# Patient Record
Sex: Male | Born: 1945 | Race: White | Hispanic: No | Smoking: Former smoker
Health system: Southern US, Community
[De-identification: ages and names within clinical notes are randomized; demographics above are authoritative.]

## PROBLEM LIST (undated history)

## (undated) DIAGNOSIS — I6521 Occlusion and stenosis of right carotid artery: Secondary | ICD-10-CM

## (undated) DIAGNOSIS — E785 Hyperlipidemia, unspecified: Secondary | ICD-10-CM

## (undated) DIAGNOSIS — E079 Disorder of thyroid, unspecified: Secondary | ICD-10-CM

## (undated) DIAGNOSIS — E039 Hypothyroidism, unspecified: Secondary | ICD-10-CM

## (undated) DIAGNOSIS — C801 Malignant (primary) neoplasm, unspecified: Secondary | ICD-10-CM

## (undated) DIAGNOSIS — K219 Gastro-esophageal reflux disease without esophagitis: Secondary | ICD-10-CM

## (undated) DIAGNOSIS — K2210 Ulcer of esophagus without bleeding: Secondary | ICD-10-CM

## (undated) DIAGNOSIS — I639 Cerebral infarction, unspecified: Secondary | ICD-10-CM

## (undated) DIAGNOSIS — J45909 Unspecified asthma, uncomplicated: Secondary | ICD-10-CM

## (undated) DIAGNOSIS — D649 Anemia, unspecified: Secondary | ICD-10-CM

## (undated) HISTORY — DX: Unspecified asthma, uncomplicated: J45.909

## (undated) HISTORY — DX: Hyperlipidemia, unspecified: E78.5

## (undated) HISTORY — DX: Hypothyroidism, unspecified: E03.9

## (undated) HISTORY — DX: Ulcer of esophagus without bleeding: K22.10

## (undated) HISTORY — PX: FRACTURE SURGERY: SHX138

## (undated) HISTORY — DX: Gastro-esophageal reflux disease without esophagitis: K21.9

## (undated) HISTORY — DX: Occlusion and stenosis of right carotid artery: I65.21

## (undated) HISTORY — DX: Disorder of thyroid, unspecified: E07.9

---

## 2001-04-19 ENCOUNTER — Emergency Department (HOSPITAL_COMMUNITY): Admission: EM | Admit: 2001-04-19 | Discharge: 2001-04-19 | Payer: Self-pay | Admitting: Emergency Medicine

## 2001-04-28 ENCOUNTER — Emergency Department (HOSPITAL_COMMUNITY): Admission: EM | Admit: 2001-04-28 | Discharge: 2001-04-28 | Payer: Self-pay | Admitting: Emergency Medicine

## 2007-10-19 ENCOUNTER — Emergency Department (HOSPITAL_COMMUNITY): Admission: EM | Admit: 2007-10-19 | Discharge: 2007-10-19 | Payer: Self-pay | Admitting: Emergency Medicine

## 2009-04-01 ENCOUNTER — Emergency Department (HOSPITAL_COMMUNITY): Admission: EM | Admit: 2009-04-01 | Discharge: 2009-04-01 | Payer: Self-pay | Admitting: Emergency Medicine

## 2011-01-22 LAB — POCT I-STAT, CHEM 8
BUN: 8 mg/dL (ref 6–23)
Calcium, Ion: 1.09 mmol/L — ABNORMAL LOW (ref 1.12–1.32)
Chloride: 103 mEq/L (ref 96–112)
Creatinine, Ser: 1 mg/dL (ref 0.4–1.5)
Glucose, Bld: 85 mg/dL (ref 70–99)
TCO2: 22 mmol/L (ref 0–100)

## 2011-01-22 LAB — D-DIMER, QUANTITATIVE: D-Dimer, Quant: <0.22 ug/mL-FEU (ref 0.00–0.48)

## 2011-07-05 LAB — CBC
MCHC: 35.9
MCV: 96.4
RDW: 12.4

## 2011-07-05 LAB — BASIC METABOLIC PANEL
BUN: 21
CO2: 25
Calcium: 8.8
Chloride: 92 — ABNORMAL LOW
Creatinine, Ser: 1.33
GFR calc Af Amer: >60
Glucose, Bld: 98

## 2011-07-05 LAB — DIFFERENTIAL
Basophils Absolute: 0
Basophils Relative: 0
Blasts: 0
Lymphocytes Relative: 36
Lymphs Abs: 1.4
Myelocytes: 0
Neutro Abs: 1.9
Neutrophils Relative %: 51
Promyelocytes Absolute: 0

## 2012-09-03 ENCOUNTER — Ambulatory Visit
Admission: RE | Admit: 2012-09-03 | Discharge: 2012-09-03 | Disposition: A | Discharge status: Home / Self Care | Payer: BC Managed Care – PPO | Source: Ambulatory Visit | Attending: Physician Assistant | Admitting: Physician Assistant

## 2012-09-03 ENCOUNTER — Other Ambulatory Visit: Payer: Self-pay | Admitting: Physician Assistant

## 2012-09-03 DIAGNOSIS — R52 Pain, unspecified: Secondary | ICD-10-CM

## 2013-09-22 ENCOUNTER — Ambulatory Visit (INDEPENDENT_AMBULATORY_CARE_PROVIDER_SITE_OTHER): Payer: BC Managed Care – PPO | Admitting: Family Medicine

## 2013-09-22 ENCOUNTER — Encounter: Payer: Self-pay | Admitting: Family Medicine

## 2013-09-22 VITALS — BP 110/82 | HR 68 | Temp 97.7°F | Resp 18 | Ht 63.0 in | Wt 124.0 lb

## 2013-09-22 DIAGNOSIS — K219 Gastro-esophageal reflux disease without esophagitis: Secondary | ICD-10-CM

## 2013-09-22 DIAGNOSIS — J42 Unspecified chronic bronchitis: Secondary | ICD-10-CM | POA: Insufficient documentation

## 2013-09-22 DIAGNOSIS — J45901 Unspecified asthma with (acute) exacerbation: Secondary | ICD-10-CM

## 2013-09-22 MED ORDER — METHYLPREDNISOLONE ACETATE 40 MG/ML IJ SUSP
40.0000 mg | Freq: Once | INTRAMUSCULAR | Status: AC
  Administered 2013-09-22: 40 mg via INTRAMUSCULAR

## 2013-09-22 MED ORDER — PREDNISONE 20 MG PO TABS: ORAL_TABLET | ORAL | Status: DC

## 2013-09-22 MED ORDER — ESOMEPRAZOLE MAGNESIUM 40 MG PO CPDR: 40.0000 mg | DELAYED_RELEASE_CAPSULE | Freq: Every day | ORAL | Status: DC

## 2013-09-22 MED ORDER — AZITHROMYCIN 250 MG PO TABS: ORAL_TABLET | ORAL | Status: DC

## 2013-09-22 MED ORDER — ALBUTEROL SULFATE HFA 108 (90 BASE) MCG/ACT IN AERS: 2.0000 | INHALATION_SPRAY | RESPIRATORY_TRACT | Status: DC | PRN

## 2013-09-22 NOTE — Progress Notes (Signed)
   Subjective:    Patient ID: Lee Wilson, male    DOB: 03-31-1946, 67 y.o.   MRN: [id number]  HPI  Patient here with cough and congestion for the past 3 weeks. He has a history of asthma. He quit smoking greater than 40 years ago. In the past he had an albuterol inhaler but has not had in the past couple of years. He denies any fever or shortness of breath.  He also has a history of acid reflux and asked that the purple pill be refilled for him. He is history of a hiatal hernia.   Review of Systems  GEN- denies fatigue, fever, weight loss,weakness, recent illness HEENT- denies eye drainage, change in vision, nasal discharge, CVS- denies chest pain, palpitations RESP- denies SOB, +cough,+ wheeze ABD- denies N/V, change in stools, abd pain GU- denies dysuria, hematuria, dribbling, incontinence      Objective:   Physical Exam  GEN- NAD, alert and oriented x3 HEENT- PERRL, EOMI, non injected sclera, pink conjunctiva, MMM, oropharynx clear Neck- Supple, no LAD CVS- RRR, no murmur RESP-+ bronchospasm,+wheeze, +ronchi bilat, normal WOB, harsh cough, oxygen sat 98%, no retractions ABD-NABS,soft,NT,ND EXT- No edema Pulses- Radial 2+  S/P neb- improved bronchospasm, good air movement       Assessment & Plan:

## 2013-09-22 NOTE — Addendum Note (Signed)
Addended by: Elvina Mattes T on: 09/22/2013 02:11 PM   Modules accepted: Orders

## 2013-09-22 NOTE — Assessment & Plan Note (Signed)
Nexium prescription given

## 2013-09-22 NOTE — Patient Instructions (Signed)
Start antibiotics today Start prednisone tomorrow Use inhaler every 4 hours  Note for work Nexium for the acid reflux Return if you do not improve I recommend you schedule a physical for 2015

## 2013-09-22 NOTE — Assessment & Plan Note (Signed)
Given Depo-Medrol IM injection. Status post neb improved in the clinic. He has normal oxygen sats. I will send him home with prednisone, azithromycin and albuterol inhaler.

## 2013-10-28 ENCOUNTER — Encounter: Payer: BC Managed Care – PPO | Admitting: Family Medicine

## 2013-11-24 ENCOUNTER — Encounter: Payer: Self-pay | Admitting: Family Medicine

## 2013-11-24 ENCOUNTER — Ambulatory Visit (INDEPENDENT_AMBULATORY_CARE_PROVIDER_SITE_OTHER): Payer: BC Managed Care – PPO | Admitting: Family Medicine

## 2013-11-24 VITALS — BP 100/82 | HR 64 | Temp 97.4°F | Resp 18 | Ht 63.0 in | Wt 128.0 lb

## 2013-11-24 DIAGNOSIS — Z0000 Encounter for general adult medical examination without abnormal findings: Secondary | ICD-10-CM

## 2013-11-24 DIAGNOSIS — Z125 Encounter for screening for malignant neoplasm of prostate: Secondary | ICD-10-CM

## 2013-11-24 LAB — CBC WITH DIFFERENTIAL/PLATELET
BASOS ABS: 0 10*3/uL (ref 0.0–0.1)
BASOS PCT: 1 % (ref 0–1)
EOS ABS: 0.2 10*3/uL (ref 0.0–0.7)
EOS PCT: 3 % (ref 0–5)
HEMATOCRIT: 41.3 % (ref 39.0–52.0)
Hemoglobin: 14.3 g/dL (ref 13.0–17.0)
Lymphocytes Relative: 44 % (ref 12–46)
Lymphs Abs: 2.2 10*3/uL (ref 0.7–4.0)
MCH: 35 pg — ABNORMAL HIGH (ref 26.0–34.0)
MCHC: 34.6 g/dL (ref 30.0–36.0)
MCV: 101 fL — ABNORMAL HIGH (ref 78.0–100.0)
MONO ABS: 0.6 10*3/uL (ref 0.1–1.0)
Monocytes Relative: 12 % (ref 3–12)
NEUTROS ABS: 2.1 10*3/uL (ref 1.7–7.7)
Neutrophils Relative %: 40 % — ABNORMAL LOW (ref 43–77)
Platelets: 220 10*3/uL (ref 150–400)
RBC: 4.09 MIL/uL — ABNORMAL LOW (ref 4.22–5.81)
RDW: 14.1 % (ref 11.5–15.5)
WBC: 5.1 10*3/uL (ref 4.0–10.5)

## 2013-11-24 LAB — LIPID PANEL
CHOL/HDL RATIO: 2.9 ratio
Cholesterol: 216 mg/dL — ABNORMAL HIGH (ref 0–200)
HDL: 74 mg/dL (ref >39)
LDL CALC: 119 mg/dL — AB (ref 0–99)
Triglycerides: 114 mg/dL (ref <150)
VLDL: 23 mg/dL (ref 0–40)

## 2013-11-24 LAB — COMPREHENSIVE METABOLIC PANEL
ALK PHOS: 49 U/L (ref 39–117)
ALT: 13 U/L (ref 0–53)
AST: 22 U/L (ref 0–37)
Albumin: 4 g/dL (ref 3.5–5.2)
BILIRUBIN TOTAL: 0.4 mg/dL (ref 0.2–1.2)
BUN: 9 mg/dL (ref 6–23)
CO2: 25 mEq/L (ref 19–32)
CREATININE: 0.82 mg/dL (ref 0.50–1.35)
Calcium: 9 mg/dL (ref 8.4–10.5)
Chloride: 100 mEq/L (ref 96–112)
Glucose, Bld: 80 mg/dL (ref 70–99)
POTASSIUM: 4.6 meq/L (ref 3.5–5.3)
Sodium: 137 mEq/L (ref 135–145)
Total Protein: 7.1 g/dL (ref 6.0–8.3)

## 2013-11-24 LAB — PSA: PSA: 0.86 ng/mL (ref <=4.00)

## 2013-11-24 MED ORDER — ALBUTEROL SULFATE HFA 108 (90 BASE) MCG/ACT IN AERS: 2.0000 | INHALATION_SPRAY | RESPIRATORY_TRACT | Status: DC | PRN

## 2013-11-24 NOTE — Progress Notes (Signed)
Patient ID: Lee Wilson, male   DOB: 02-20-1946, 68 y.o.   MRN: [id number]   Subjective:    Patient ID: Lee Wilson, male    DOB: 1946/04/17, 68 y.o.   MRN: [id number]  Patient presents for Annual Exam  he has no specific concerns. He was recently seen for asthmatic bronchitis exacerbation which is now improved. He rarely uses the albuterol. He declines colonoscopy as well as immunizations  He agrees to fasting blood work today. His history was reviewed and there've been no changes.    Review Of Systems:  GEN- denies fatigue, fever, weight loss,weakness, recent illness HEENT- denies eye drainage, change in vision, nasal discharge, CVS- denies chest pain, palpitations RESP- denies SOB, cough, wheeze ABD- denies N/V, change in stools, abd pain GU- denies dysuria, hematuria, dribbling, incontinence MSK- denies joint pain, muscle aches, injury Neuro- denies headache, dizziness, syncope, seizure activity       Objective:    BP 100/82  Pulse 64  Temp(Src) 97.4 F (36.3 C) (Oral)  Resp 18  Ht 5\' 3"  (1.6 m)  Wt 128 lb (58.06 kg)  BMI 22.68 kg/m2  SpO2 97% GEN- NAD, alert and oriented x3 HEENT- PERRL, EOMI, non injected sclera, pink conjunctiva, MMM, oropharynx clear, TM clear bilat no effusion Neck- Supple, CVS- RRR, no murmur RESP-CTAB ABD-NABS,soft,NT,ND GU- rectum normal tone, no external lesions, FOBT NEG, prostate smooth mild enlargement no nodules EXT- No edema Pulses- Radial, DP- 2+        Assessment & Plan:      Problem List Items Addressed This Visit   None    Visit Diagnoses   Routine general medical examination at a health care facility    -  Primary . Patient declines colonoscopy but agrees to do the stool cards. His fasting labs were done today. We did discuss PSA testing and he agreed to proceed. He declined any immunizations.he states that he prefers to keep things simple and if he gets sick he will come in     Relevant Orders       CBC with  Differential       Comprehensive metabolic panel       Lipid panel    Prostate cancer screening        Relevant Orders       PSA       Note: This dictation was prepared with Dragon dictation along with smaller phrase technology. Any transcriptional errors that result from this process are unintentional.

## 2013-11-24 NOTE — Patient Instructions (Signed)
F/u AS NEEDED or in 1 year We will send a letter with lab results Bring the stools cards back

## 2013-11-26 ENCOUNTER — Encounter: Payer: Self-pay | Admitting: *Deleted

## 2015-01-13 ENCOUNTER — Telehealth: Payer: Self-pay | Admitting: *Deleted

## 2015-01-13 MED ORDER — ESOMEPRAZOLE MAGNESIUM 40 MG PO CPDR
40.0000 mg | DELAYED_RELEASE_CAPSULE | Freq: Every day | ORAL | Status: DC
Start: 2015-01-13 — End: 2015-01-14

## 2015-01-13 MED ORDER — ALBUTEROL SULFATE HFA 108 (90 BASE) MCG/ACT IN AERS: 2.0000 | INHALATION_SPRAY | RESPIRATORY_TRACT | Status: DC | PRN

## 2015-01-13 NOTE — Telephone Encounter (Signed)
Received call from patient sister in law.   Reports that patient has a lot of nasal congestion and cough. States that he is having some wheezing. Reports that he does not have inhaler.   Prescription sent to pharmacy.   Appointment scheduled.

## 2015-01-14 ENCOUNTER — Ambulatory Visit (INDEPENDENT_AMBULATORY_CARE_PROVIDER_SITE_OTHER): Payer: BLUE CROSS/BLUE SHIELD | Admitting: Family Medicine

## 2015-01-14 ENCOUNTER — Encounter: Payer: Self-pay | Admitting: Family Medicine

## 2015-01-14 VITALS — BP 110/68 | HR 80 | Temp 97.7°F | Resp 18 | Wt 124.0 lb

## 2015-01-14 DIAGNOSIS — Z23 Encounter for immunization: Secondary | ICD-10-CM

## 2015-01-14 DIAGNOSIS — Z0000 Encounter for general adult medical examination without abnormal findings: Secondary | ICD-10-CM

## 2015-01-14 DIAGNOSIS — J449 Chronic obstructive pulmonary disease, unspecified: Secondary | ICD-10-CM

## 2015-01-14 LAB — CBC WITH DIFFERENTIAL/PLATELET
BASOS ABS: 0.1 10*3/uL (ref 0.0–0.1)
BASOS PCT: 1 % (ref 0–1)
EOS ABS: 0.2 10*3/uL (ref 0.0–0.7)
Eosinophils Relative: 3 % (ref 0–5)
HCT: 39.2 % (ref 39.0–52.0)
Hemoglobin: 13.7 g/dL (ref 13.0–17.0)
LYMPHS PCT: 46 % (ref 12–46)
Lymphs Abs: 2.3 10*3/uL (ref 0.7–4.0)
MCH: 35 pg — AB (ref 26.0–34.0)
MCHC: 34.9 g/dL (ref 30.0–36.0)
MCV: 100.3 fL — ABNORMAL HIGH (ref 78.0–100.0)
MPV: 9 fL (ref 8.6–12.4)
Monocytes Absolute: 0.5 10*3/uL (ref 0.1–1.0)
Monocytes Relative: 10 % (ref 3–12)
NEUTROS ABS: 2 10*3/uL (ref 1.7–7.7)
Neutrophils Relative %: 40 % — ABNORMAL LOW (ref 43–77)
PLATELETS: 207 10*3/uL (ref 150–400)
RBC: 3.91 MIL/uL — AB (ref 4.22–5.81)
RDW: 13.4 % (ref 11.5–15.5)
WBC: 5 10*3/uL (ref 4.0–10.5)

## 2015-01-14 MED ORDER — ESOMEPRAZOLE MAGNESIUM 40 MG PO CPDR: 40.0000 mg | DELAYED_RELEASE_CAPSULE | Freq: Every day | ORAL | Status: DC

## 2015-01-14 MED ORDER — ALBUTEROL SULFATE HFA 108 (90 BASE) MCG/ACT IN AERS: 2.0000 | INHALATION_SPRAY | RESPIRATORY_TRACT | Status: DC | PRN

## 2015-01-14 NOTE — Progress Notes (Signed)
   Subjective:    Patient ID: Lee Wilson, male    DOB: 10-11-1946, 69 y.o.   MRN: [id number]  HPI Here today only because he wants a refill on his albuterol. We would not refill the patient's medication without him being seen. He denies any complaints. He uses the albuterol to 3 times per month. He responds rapidly to the albuterol. He does have a history of asthma. He also has a history of tobacco abuse. He has quit smoking. Patient also takes Nexium as needed for acid reflux. His symptoms are well controlled. The remainder of his review of systems is negative. He denies any chest pain or shortness of breath or dyspnea on exertion. He denies any nausea vomiting or diarrhea. He denies any melena. He is overdue for a prostate exam. He is overdue for a colonoscopy. He refuses a colonoscopy. He refuses a digital rectal exam. He will consent to fasting lab work. He is willing to allow me to check his PSA. He is also due for vaccinations. He is very hesitant but eventually agrees to allow me to give him Pneumovax 23   Past Medical History  Diagnosis Date  . Asthma   . GERD (gastroesophageal reflux disease)    Past Surgical History  Procedure Laterality Date  . Fracture surgery     No current outpatient prescriptions on file prior to visit.   No current facility-administered medications on file prior to visit.   No Known Allergies History   Social History  . Marital Status: Married    Spouse Name: N/A  . Number of Children: N/A  . Years of Education: N/A   Occupational History  . Not on file.   Social History Main Topics  . Smoking status: Former Research scientist (life sciences)  . Smokeless tobacco: Not on file  . Alcohol Use: Not on file  . Drug Use: Not on file  . Sexual Activity: Not on file   Other Topics Concern  . Not on file   Social History Narrative    Review of Systems  All other systems reviewed and are negative.      Objective:   Physical Exam  Constitutional: He appears  well-developed and well-nourished.  Eyes: Conjunctivae are normal. No scleral icterus.  Neck: Neck supple. No JVD present. No thyromegaly present.  Cardiovascular: Normal rate, regular rhythm and normal heart sounds.   Pulmonary/Chest: Effort normal. He has wheezes.  Abdominal: Soft. Bowel sounds are normal. He exhibits no distension. There is no tenderness. There is no rebound.  Musculoskeletal: He exhibits no edema.  Lymphadenopathy:    He has no cervical adenopathy.  Vitals reviewed.         Assessment & Plan:  Routine general medical examination at a health care facility - Plan: CBC with Differential/Platelet, COMPLETE METABOLIC PANEL WITH GFR, PSA, Medicare, Lipid panel, Pneumococcal polysaccharide vaccine 23-valent greater than or equal to 2yo subcutaneous/IM  Need for prophylactic vaccination against Streptococcus pneumoniae (pneumococcus) - Plan: Pneumococcal polysaccharide vaccine 23-valent greater than or equal to 2yo subcutaneous/IM  Chronic obstructive airway disease with asthma  Patient refuses a digital rectal exam. He will allow me to check a PSA. I will also check a CBC, CMP, fasting lipid panel. Also gave the patient Pneumovax 23. Patient refuses a colonoscopy. I did refill his albuterol as requested. Currently his asthma is adequately controlled and he uses the albuterol relatively infrequently.

## 2015-01-15 LAB — COMPLETE METABOLIC PANEL WITH GFR
ALT: 14 U/L (ref 0–53)
AST: 29 U/L (ref 0–37)
Albumin: 4.2 g/dL (ref 3.5–5.2)
Alkaline Phosphatase: 59 U/L (ref 39–117)
BILIRUBIN TOTAL: 0.5 mg/dL (ref 0.2–1.2)
BUN: 5 mg/dL — ABNORMAL LOW (ref 6–23)
CO2: 23 meq/L (ref 19–32)
CREATININE: 0.77 mg/dL (ref 0.50–1.35)
Calcium: 9.1 mg/dL (ref 8.4–10.5)
Chloride: 93 mEq/L — ABNORMAL LOW (ref 96–112)
GLUCOSE: 85 mg/dL (ref 70–99)
Potassium: 4 mEq/L (ref 3.5–5.3)
SODIUM: 129 meq/L — AB (ref 135–145)
Total Protein: 7.5 g/dL (ref 6.0–8.3)

## 2015-01-15 LAB — PSA, MEDICARE: PSA: 1.13 ng/mL (ref <=4.00)

## 2015-01-15 LAB — LIPID PANEL
CHOL/HDL RATIO: 1.9 ratio
Cholesterol: 180 mg/dL (ref 0–200)
HDL: 94 mg/dL (ref >=40)
LDL Cholesterol: 72 mg/dL (ref 0–99)
Triglycerides: 71 mg/dL (ref <150)
VLDL: 14 mg/dL (ref 0–40)

## 2015-02-23 ENCOUNTER — Encounter: Payer: Self-pay | Admitting: Family Medicine

## 2015-07-12 ENCOUNTER — Ambulatory Visit (INDEPENDENT_AMBULATORY_CARE_PROVIDER_SITE_OTHER): Payer: BLUE CROSS/BLUE SHIELD | Admitting: Physician Assistant

## 2015-07-12 VITALS — BP 108/70 | HR 74 | Temp 98.5°F | Resp 16 | Ht 63.0 in | Wt 125.2 lb

## 2015-07-12 DIAGNOSIS — L03119 Cellulitis of unspecified part of limb: Secondary | ICD-10-CM

## 2015-07-12 DIAGNOSIS — M25522 Pain in left elbow: Secondary | ICD-10-CM | POA: Diagnosis not present

## 2015-07-12 MED ORDER — DOXYCYCLINE HYCLATE 100 MG PO TABS: 100.0000 mg | ORAL_TABLET | Freq: Two times a day (BID) | ORAL | Status: DC

## 2015-07-12 NOTE — Progress Notes (Signed)
   Lee Wilson  MRN: [id number] DOB: 29-Jun-1946  Subjective:  Pt presents to clinic with elbow injury that happened 2 weeks ago but now the elbow is red and swollen and has pus coming from it.  He fell and scraped his elbow on a concrete surface about 2 weeks ago and thought it was going to get better but it started to swell more and become more painful.  Then a few days ago it got a "goose egg" on it and then it popped and yellow pus came out of it.  He has done nothing to the area but keep it covered.  It has not seemed to get better since it popped a few days ago.  Patient Active Problem List   Diagnosis Date Noted  . Asthmatic bronchitis with exacerbation 09/22/2013  . GERD (gastroesophageal reflux disease) 09/22/2013    Current Outpatient Prescriptions on File Prior to Visit  Medication Sig Dispense Refill  . esomeprazole (NEXIUM) 40 MG capsule Take 1 capsule (40 mg total) by mouth daily. 30 capsule 3  . albuterol (PROVENTIL HFA;VENTOLIN HFA) 108 (90 BASE) MCG/ACT inhaler Inhale 2 puffs into the lungs every 4 (four) hours as needed for wheezing or shortness of breath. (Patient not taking: Reported on 07/12/2015) 1 Inhaler 3   No current facility-administered medications on file prior to visit.    No Known Allergies  Review of Systems  Constitutional: Negative for fever and chills.  Skin: Positive for wound.   Objective:  BP 108/70 mmHg  Pulse 74  Temp(Src) 98.5 F (36.9 C) (Oral)  Resp 16  Ht 5\' 3"  (1.6 m)  Wt 125 lb 3.2 oz (56.79 kg)  BMI 22.18 kg/m2  SpO2 96%  Physical Exam  Constitutional: He is oriented to person, place, and time and well-developed, well-nourished, and in no distress.  HENT:  Head: Normocephalic and atraumatic.  Right Ear: External ear normal.  Left Ear: External ear normal.  Eyes: Conjunctivae are normal.  Neck: Normal range of motion.  Pulmonary/Chest: Effort normal.  Neurological: He is alert and oriented to person, place, and time. Gait  normal.  Skin: Skin is warm and dry.     Psychiatric: Mood, memory, affect and judgment normal.    Assessment and Plan :  Cellulitis of elbow - Plan: Wound culture, doxycycline (VIBRA-TABS) 100 MG tablet  Elbow pain, left   warm compresses, tylenol motrin prn - recheck in 72h if not better - keep covered - finish all abx  Windell Hummingbird PA-C  [facility] 07/12/2015 8:41 AM

## 2015-07-12 NOTE — Patient Instructions (Signed)
Warm compresses to help pull out the pus.  Take your antibiotic 2 times a day until they are all gone.

## 2015-07-14 LAB — WOUND CULTURE: Gram Stain: NONE SEEN

## 2016-01-12 ENCOUNTER — Ambulatory Visit (INDEPENDENT_AMBULATORY_CARE_PROVIDER_SITE_OTHER): Payer: Medicare Other | Admitting: Physician Assistant

## 2016-01-12 ENCOUNTER — Encounter: Payer: Self-pay | Admitting: Physician Assistant

## 2016-01-12 VITALS — BP 126/76 | HR 60 | Temp 97.9°F | Resp 20 | Wt 123.0 lb

## 2016-01-12 DIAGNOSIS — J309 Allergic rhinitis, unspecified: Secondary | ICD-10-CM

## 2016-01-12 DIAGNOSIS — J45901 Unspecified asthma with (acute) exacerbation: Secondary | ICD-10-CM | POA: Diagnosis not present

## 2016-01-12 MED ORDER — ESOMEPRAZOLE MAGNESIUM 40 MG PO CPDR: 40.0000 mg | DELAYED_RELEASE_CAPSULE | Freq: Every day | ORAL | Status: DC

## 2016-01-12 MED ORDER — ALBUTEROL SULFATE HFA 108 (90 BASE) MCG/ACT IN AERS: 2.0000 | INHALATION_SPRAY | RESPIRATORY_TRACT | Status: DC | PRN

## 2016-01-12 MED ORDER — CETIRIZINE HCL 10 MG PO TABS: 10.0000 mg | ORAL_TABLET | Freq: Every day | ORAL | Status: DC

## 2016-01-12 MED ORDER — PREDNISONE 20 MG PO TABS: 20.0000 mg | ORAL_TABLET | Freq: Every day | ORAL | Status: DC

## 2016-01-12 NOTE — Progress Notes (Signed)
Patient ID: Lee Wilson MRN: [id number], DOB: February 09, 1946, 70 y.o. Date of Encounter: 01/12/2016, 10:37 AM    Chief Complaint:  Chief Complaint  Patient presents with  . prob with asthma x 3 weeks    out of MDI     HPI: 70 y.o. year old male presents with above.   States that he has coughed up no phlegm at all. States that he does not feel congestion in his head or nose. Says that just water runs out of his nose. Says that if he is in certain places he has sneezing. Says that he has been having cough. Says that he has been out of his inhaler for a year and has had no inhaler for at least a year. Feels like he is wheezing at times. As had no fevers or chills. No sore throat. No other complaints or concerns.     Home Meds:   Outpatient Prescriptions Prior to Visit  Medication Sig Dispense Refill  . albuterol (PROVENTIL HFA;VENTOLIN HFA) 108 (90 BASE) MCG/ACT inhaler Inhale 2 puffs into the lungs every 4 (four) hours as needed for wheezing or shortness of breath. (Patient not taking: Reported on 01/12/2016) 1 Inhaler 3  . doxycycline (VIBRA-TABS) 100 MG tablet Take 1 tablet (100 mg total) by mouth 2 (two) times daily. 20 tablet 0  . esomeprazole (NEXIUM) 40 MG capsule Take 1 capsule (40 mg total) by mouth daily. (Patient not taking: Reported on 01/12/2016) 30 capsule 3   No facility-administered medications prior to visit.    Allergies: No Known Allergies    Review of Systems: See HPI for pertinent ROS. All other ROS negative.    Physical Exam: Blood pressure 126/76, pulse 60, temperature 97.9 F (36.6 C), temperature source Oral, resp. rate 20, weight 123 lb (55.792 kg), SpO2 98 %., Body mass index is 21.79 kg/(m^2). General:  Thin WM. Appears in no acute distress. HEENT: Normocephalic, atraumatic, eyes without discharge, sclera non-icteric, nares are without discharge. Bilateral auditory canals clear, TM's are without perforation, pearly grey and translucent with  reflective cone of light bilaterally. Oral cavity moist, posterior pharynx without exudate, erythema, peritonsillar abscess.  Neck: Supple. No thyromegaly. No lymphadenopathy. Lungs: Very faint/very slight wheeze at left upper lung. Otherwise lungs are clear with good breath sounds and good air movement throughout. Oxygen saturation 98% on room air. Heart: Regular rhythm. No murmurs, rubs, or gallops. Msk:  Strength and tone normal for age. Extremities/Skin: Warm and dry.  Neuro: Alert and oriented X 3. Moves all extremities spontaneously. Gait is normal. CNII-XII grossly in tact. Psych:  Responds to questions appropriately with a normal affect.     ASSESSMENT AND PLAN:  70 y.o. year old male with  1. Asthma with exacerbation, unspecified asthma severity - albuterol (PROVENTIL HFA;VENTOLIN HFA) 108 (90 Base) MCG/ACT inhaler; Inhale 2 puffs into the lungs every 4 (four) hours as needed for wheezing or shortness of breath.  Dispense: 1 Inhaler; Refill: 3 - predniSONE (DELTASONE) 20 MG tablet; Take 1 tablet (20 mg total) by mouth daily with breakfast.  Dispense: 5 tablet; Refill: 0  2. Allergic rhinitis, unspecified allergic rhinitis type - cetirizine (ZYRTEC) 10 MG tablet; Take 1 tablet (10 mg total) by mouth daily.  Dispense: 30 tablet; Refill: 11  He is to take Zyrtec daily right now and use throughout the year when having an allergic rhinitis symptoms. He is to take the prednisone 20 mg daily for 5 days. He is to use the albuterol as  directed above. He is to follow-up with [place] if symptoms worsen or if symptoms do not return to normal baseline in one week.   Signed, [address] [place], [place], [facility] 01/12/2016 10:37 AM

## 2016-07-15 DIAGNOSIS — S36029A Unspecified contusion of spleen, initial encounter: Secondary | ICD-10-CM | POA: Diagnosis not present

## 2016-07-15 DIAGNOSIS — M79622 Pain in left upper arm: Secondary | ICD-10-CM | POA: Diagnosis not present

## 2016-07-15 DIAGNOSIS — I6501 Occlusion and stenosis of right vertebral artery: Secondary | ICD-10-CM | POA: Diagnosis not present

## 2016-07-15 DIAGNOSIS — T148XXA Other injury of unspecified body region, initial encounter: Secondary | ICD-10-CM | POA: Diagnosis not present

## 2016-07-15 DIAGNOSIS — M546 Pain in thoracic spine: Secondary | ICD-10-CM | POA: Diagnosis not present

## 2016-07-15 DIAGNOSIS — S2232XA Fracture of one rib, left side, initial encounter for closed fracture: Secondary | ICD-10-CM | POA: Diagnosis not present

## 2016-07-15 DIAGNOSIS — R55 Syncope and collapse: Secondary | ICD-10-CM | POA: Diagnosis not present

## 2016-07-15 DIAGNOSIS — M545 Low back pain: Secondary | ICD-10-CM | POA: Diagnosis not present

## 2016-07-15 DIAGNOSIS — S12400A Unspecified displaced fracture of fifth cervical vertebra, initial encounter for closed fracture: Secondary | ICD-10-CM | POA: Diagnosis not present

## 2016-07-16 DIAGNOSIS — S12500A Unspecified displaced fracture of sixth cervical vertebra, initial encounter for closed fracture: Secondary | ICD-10-CM | POA: Insufficient documentation

## 2016-07-16 DIAGNOSIS — W1789XA Other fall from one level to another, initial encounter: Secondary | ICD-10-CM | POA: Insufficient documentation

## 2016-07-16 DIAGNOSIS — S12690A Other displaced fracture of seventh cervical vertebra, initial encounter for closed fracture: Secondary | ICD-10-CM | POA: Diagnosis not present

## 2016-07-16 DIAGNOSIS — S36029A Unspecified contusion of spleen, initial encounter: Secondary | ICD-10-CM | POA: Insufficient documentation

## 2016-07-16 DIAGNOSIS — S22019A Unspecified fracture of first thoracic vertebra, initial encounter for closed fracture: Secondary | ICD-10-CM | POA: Insufficient documentation

## 2016-07-16 DIAGNOSIS — S36039A Unspecified laceration of spleen, initial encounter: Secondary | ICD-10-CM | POA: Diagnosis not present

## 2016-07-16 DIAGNOSIS — S12600A Unspecified displaced fracture of seventh cervical vertebra, initial encounter for closed fracture: Secondary | ICD-10-CM | POA: Insufficient documentation

## 2016-07-16 DIAGNOSIS — S22018A Other fracture of first thoracic vertebra, initial encounter for closed fracture: Secondary | ICD-10-CM | POA: Diagnosis not present

## 2016-07-16 DIAGNOSIS — S12590A Other displaced fracture of sixth cervical vertebra, initial encounter for closed fracture: Secondary | ICD-10-CM | POA: Diagnosis not present

## 2016-07-16 DIAGNOSIS — S12490A Other displaced fracture of fifth cervical vertebra, initial encounter for closed fracture: Secondary | ICD-10-CM | POA: Diagnosis not present

## 2016-07-16 DIAGNOSIS — F10929 Alcohol use, unspecified with intoxication, unspecified: Secondary | ICD-10-CM | POA: Insufficient documentation

## 2016-07-16 HISTORY — DX: Other fall from one level to another, initial encounter: W17.89XA

## 2016-07-25 ENCOUNTER — Ambulatory Visit (INDEPENDENT_AMBULATORY_CARE_PROVIDER_SITE_OTHER): Payer: Medicare Other | Admitting: Family Medicine

## 2016-07-25 ENCOUNTER — Encounter: Payer: Self-pay | Admitting: Family Medicine

## 2016-07-25 VITALS — BP 128/78 | HR 86 | Temp 98.5°F | Resp 14 | Ht 63.0 in | Wt 116.0 lb

## 2016-07-25 DIAGNOSIS — I679 Cerebrovascular disease, unspecified: Secondary | ICD-10-CM | POA: Diagnosis not present

## 2016-07-25 DIAGNOSIS — M899 Disorder of bone, unspecified: Secondary | ICD-10-CM | POA: Diagnosis not present

## 2016-07-25 DIAGNOSIS — S22018A Other fracture of first thoracic vertebra, initial encounter for closed fracture: Secondary | ICD-10-CM | POA: Diagnosis not present

## 2016-07-25 DIAGNOSIS — I779 Disorder of arteries and arterioles, unspecified: Secondary | ICD-10-CM

## 2016-07-25 DIAGNOSIS — K2100 Gastro-esophageal reflux disease with esophagitis, without bleeding: Secondary | ICD-10-CM

## 2016-07-25 DIAGNOSIS — S129XXD Fracture of neck, unspecified, subsequent encounter: Secondary | ICD-10-CM

## 2016-07-25 DIAGNOSIS — S36029D Unspecified contusion of spleen, subsequent encounter: Secondary | ICD-10-CM

## 2016-07-25 DIAGNOSIS — I739 Peripheral vascular disease, unspecified: Secondary | ICD-10-CM

## 2016-07-25 DIAGNOSIS — Z23 Encounter for immunization: Secondary | ICD-10-CM | POA: Diagnosis not present

## 2016-07-25 MED ORDER — DOCUSATE SODIUM 100 MG PO CAPS: 100.0000 mg | ORAL_CAPSULE | Freq: Two times a day (BID) | ORAL | 1 refills | Status: DC

## 2016-07-25 MED ORDER — ALBUTEROL SULFATE HFA 108 (90 BASE) MCG/ACT IN AERS: 2.0000 | INHALATION_SPRAY | RESPIRATORY_TRACT | 3 refills | Status: DC | PRN

## 2016-07-25 NOTE — Assessment & Plan Note (Addendum)
Multiple fractures including spleen hematoma. He is in need repeat CT scan of his abdomen to reevaluate his spleen I would say in about 4-6 weeks. He is following up with neurosurgery. Since his pain is not controlled I think this because he has not been taking as prescribed. I will change him to 7.5 mg 3 times a day he's been used the medication that he currently has said he can take 1-1/2 tablets as needed. His son is staying with him. Stool softener added

## 2016-07-25 NOTE — Patient Instructions (Addendum)
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

## 2016-07-25 NOTE — Progress Notes (Signed)
Subjective:    Patient ID: Lee Wilson, male    DOB: 05/26/1946, 70 y.o.   MRN: [id number]  Patient presents for Hospital F/U (S/P fall frm roof- spinal fx)  Patient here for hospital follow-up. He was seen at the emergency room at [facility] after he fell about 10 feet from his roof he was intoxicated ETOH 134 have mild lactic acidemia (2.2) UDS Negative. He sustained cervical fractures at C5 and C6 and endplate fracture at C7 and lamina fracture at the right of C6. He had a CT angiogram which also revealed high-grade stenosis as well as 70-80% blockage in the right carotid. ET tube head showed high left parietal scalp hematoma no evidence of any fracture no acute hemorrhage noted, it was also noted that he had an infarct in his right posterior frontal lobe indeterminate age  Other injuries include first rib fracture nondisplaced, age indeterminate compression fracture at T1 splenic subcapsular hematoma grade 2, left humeral diaphysis was incompletely evaluated but appeared to have numerous lytic lesions within it. He also had some mural thickening in his esophagus with stranding into the mediastinum concerning for possible esophagitis or other pathology.  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 He did not require any surgical intervention. He is prescribed Percocet 1 tablet every 6 hours as needed for pain he was continued on his albuterol for his asthma and omeprazole. He was given a weaning for his pain his Percocet one tablet every 6 hours for the first week then he is to spread to every 8 hours for the second week and every 12 hours for the third week he was given a prescription for 63 pills of Percocet on 07/16/2016 He is to follow-up with neurosurgery in 4 weeks he is supposed to be wearing a hard cervical collar until then. Neurosurgeon is Dr. Harl Bowie  He states that he is  in pain all day. He's only been taking the Percocet twice a day since he received it. He is unable to sleep with the neck brace. He is here today with his daughter Review Of Systems:  GEN- denies fatigue, fever, weight loss,weakness, recent illness HEENT- denies eye drainage, change in vision, nasal discharge, CVS- denies chest pain, palpitations RESP- denies SOB, cough, wheeze ABD- denies N/V, change in stools, abd pain GU- denies dysuria, hematuria, dribbling, incontinence MSK- +joint pain, muscle aches, injury Neuro- denies headache, dizziness, syncope, seizure activity       Objective:    BP 128/78 (BP Location: Left Arm, Patient Position: Sitting, Cuff Size: Normal)   Pulse 86   Temp 98.5 F (36.9 C) (Oral)   Resp 14   Ht 5\' 3"  (1.6 m)   Wt 116 lb (52.6 kg)   BMI 20.55 kg/m  GEN- NAD, alert and oriented x3 HEENT- PERRL, EOMI, non injected sclera, pink conjunctiva, MMM, oropharynx clear Neck- Supple, neck in collar  CVS- RRR, no murmur RESP-bilateral wheeze, normal WOB, no rales  ABD-NABS,soft,NT,ND EXT- No edema Pulses- Radial  2+        Assessment & Plan:      Problem List Items Addressed This Visit    T1 vertebral fracture (HCC)    Multiple fractures including spleen hematoma. He is in need repeat CT scan of his abdomen to reevaluate his spleen I would say in about 4-6 weeks. He is following up with neurosurgery. Since his pain is not controlled  I think this because he has not been taking as prescribed. I will change him to 7.5 mg 3 times a day he's been used the medication that he currently has said he can take 1-1/2 tablets as needed. His son is staying with him.      Spleen hematoma   Lytic bone lesions on xray    Needs xray of humerous in near future, evaluation for the lytic lesions seen Discussed the importance of following up with our office to ensure that he gets the proper care that he needs. Once he is cleared by surgery for his traumatic spinal  fractures we can proceed with the workup for the above.      GERD (gastroesophageal reflux disease)    Continue with his omeprazole he is going to need EGD performed with the esophageal thickening possibility of Barrett's or other malignant pathology      Relevant Medications   docusate sodium (COLACE) 100 MG capsule   Cerebrovascular disease or lesion    Unknown duration      Carotid artery disease ([place])    He will eventually need vascular consult once he is out of his neck brace. I will also have him come back in for cholesterol check and will plan to start statin drug once he's been clear from these traumatic injuries. Discussed the importance of not drinking       Other Visit Diagnoses    Closed fracture of multiple cervical vertebrae, subsequent encounter    -  Primary   Need for prophylactic vaccination against Streptococcus pneumoniae (pneumococcus)          Note: This dictation was prepared with Dragon dictation along with smaller phrase technology. Any transcriptional errors that result from this process are unintentional.

## 2016-07-25 NOTE — Assessment & Plan Note (Signed)
Needs xray of humerous in near future, evaluation for the lytic lesions seen Discussed the importance of following up with our office to ensure that he gets the proper care that he needs. Once he is cleared by surgery for his traumatic spinal fractures we can proceed with the workup for the above.

## 2016-07-25 NOTE — Assessment & Plan Note (Signed)
Unknown duration 

## 2016-07-25 NOTE — Assessment & Plan Note (Signed)
Continue with his omeprazole he is going to need EGD performed with the esophageal thickening possibility of Barrett's or other malignant pathology

## 2016-07-25 NOTE — Assessment & Plan Note (Signed)
He will eventually need vascular consult once he is out of his neck brace. I will also have him come back in for cholesterol check and will plan to start statin drug once he's been clear from these traumatic injuries. Discussed the importance of not drinking

## 2016-08-16 DIAGNOSIS — S12591D Other nondisplaced fracture of sixth cervical vertebra, subsequent encounter for fracture with routine healing: Secondary | ICD-10-CM | POA: Diagnosis not present

## 2016-08-16 DIAGNOSIS — R29898 Other symptoms and signs involving the musculoskeletal system: Secondary | ICD-10-CM | POA: Diagnosis not present

## 2016-08-16 DIAGNOSIS — S12400G Unspecified displaced fracture of fifth cervical vertebra, subsequent encounter for fracture with delayed healing: Secondary | ICD-10-CM | POA: Diagnosis not present

## 2016-08-16 DIAGNOSIS — R531 Weakness: Secondary | ICD-10-CM | POA: Diagnosis not present

## 2016-08-16 DIAGNOSIS — Z87891 Personal history of nicotine dependence: Secondary | ICD-10-CM | POA: Diagnosis not present

## 2016-08-16 DIAGNOSIS — S12490G Other displaced fracture of fifth cervical vertebra, subsequent encounter for fracture with delayed healing: Secondary | ICD-10-CM | POA: Diagnosis not present

## 2016-08-16 DIAGNOSIS — S12601G Unspecified nondisplaced fracture of seventh cervical vertebra, subsequent encounter for fracture with delayed healing: Secondary | ICD-10-CM | POA: Diagnosis not present

## 2016-08-16 DIAGNOSIS — S12500G Unspecified displaced fracture of sixth cervical vertebra, subsequent encounter for fracture with delayed healing: Secondary | ICD-10-CM | POA: Diagnosis not present

## 2016-08-22 ENCOUNTER — Ambulatory Visit: Payer: Medicare Other | Admitting: Family Medicine

## 2016-09-04 DIAGNOSIS — Z87891 Personal history of nicotine dependence: Secondary | ICD-10-CM | POA: Diagnosis not present

## 2016-09-04 DIAGNOSIS — S12490G Other displaced fracture of fifth cervical vertebra, subsequent encounter for fracture with delayed healing: Secondary | ICD-10-CM | POA: Diagnosis not present

## 2016-09-04 DIAGNOSIS — M4802 Spinal stenosis, cervical region: Secondary | ICD-10-CM | POA: Diagnosis not present

## 2016-09-04 DIAGNOSIS — R29898 Other symptoms and signs involving the musculoskeletal system: Secondary | ICD-10-CM | POA: Diagnosis not present

## 2016-09-04 DIAGNOSIS — S12500D Unspecified displaced fracture of sixth cervical vertebra, subsequent encounter for fracture with routine healing: Secondary | ICD-10-CM | POA: Diagnosis not present

## 2016-09-04 DIAGNOSIS — S129XXD Fracture of neck, unspecified, subsequent encounter: Secondary | ICD-10-CM | POA: Diagnosis not present

## 2016-09-04 DIAGNOSIS — S12601G Unspecified nondisplaced fracture of seventh cervical vertebra, subsequent encounter for fracture with delayed healing: Secondary | ICD-10-CM | POA: Diagnosis not present

## 2016-09-04 DIAGNOSIS — S12500G Unspecified displaced fracture of sixth cervical vertebra, subsequent encounter for fracture with delayed healing: Secondary | ICD-10-CM | POA: Diagnosis not present

## 2016-09-10 ENCOUNTER — Encounter: Payer: Self-pay | Admitting: Family Medicine

## 2016-09-10 ENCOUNTER — Ambulatory Visit (INDEPENDENT_AMBULATORY_CARE_PROVIDER_SITE_OTHER): Payer: Medicare Other | Admitting: Family Medicine

## 2016-09-10 VITALS — BP 122/68 | HR 94 | Temp 98.5°F | Resp 16 | Ht 63.0 in | Wt 114.0 lb

## 2016-09-10 DIAGNOSIS — Z13220 Encounter for screening for lipoid disorders: Secondary | ICD-10-CM

## 2016-09-10 DIAGNOSIS — I779 Disorder of arteries and arterioles, unspecified: Secondary | ICD-10-CM | POA: Diagnosis not present

## 2016-09-10 DIAGNOSIS — R634 Abnormal weight loss: Secondary | ICD-10-CM | POA: Diagnosis not present

## 2016-09-10 DIAGNOSIS — Z125 Encounter for screening for malignant neoplasm of prostate: Secondary | ICD-10-CM | POA: Diagnosis not present

## 2016-09-10 DIAGNOSIS — J410 Simple chronic bronchitis: Secondary | ICD-10-CM

## 2016-09-10 DIAGNOSIS — S36029D Unspecified contusion of spleen, subsequent encounter: Secondary | ICD-10-CM

## 2016-09-10 DIAGNOSIS — M899 Disorder of bone, unspecified: Secondary | ICD-10-CM

## 2016-09-10 DIAGNOSIS — I739 Peripheral vascular disease, unspecified: Secondary | ICD-10-CM

## 2016-09-10 MED ORDER — UMECLIDINIUM BROMIDE 62.5 MCG/INH IN AEPB: 1.0000 | INHALATION_SPRAY | Freq: Every day | RESPIRATORY_TRACT | 3 refills | Status: DC

## 2016-09-10 NOTE — Patient Instructions (Addendum)
Lab work to be done  CT scan to be done Xray of arm to be done  Carotid ultrasound to be done  F/U 3 months

## 2016-09-10 NOTE — Assessment & Plan Note (Signed)
Carotid ultrasound to be done bilat, check lipids Incidental finding on CT of neck

## 2016-09-10 NOTE — Progress Notes (Signed)
   Subjective:    Patient ID: Lee Wilson, male    DOB: 31-Jan-1946, 70 y.o.   MRN: [id number]  Patient presents for F/U (is fasting) Patient here for follow-up he was seen back in October after he sustained a fall from the roof. This resulted in spinal fractures. He was intoxicated. He had cervical fractures of C5-C6 as well as endplate fracture at C7 and lamina fracture of C6. He also had parietal hematoma but no fracture of the skull. He had nondisplaced rib fractures as well as compression fracture of T1 and a subcapsular hematoma of the spleen which needs repeat scanning.  It was also noted that he had left humeral lesions that need to be reevaluated and he also had high-grade stenosis of his right carotid artery  He is being followed by the trauma team at [facility]- states released last week  Review Of Systems:  GEN- denies fatigue, fever, weight loss,weakness, recent illness HEENT- denies eye drainage, change in vision, nasal discharge, CVS- denies chest pain, palpitations RESP- denies SOB, cough, wheeze ABD- denies N/V, change in stools, abd pain GU- denies dysuria, hematuria, dribbling, incontinence MSK- denies joint pain, muscle aches, injury Neuro- denies headache, dizziness, syncope, seizure activity       Objective:    BP 122/68 (BP Location: Left Arm, Patient Position: Sitting, Cuff Size: Normal)   Pulse 94   Temp 98.5 F (36.9 C) (Oral)   Resp 16   Ht 5\' 3"  (1.6 m)   Wt 114 lb (51.7 kg)   SpO2 96%   BMI 20.19 kg/m  GEN- NAD, alert and oriented x3 HEENT- PERRL, EOMI, non injected sclera, pink conjunctiva, MMM, oropharynx clear Neck- Supple,fair ROM neck, +carotid bruit right CVS- RRR, no murmur RESP-scattered wheeze ABD-NABS,soft,NT,ND, no HSM EXT- No edema Pulses- Radial  2+        Assessment & Plan:      Problem List Items Addressed This Visit    Spleen hematoma - Primary    Repeat CT abdomen, asymptomatic      Relevant Orders   CT Abdomen Pelvis Wo Contrast   Lytic bone lesions on xray    Xray of humerous to be done Check CBC, fasting labs, PSA Some weight loss over past year also noted       Relevant Orders   CBC with Differential/Platelet   DG Humerus Right   DG Humerus Left   Chronic bronchitis (HCC)    Trial of Incruse       Carotid artery disease (HCC)    Carotid ultrasound to be done bilat, check lipids Incidental finding on CT of neck      Relevant Orders   CBC with Differential/Platelet   Comprehensive metabolic panel   Lipid panel   US Carotid Duplex Bilateral    Other Visit Diagnoses    Screening PSA (prostate specific antigen)       Relevant Orders   PSA   Loss of weight       Relevant Orders   PSA   Screening cholesterol level       Relevant Orders   Lipid panel      Note: This dictation was prepared with Dragon dictation along with smaller phrase technology. Any transcriptional errors that result from this process are unintentional.

## 2016-09-10 NOTE — Assessment & Plan Note (Signed)
Repeat CT abdomen, asymptomatic

## 2016-09-10 NOTE — Assessment & Plan Note (Signed)
Xray of humerous to be done Check CBC, fasting labs, PSA Some weight loss over past year also noted

## 2016-09-10 NOTE — Assessment & Plan Note (Signed)
Trial of Incruse

## 2016-09-11 LAB — CBC WITH DIFFERENTIAL/PLATELET
Basophils Absolute: 0 cells/uL (ref 0–200)
Basophils Relative: 0 %
EOS PCT: 2 %
Eosinophils Absolute: 160 cells/uL (ref 15–500)
HEMATOCRIT: 41.7 % (ref 38.5–50.0)
HEMOGLOBIN: 14.3 g/dL (ref 13.0–17.0)
LYMPHS ABS: 2080 {cells}/uL (ref 850–3900)
Lymphocytes Relative: 26 %
MCH: 34.7 pg — ABNORMAL HIGH (ref 27.0–33.0)
MCHC: 34.3 g/dL (ref 32.0–36.0)
MCV: 101.2 fL — ABNORMAL HIGH (ref 80.0–100.0)
MONO ABS: 880 {cells}/uL (ref 200–950)
MPV: 9.2 fL (ref 7.5–12.5)
Monocytes Relative: 11 %
NEUTROS ABS: 4880 {cells}/uL (ref 1500–7800)
Neutrophils Relative %: 61 %
Platelets: 213 10*3/uL (ref 140–400)
RBC: 4.12 MIL/uL — AB (ref 4.20–5.80)
RDW: 13.8 % (ref 11.0–15.0)
WBC: 8 10*3/uL (ref 3.8–10.8)

## 2016-09-11 LAB — LIPID PANEL
CHOLESTEROL: 161 mg/dL (ref <200)
HDL: 62 mg/dL (ref >40)
LDL CALC: 86 mg/dL (ref <100)
TRIGLYCERIDES: 64 mg/dL (ref <150)
Total CHOL/HDL Ratio: 2.6 Ratio (ref <5.0)
VLDL: 13 mg/dL (ref <30)

## 2016-09-11 LAB — COMPREHENSIVE METABOLIC PANEL
ALBUMIN: 3.5 g/dL — AB (ref 3.6–5.1)
ALK PHOS: 56 U/L (ref 40–115)
ALT: 10 U/L (ref 9–46)
AST: 20 U/L (ref 10–35)
BILIRUBIN TOTAL: 0.5 mg/dL (ref 0.2–1.2)
BUN: 7 mg/dL (ref 7–25)
CO2: 26 mmol/L (ref 20–31)
CREATININE: 0.97 mg/dL (ref 0.70–1.25)
Calcium: 9.6 mg/dL (ref 8.6–10.3)
Chloride: 98 mmol/L (ref 98–110)
Glucose, Bld: 83 mg/dL (ref 70–99)
Potassium: 4.1 mmol/L (ref 3.5–5.3)
SODIUM: 133 mmol/L — AB (ref 135–146)
TOTAL PROTEIN: 7 g/dL (ref 6.1–8.1)

## 2016-09-11 LAB — PSA: PSA: 0.4 ng/mL (ref <=4.0)

## 2016-09-12 DIAGNOSIS — M4802 Spinal stenosis, cervical region: Secondary | ICD-10-CM | POA: Insufficient documentation

## 2016-09-14 ENCOUNTER — Other Ambulatory Visit: Payer: Self-pay

## 2016-09-19 ENCOUNTER — Ambulatory Visit
Admission: RE | Admit: 2016-09-19 | Discharge: 2016-09-19 | Disposition: A | Discharge status: Home / Self Care | Payer: Medicare Other | Source: Ambulatory Visit | Attending: Family Medicine | Admitting: Family Medicine

## 2016-09-19 DIAGNOSIS — I779 Disorder of arteries and arterioles, unspecified: Secondary | ICD-10-CM

## 2016-09-19 DIAGNOSIS — I739 Peripheral vascular disease, unspecified: Principal | ICD-10-CM

## 2016-09-19 DIAGNOSIS — I6523 Occlusion and stenosis of bilateral carotid arteries: Secondary | ICD-10-CM | POA: Diagnosis not present

## 2016-09-19 DIAGNOSIS — S36029D Unspecified contusion of spleen, subsequent encounter: Secondary | ICD-10-CM

## 2016-09-19 DIAGNOSIS — S3609XA Other injury of spleen, initial encounter: Secondary | ICD-10-CM | POA: Diagnosis not present

## 2016-09-21 ENCOUNTER — Other Ambulatory Visit: Payer: Self-pay | Admitting: *Deleted

## 2016-09-21 MED ORDER — SIMVASTATIN 20 MG PO TABS: 20.0000 mg | ORAL_TABLET | Freq: Every day | ORAL | 3 refills | Status: DC

## 2016-09-24 ENCOUNTER — Ambulatory Visit
Admission: RE | Admit: 2016-09-24 | Discharge: 2016-09-24 | Disposition: A | Discharge status: Home / Self Care | Payer: Medicare Other | Source: Ambulatory Visit | Attending: Family Medicine | Admitting: Family Medicine

## 2016-09-24 DIAGNOSIS — M898X9 Other specified disorders of bone, unspecified site: Secondary | ICD-10-CM

## 2016-09-24 DIAGNOSIS — M899 Disorder of bone, unspecified: Secondary | ICD-10-CM

## 2016-09-24 DIAGNOSIS — M898X2 Other specified disorders of bone, upper arm: Secondary | ICD-10-CM | POA: Diagnosis not present

## 2016-09-26 ENCOUNTER — Ambulatory Visit (INDEPENDENT_AMBULATORY_CARE_PROVIDER_SITE_OTHER): Payer: Medicare Other | Admitting: Family Medicine

## 2016-09-26 ENCOUNTER — Encounter: Payer: Self-pay | Admitting: Family Medicine

## 2016-09-26 VITALS — BP 118/72 | HR 68 | Temp 98.2°F | Resp 14 | Ht 63.0 in | Wt 113.0 lb

## 2016-09-26 DIAGNOSIS — K2100 Gastro-esophageal reflux disease with esophagitis, without bleeding: Secondary | ICD-10-CM

## 2016-09-26 DIAGNOSIS — M899 Disorder of bone, unspecified: Secondary | ICD-10-CM | POA: Diagnosis not present

## 2016-09-26 DIAGNOSIS — I779 Disorder of arteries and arterioles, unspecified: Secondary | ICD-10-CM

## 2016-09-26 DIAGNOSIS — I739 Peripheral vascular disease, unspecified: Secondary | ICD-10-CM

## 2016-09-26 MED ORDER — ESOMEPRAZOLE MAGNESIUM 40 MG PO CPDR: 40.0000 mg | DELAYED_RELEASE_CAPSULE | Freq: Every day | ORAL | 11 refills | Status: DC

## 2016-09-26 NOTE — Patient Instructions (Addendum)
Bone Scan to be done We will call with lab results Take the nexium  F/U as previous

## 2016-09-26 NOTE — Assessment & Plan Note (Signed)
Refilled nexium 

## 2016-09-26 NOTE — Assessment & Plan Note (Addendum)
Continue zocor, repeat US carotids in 1 year   Note he also asked about returning to work. It seems like the specialists at [facility] were completing his paperwork. Her last note from November and she stated that he could be released back to work. We will contact his HR department release him back to work.

## 2016-09-26 NOTE — Progress Notes (Signed)
Subjective:    Patient ID: Lee Wilson, male    DOB: 10/14/1946, 70 y.o.   MRN: [id number]  Patient presents for F/U X-Ray Results (is fasting) Patient here to discuss his recent imaging. He was in an accident back in October when he fell off a roof. He sustained multiple fractures at that time he also had splenic hematoma. He had repeat CT scan which showed resolution of the hematoma should the swelling,he did have mild esophogeal thickening, consistent with his GERD . He was also noted that he had lytic lesions which were incompletely evaluated  when he had imaging of his shoulder and chest. He had dedicated x-rays of his bilateral humerus. There is concern whether or not these lucencies seen in the humerus a possible fracture on the right side have any metabolic activity. Concern would be for possible multiple myeloma rather bone tumor.   IMPRESSION from Radiology Questionable lytic lesion in the proximal right humerus with possible subtle adjacent fracture. A whole-body bone scan with attention to the left humerus may prove useful to determine if and active process is present in the proximal humerus. ADDENDUM: Note in the body of the report it should read: A whole-body bone scan with attention to the left humerus can be obtained for further evaluation to determine if this is an active lesion.  He also had bilateral carotid arteries done as is concern for stenosis. He has 50% stenosis bilaterally. He was started on Zocor 20 mg for primary prevention of stroke. His cholesterol itself looks okay. today discussing further workup for these lesion seen on his arm.     Review Of Systems:  GEN- denies fatigue, fever, weight loss,weakness, recent illness HEENT- denies eye drainage, change in vision, nasal discharge, CVS- denies chest pain, palpitations RESP- denies SOB, cough, wheeze ABD- denies N/V, change in stools, abd pain GU- denies dysuria, hematuria, dribbling,  incontinence MSK- denies joint pain, muscle aches, injury Neuro- denies headache, dizziness, syncope, seizure activity       Objective:    BP 118/72 (BP Location: Left Arm, Patient Position: Sitting, Cuff Size: Normal)   Pulse 68   Temp 98.2 F (36.8 C) (Oral)   Resp 14   Ht 5' 3"  (1.6 m)   Wt 113 lb (51.3 kg)   SpO2 96%   BMI 20.02 kg/m  GEN- NAD, alert and oriented x3 HEENT- PERRL, EOMI, non injected sclera, pink conjunctiva, MMM, oropharynx clear Neck- Supple, fair ROM, spine NT  CVS- RRR, no murmur RESP-CTAB EXT- No edema Pulses- Radial, DP- 2+        Assessment & Plan:      Problem List Items Addressed This Visit    GERD (gastroesophageal reflux disease)    Refilled nexium       Relevant Medications   esomeprazole (NEXIUM) 40 MG capsule   Carotid artery disease ([place])    Continue zocor, repeat US carotids in 1 year   Note he also asked about returning to work. It seems like the specialists at [facility] were completing his paperwork. Her last note from November and she stated that he could be released back to work. We will contact his HR department release him back to work.       Other Visit Diagnoses    Lytic lesion of bone on x-ray    -  Primary   discussed possible bone tumor , vs Multiple myeloma, vs benign lesion, He has decided to pursue work up, will get hold body  bone scan, protein electrophoresis   Relevant Orders   Serum protein electrophoresis with reflex   NM Bone Scan Whole Body      Note: This dictation was prepared with Dragon dictation along with smaller phrase technology. Any transcriptional errors that result from this process are unintentional.

## 2016-09-28 LAB — PROTEIN ELECTROPHORESIS, SERUM, WITH REFLEX
ALBUMIN ELP: 3.6 g/dL — AB (ref 3.8–4.8)
Alpha-1-Globulin: 0.3 g/dL (ref 0.2–0.3)
Alpha-2-Globulin: 0.6 g/dL (ref 0.5–0.9)
BETA 2: 0.5 g/dL (ref 0.2–0.5)
Beta Globulin: 0.5 g/dL (ref 0.4–0.6)
GAMMA GLOBULIN: 1.5 g/dL (ref 0.8–1.7)
TOTAL PROTEIN, SERUM ELECTROPHOR: 7 g/dL (ref 6.1–8.1)

## 2016-10-01 LAB — IFE INTERPRETATION

## 2016-10-04 ENCOUNTER — Encounter (HOSPITAL_COMMUNITY)
Admission: RE | Admit: 2016-10-04 | Discharge: 2016-10-04 | Disposition: A | Discharge status: Home / Self Care | Payer: Medicare Other | Source: Ambulatory Visit | Attending: Family Medicine | Admitting: Family Medicine

## 2016-10-04 DIAGNOSIS — M899 Disorder of bone, unspecified: Secondary | ICD-10-CM | POA: Diagnosis not present

## 2016-10-04 DIAGNOSIS — R937 Abnormal findings on diagnostic imaging of other parts of musculoskeletal system: Secondary | ICD-10-CM | POA: Diagnosis not present

## 2016-10-04 MED ORDER — TECHNETIUM TC 99M MEDRONATE IV KIT
25.2000 | PACK | Freq: Once | INTRAVENOUS | Status: AC | PRN
Start: 2016-10-04 — End: 2016-10-04
  Administered 2016-10-04: 25.2 via INTRAVENOUS

## 2016-12-11 ENCOUNTER — Ambulatory Visit (INDEPENDENT_AMBULATORY_CARE_PROVIDER_SITE_OTHER): Payer: Medicare Other | Admitting: Family Medicine

## 2016-12-11 ENCOUNTER — Encounter: Payer: Self-pay | Admitting: Family Medicine

## 2016-12-11 VITALS — BP 112/70 | HR 78 | Temp 98.1°F | Resp 14 | Ht 63.0 in | Wt 122.0 lb

## 2016-12-11 DIAGNOSIS — I739 Peripheral vascular disease, unspecified: Secondary | ICD-10-CM

## 2016-12-11 DIAGNOSIS — I779 Disorder of arteries and arterioles, unspecified: Secondary | ICD-10-CM

## 2016-12-11 DIAGNOSIS — L409 Psoriasis, unspecified: Secondary | ICD-10-CM

## 2016-12-11 DIAGNOSIS — J410 Simple chronic bronchitis: Secondary | ICD-10-CM

## 2016-12-11 DIAGNOSIS — K2100 Gastro-esophageal reflux disease with esophagitis, without bleeding: Secondary | ICD-10-CM

## 2016-12-11 MED ORDER — HYDROCORTISONE 2.5 % EX CREA: TOPICAL_CREAM | Freq: Two times a day (BID) | CUTANEOUS | 2 refills | Status: DC

## 2016-12-11 MED ORDER — UMECLIDINIUM BROMIDE 62.5 MCG/INH IN AEPB: 1.0000 | INHALATION_SPRAY | Freq: Every day | RESPIRATORY_TRACT | 3 refills | Status: DC

## 2016-12-11 MED ORDER — ALBUTEROL SULFATE HFA 108 (90 BASE) MCG/ACT IN AERS: 2.0000 | INHALATION_SPRAY | RESPIRATORY_TRACT | 3 refills | Status: DC | PRN

## 2016-12-11 NOTE — Patient Instructions (Addendum)
F/U December for Physical Continue current medications  Use the cream on your face  Use the incruse once a day for your breathing

## 2016-12-11 NOTE — Progress Notes (Signed)
   Subjective:    Patient ID: Lee Wilson, male    DOB: 08-11-1946, 71 y.o.   MRN: [id number]  Patient presents for 3 month F/U (is not fasting)  Pt here to follow-up medications. He has carotid artery disease currently on statin drug.  Chronic bronchitis He was given Incruse for  Maintenance he stopped after samples ran out) and he uses albuterol as needed for rescue. He also has allergy medication.  Reflux he takes Nexium as needed  He was relesaed back to work in December, He has done fairly well at work he does get some soreness in his neck but nothing severe. Eight up 11 pounds since December 13th , states appetite is much better  Review Of Systems:  GEN- denies fatigue, fever, weight loss,weakness, recent illness HEENT- denies eye drainage, change in vision, nasal discharge, CVS- denies chest pain, palpitations RESP- denies SOB, cough, wheeze ABD- denies N/V, change in stools, abd pain GU- denies dysuria, hematuria, dribbling, incontinence MSK- denies joint pain, muscle aches, injury Neuro- denies headache, dizziness, syncope, seizure activity       Objective:    BP 112/70   Pulse 78   Temp 98.1 F (36.7 C) (Oral)   Resp 14   Ht 5\' 3"  (1.6 m)   Wt 122 lb (55.3 kg)   SpO2 97%   BMI 21.61 kg/m  GEN- NAD, alert and oriented x3 HEENT- PERRL, EOMI, non injected sclera, pink conjunctiva, MMM, oropharynx clear CVS- RRR, no murmur RESP-scattered wheeze, normal WOB ABD-NABS,soft,NT,ND Skin- scaley silverly plaques in scalp, brows, beard  EXT- No edema Pulses- Radial  2+        Assessment & Plan:      Problem List Items Addressed This Visit    GERD (gastroesophageal reflux disease)    Continue nexium      Chronic bronchitis ([place]) - Primary    Restart Incruse had benefit from this Albuterol as rescue inhaler      Carotid artery disease (HCC)    LDL at goal, continue statin Repeat carotid US in fall        Other Visit Diagnoses    Psoriasis        Noted psoriatic lesions states he has had for years, uses lotion, given hdyrocortisone 2.5% to apply      Note: This dictation was prepared with Dragon dictation along with smaller phrase technology. Any transcriptional errors that result from this process are unintentional.

## 2016-12-11 NOTE — Assessment & Plan Note (Signed)
Restart Incruse had benefit from this Albuterol as rescue inhaler

## 2016-12-11 NOTE — Assessment & Plan Note (Signed)
Continue nexium 

## 2016-12-11 NOTE — Assessment & Plan Note (Signed)
LDL at goal, continue statin Repeat carotid US in fall

## 2016-12-13 ENCOUNTER — Other Ambulatory Visit: Payer: Self-pay | Admitting: *Deleted

## 2016-12-13 MED ORDER — ALBUTEROL SULFATE HFA 108 (90 BASE) MCG/ACT IN AERS: 2.0000 | INHALATION_SPRAY | RESPIRATORY_TRACT | 3 refills | Status: DC | PRN

## 2017-05-24 ENCOUNTER — Encounter (HOSPITAL_COMMUNITY): Payer: Self-pay | Admitting: Emergency Medicine

## 2017-05-24 ENCOUNTER — Emergency Department (HOSPITAL_COMMUNITY)
Admission: EM | Admit: 2017-05-24 | Discharge: 2017-05-25 | Disposition: A | Discharge status: Home / Self Care | Payer: Medicare Other | Attending: Emergency Medicine | Admitting: Emergency Medicine

## 2017-05-24 DIAGNOSIS — I2510 Atherosclerotic heart disease of native coronary artery without angina pectoris: Secondary | ICD-10-CM | POA: Insufficient documentation

## 2017-05-24 DIAGNOSIS — S41111A Laceration without foreign body of right upper arm, initial encounter: Secondary | ICD-10-CM

## 2017-05-24 DIAGNOSIS — Y9389 Activity, other specified: Secondary | ICD-10-CM | POA: Diagnosis not present

## 2017-05-24 DIAGNOSIS — Y998 Other external cause status: Secondary | ICD-10-CM | POA: Diagnosis not present

## 2017-05-24 DIAGNOSIS — Z79899 Other long term (current) drug therapy: Secondary | ICD-10-CM | POA: Insufficient documentation

## 2017-05-24 DIAGNOSIS — J45909 Unspecified asthma, uncomplicated: Secondary | ICD-10-CM | POA: Insufficient documentation

## 2017-05-24 DIAGNOSIS — S51811A Laceration without foreign body of right forearm, initial encounter: Secondary | ICD-10-CM | POA: Diagnosis not present

## 2017-05-24 DIAGNOSIS — Z23 Encounter for immunization: Secondary | ICD-10-CM | POA: Insufficient documentation

## 2017-05-24 DIAGNOSIS — Y929 Unspecified place or not applicable: Secondary | ICD-10-CM | POA: Insufficient documentation

## 2017-05-24 DIAGNOSIS — Z87891 Personal history of nicotine dependence: Secondary | ICD-10-CM | POA: Insufficient documentation

## 2017-05-24 DIAGNOSIS — W260XXA Contact with knife, initial encounter: Secondary | ICD-10-CM | POA: Diagnosis not present

## 2017-05-24 NOTE — ED Triage Notes (Signed)
Pt presents to ED for assessment of a laceration to his right arm caused by his pocket knife.  ETOH on board.  Bleeding controlled.

## 2017-05-25 MED ORDER — SULFAMETHOXAZOLE-TRIMETHOPRIM 800-160 MG PO TABS
1.0000 | ORAL_TABLET | Freq: Once | ORAL | Status: AC
  Administered 2017-05-25: 1 via ORAL
  Filled 2017-05-25: qty 1

## 2017-05-25 MED ORDER — SULFAMETHOXAZOLE-TRIMETHOPRIM 800-160 MG PO TABS: 1.0000 | ORAL_TABLET | Freq: Two times a day (BID) | ORAL | 0 refills | Status: AC

## 2017-05-25 MED ORDER — TETANUS-DIPHTH-ACELL PERTUSSIS 5-2.5-18.5 LF-MCG/0.5 IM SUSP
0.5000 mL | Freq: Once | INTRAMUSCULAR | Status: AC
  Administered 2017-05-25: 0.5 mL via INTRAMUSCULAR
  Filled 2017-05-25: qty 0.5

## 2017-05-25 MED ORDER — LIDOCAINE HCL (PF) 1 % IJ SOLN
10.0000 mL | Freq: Once | INTRAMUSCULAR | Status: AC
  Administered 2017-05-25: 10 mL via INTRADERMAL
  Filled 2017-05-25 (×2): qty 10

## 2017-05-25 NOTE — ED Provider Notes (Signed)
[facility] DEPT Provider Note   CSN: [id number] Arrival date & time: 05/24/17  1924     History   Chief Complaint Chief Complaint  Patient presents with  . Laceration    HPI Lee Wilson is a 71 y.o. male with a hx of asthma, GERD, regular EtOH usage presents to the Emergency Department complaining of Acute, persistent laceration to the right forearm onset around 2 PM today. Patient reports that he was cutting string with a pocket knife when he cut his arm.  Pt reports he cleaned it with peroxide prior to arrival.  He denies fever, chills, purulent drainage.      The history is provided by the patient and medical records. No language interpreter was used.    Past Medical History:  Diagnosis Date  . Asthma   . GERD (gastroesophageal reflux disease)     Patient Active Problem List   Diagnosis Date Noted  . Carotid artery disease ([place]) 07/25/2016  . Cerebrovascular disease or lesion 07/25/2016  . Lytic bone lesions on xray 07/25/2016  . Alcohol intoxication ([place]) 07/16/2016  . C6 cervical fracture ([place]) 07/16/2016  . C7 cervical fracture ([place]) 07/16/2016  . Fall from height of greater than 3 feet 07/16/2016  . Spleen hematoma 07/16/2016  . T1 vertebral fracture ([place]) 07/16/2016  . Chronic bronchitis ([place]) 09/22/2013  . GERD (gastroesophageal reflux disease) 09/22/2013    Past Surgical History:  Procedure Laterality Date  . FRACTURE SURGERY         Home Medications    Prior to Admission medications   Medication Sig Start Date End Date Taking? Authorizing Provider  albuterol (PROVENTIL HFA;VENTOLIN HFA) 108 (90 Base) MCG/ACT inhaler Inhale 2 puffs into the lungs every 4 (four) hours as needed for wheezing or shortness of breath. 12/13/16   Alycia Rossetti, MD  cetirizine (ZYRTEC) 10 MG tablet Take 1 tablet (10 mg total) by mouth daily. 01/12/16   Orlena Sheldon, PA-C  esomeprazole (NEXIUM) 40 MG capsule Take 1 capsule (40 mg total) by mouth daily. 09/26/16    Alycia Rossetti, MD  hydrocortisone 2.5 % cream Apply topically 2 (two) times daily. Apply to face 12/11/16   Alycia Rossetti, MD  simvastatin (ZOCOR) 20 MG tablet Take 1 tablet (20 mg total) by mouth at bedtime. 09/21/16   [place], Modena Nunnery, MD  sulfamethoxazole-trimethoprim (BACTRIM DS,SEPTRA DS) 800-160 MG tablet Take 1 tablet by mouth 2 (two) times daily. 05/25/17 06/01/17  Johnna Bollier, Jarrett Soho, PA-C  umeclidinium bromide (INCRUSE ELLIPTA) 62.5 MCG/INH AEPB Inhale 1 puff into the lungs daily. 12/11/16   Alycia Rossetti, MD    Family History History reviewed. No pertinent family history.  Social History Social History  Substance Use Topics  . Smoking status: Former Research scientist (life sciences)  . Smokeless tobacco: Never Used  . Alcohol use 3.6 oz/week    6 Cans of beer per week     Allergies   Penicillin g   Review of Systems Review of Systems  Constitutional: Negative for fever.  Gastrointestinal: Negative for nausea and vomiting.  Skin: Positive for color change and wound.  Allergic/Immunologic: Negative for immunocompromised state.  Neurological: Negative for weakness and numbness.  Hematological: Does not bruise/bleed easily.  Psychiatric/Behavioral: The patient is not nervous/anxious.      Physical Exam Updated Vital Signs BP (!) 151/86 (BP Location: Left Arm)   Pulse 84   Temp 98 F (36.7 C) (Oral)   Resp 18   SpO2 97%   Physical Exam  Constitutional: He is oriented to person, place, and time. He appears well-developed and well-nourished. No distress.  HENT:  Head: Normocephalic and atraumatic.  Eyes: Conjunctivae are normal. No scleral icterus.  Neck: Normal range of motion.  Cardiovascular: Normal rate, regular rhythm, normal heart sounds and intact distal pulses.   No murmur heard. Capillary refill < 3 sec  Pulmonary/Chest: Effort normal and breath sounds normal. No respiratory distress.  Musculoskeletal: Normal range of motion. He exhibits no edema.  ROM: Full range  of motion of all major joints of the right upper extremity.  Full pronation and supination.  Neurological: He is alert and oriented to person, place, and time.  Sensation: Intact to the right upper extremity Strength: 5/5 in the right upper extremity including grip strength  Skin: Skin is warm and dry. Laceration noted. He is not diaphoretic.  3 cm laceration to the dorsum of the right forearm. Mild erythema surrounding the laceration without induration. No purulent drainage.  Psychiatric: He has a normal mood and affect.  Nursing note and vitals reviewed.    ED Treatments / Results   Procedures .Marland KitchenLaceration Repair Date/Time: 05/25/2017 12:22 AM Performed by: Abigail Butts Authorized by: Abigail Butts   Consent:    Consent obtained:  Verbal   Consent given by:  Patient   Risks discussed:  Infection, pain and poor cosmetic result   Alternatives discussed:  No treatment Laceration details:    Location:  Shoulder/arm   Shoulder/arm location:  R lower arm   Length (cm):  3 Pre-procedure details:    Preparation:  Patient was prepped and draped in usual sterile fashion Exploration:    Hemostasis achieved with:  Direct pressure   Wound exploration: entire depth of wound probed and visualized   Treatment:    Area cleansed with:  Betadine   Amount of cleaning:  Extensive   Irrigation solution:  Sterile water Skin repair:    Repair method:  Steri-Strips   Number of Steri-Strips:  5 Approximation:    Approximation:  Loose   V[place] border: well-aligned   Post-procedure details:    Dressing:  Open (no dressing)   Patient tolerance of procedure:  Tolerated well, no immediate complications    (including critical care time)  Medications Ordered in ED Medications  lidocaine (PF) (XYLOCAINE) 1 % injection 10 mL (not administered)  Tdap (BOOSTRIX) injection 0.5 mL (not administered)  sulfamethoxazole-trimethoprim (BACTRIM DS,SEPTRA DS) 800-160 MG per tablet 1 tablet  (not administered)     Initial Impression / Assessment and Plan / ED Course  I have reviewed the triage vital signs and the nursing notes.  Pertinent labs & imaging results that were available during my care of the patient were reviewed by me and considered in my medical decision making (see chart for details).     Pressure irrigation performed. Wound explored and base of wound visualized in a bloodless field without evidence of foreign body.  Laceration reportedly occurred approximately 10 hours ago however surrounding erythema indicates potential early infection and likely wound that is older than not. Steri-Strips placed.  Tdap updated.  Pt has no history of HIV, immunosuppression or diabetes. Pt discharged with antibiotics.  Discussed wound home care with patient and answered questions. Pt to follow-up for wound check in 3-5 days; they are to return to the ED sooner for signs of infection. Pt is hemodynamically stable with no complaints prior to dc.    Final Clinical Impressions(s) / ED Diagnoses   Final diagnoses:  Laceration of right  upper extremity, initial encounter    New Prescriptions New Prescriptions   SULFAMETHOXAZOLE-TRIMETHOPRIM (BACTRIM DS,SEPTRA DS) 800-160 MG TABLET    Take 1 tablet by mouth 2 (two) times daily.     Xochitl Egle, Gwenlyn Perking 05/25/17 [place], April, MD 05/25/17 [id number]

## 2017-05-25 NOTE — Discharge Instructions (Signed)
1. Medications: bactrim, ibuprofen for pain, usual home medications 2. Treatment: ice for swelling, keep wound clean with warm soap and water and keep bandage dry, do not submerge in water for 24 hours 3. Follow Up: Please return in 3 days to have your stitches/staples removed or sooner if you have concerns. Return to the emergency department for increased redness, drainage of pus from the wound   WOUND CARE  Keep area clean and dry for 24 hours. Do not remove bandage, if applied.  After 24 hours, remove bandage and wash wound gently with mild soap and warm water. Reapply a new bandage after cleaning wound, if directed.   Continue daily cleansing with soap and water until stitches/staples are removed.  Do not apply any ointments or creams to the wound while stitches/staples are in place, as this may cause delayed healing. Return if you experience any of the following signs of infection: Swelling, redness, pus drainage, streaking, fever >101.0 F  Return if you experience excessive bleeding that does not stop after 15-20 minutes of constant, firm pressure.

## 2017-05-25 NOTE — ED Provider Notes (Signed)
Medical screening examination/treatment/procedure(s) were conducted as a shared visit with non-physician practitioner(s) and myself.  I personally evaluated the patient during the encounter.  Seen and examined ncat PERRL MMM NECK IS SUPPLE RRR CTAB NABS LACERATION ON MID DORSAL RIGHT FOREARM WITH SURROUNDING ERYTHEMA  PLAN STERI STRIP AND Laurence Compton, Randalyn Ahmed, MD 05/25/17 [phone]

## 2017-05-25 NOTE — ED Notes (Signed)
Pt stable, ambulatory, states understanding of discharge instructions 

## 2017-06-05 ENCOUNTER — Ambulatory Visit: Payer: Medicare Other | Admitting: Family Medicine

## 2017-06-10 ENCOUNTER — Encounter: Payer: Self-pay | Admitting: Family Medicine

## 2017-09-16 ENCOUNTER — Ambulatory Visit (INDEPENDENT_AMBULATORY_CARE_PROVIDER_SITE_OTHER): Payer: Medicare Other | Admitting: Family Medicine

## 2017-09-16 ENCOUNTER — Other Ambulatory Visit: Payer: Self-pay

## 2017-09-16 ENCOUNTER — Encounter: Payer: Self-pay | Admitting: Family Medicine

## 2017-09-16 VITALS — BP 118/76 | HR 100 | Temp 98.4°F | Resp 18 | Ht 63.0 in | Wt 117.0 lb

## 2017-09-16 DIAGNOSIS — J410 Simple chronic bronchitis: Secondary | ICD-10-CM | POA: Diagnosis not present

## 2017-09-16 DIAGNOSIS — I6521 Occlusion and stenosis of right carotid artery: Secondary | ICD-10-CM

## 2017-09-16 DIAGNOSIS — Z23 Encounter for immunization: Secondary | ICD-10-CM

## 2017-09-16 DIAGNOSIS — Z125 Encounter for screening for malignant neoplasm of prostate: Secondary | ICD-10-CM

## 2017-09-16 DIAGNOSIS — Z0000 Encounter for general adult medical examination without abnormal findings: Secondary | ICD-10-CM

## 2017-09-16 DIAGNOSIS — Z1211 Encounter for screening for malignant neoplasm of colon: Secondary | ICD-10-CM

## 2017-09-16 DIAGNOSIS — Z1159 Encounter for screening for other viral diseases: Secondary | ICD-10-CM

## 2017-09-16 MED ORDER — UMECLIDINIUM BROMIDE 62.5 MCG/INH IN AEPB: 1.0000 | INHALATION_SPRAY | Freq: Every day | RESPIRATORY_TRACT | 11 refills | Status: DC

## 2017-09-16 MED ORDER — ESOMEPRAZOLE MAGNESIUM 40 MG PO CPDR: 40.0000 mg | DELAYED_RELEASE_CAPSULE | Freq: Every day | ORAL | 3 refills | Status: DC

## 2017-09-16 MED ORDER — ALBUTEROL SULFATE HFA 108 (90 BASE) MCG/ACT IN AERS: 2.0000 | INHALATION_SPRAY | RESPIRATORY_TRACT | 3 refills | Status: DC | PRN

## 2017-09-16 MED ORDER — SIMVASTATIN 20 MG PO TABS: 20.0000 mg | ORAL_TABLET | Freq: Every day | ORAL | 3 refills | Status: DC

## 2017-09-16 NOTE — Progress Notes (Signed)
Subjective:   Patient presents for Medicare Annual/Subsequent preventive examination.   Pt here for annual wellness exam Medications reviewed    Chronic bronchitis- He was taking Incruse and albuterol, ran out of incruse   Hyperlipidemia/Carotid artery disease on zocor    GERD- on nexium , symptoms controlled  Review Past Medical/Family/Social: Per EMR    Risk Factors  Current exercise habits: Yes Dietary issues discussed: Yses  Cardiac risk factors: Carotid artery disesae, hyperlipidemia   Depression Screen  (Note: if answer to either of the following is "Yes", a more complete depression screening is indicated)  Over the past two weeks, have you felt down, depressed or hopeless? No Over the past two weeks, have you felt little interest or pleasure in doing things? No Have you lost interest or pleasure in daily life? No Do you often feel hopeless? No Do you cry easily over simple problems? No   Activities of Daily Living  In your present state of health, do you have any difficulty performing the following activities?:  Driving? No  Managing money? No  Feeding yourself? No  Getting from bed to chair? No  Climbing a flight of stairs? No  Preparing food and eating?: No  Bathing or showering? No  Getting dressed: No  Getting to the toilet? No  Using the toilet:No  Moving around from place to place: No  In the past year have you fallen or had a near fall?:No  Are you sexually active? yes Do you have more than one partner? No   Hearing Difficulties: No  Do you often ask people to speak up or repeat themselves? No  Do you experience ringing or noises in your ears? No Do you have difficulty understanding soft or whispered voices? No  Do you feel that you have a problem with memory? No Do you often misplace items? No  Do you feel safe at home? Yes  Cognitive Testing  Alert? Yes Normal Appearance?Yes  Oriented to person? Yes Place? Yes  Time? Yes  Recall of three objects?  Yes  Can perform simple calculations? Yes  Displays appropriate judgment?Yes  Can read the correct time from a watch face?Yes   List the Names of Other Physician/Practitioners you currently use:  None currently   Screening Tests / Date Colonoscopy   DUE                  Zostavax  PNA- UTD Influenza Vaccine - Due Tetanus/tdapUTD  ROS:  GEN- denies fatigue, fever, weight loss,weakness, recent illness HEENT- denies eye drainage, change in vision, nasal discharge, CVS- denies chest pain, palpitations RESP- denies SOB, cough, wheeze ABD- denies N/V, change in stools, abd pain GU- denies dysuria, hematuria, dribbling, incontinence MSK- denies joint pain, muscle aches, injury Neuro- denies headache, dizziness, syncope, seizure activity  PHYSICAL: GEN- NAD, alert and oriented x3 HEENT- PERRL, EOMI, non injected sclera, pink conjunctiva, MMM, oropharynx clear Neck- Supple, no thryomegaly, + bruit  CVS- RRR, no murmur RESP-CTAB ABD-NABS,soft,NT,ND  EXT- No edema Pulses- Radial, DP- 2+    Assessment:    Annual wellness medicare exam   Plan:    During the course of the visit the patient was educated and counseled about appropriate screening and preventive services including:  COPD- restart the incruse. He is non smoker.   Colon cancer screening- agrees to cologuard testing   PSA- screening to be done   Shingles vaccine. Declines  ScreenNEG  for depression. PHQ- 9 score of 0.  Carotid AD- recheck  ultrasound  Weight loss- discussed eating more protein, down a few pounds also supplement with ensure/boost, check labs today    Advanced directives given  Diet review for nutrition referral? Yes ____ Not Indicated __x__  Patient Instructions (the written plan) was given to the patient.  Medicare Attestation  I have personally reviewed:  The patient's medical and social history  Their use of alcohol, tobacco or illicit drugs  Their current medications and supplements  The  patient's functional ability including ADLs,fall risks, home safety risks, cognitive, and hearing and visual impairment  Diet and physical activities  Evidence for depression or mood disorders  The patient's weight, height, BMI, and visual acuity have been recorded in the chart. I have made referrals, counseling, and provided education to the patient based on review of the above and I have provided the patient with a written personalized care plan for preventive services.

## 2017-09-16 NOTE — Patient Instructions (Addendum)
Carotid ultrasound to be done  Inhalers sent to pharmacy  We will call lab results  Dirnk ensure, more protein, try to eat three times a day  Cologuard to be done  F/U 6 months

## 2017-09-17 ENCOUNTER — Encounter: Payer: Self-pay | Admitting: Family Medicine

## 2017-09-17 LAB — LIPID PANEL
Cholesterol: 182 mg/dL (ref <200)
HDL: 115 mg/dL (ref >40)
LDL CHOLESTEROL (CALC): 53 mg/dL
NON-HDL CHOLESTEROL (CALC): 67 mg/dL (ref <130)
TRIGLYCERIDES: 55 mg/dL (ref <150)
Total CHOL/HDL Ratio: 1.6 (calc) (ref <5.0)

## 2017-09-17 LAB — CBC WITH DIFFERENTIAL/PLATELET
BASOS PCT: 0.8 %
Basophils Absolute: 30 cells/uL (ref 0–200)
EOS PCT: 1.6 %
Eosinophils Absolute: 61 cells/uL (ref 15–500)
HEMATOCRIT: 41.8 % (ref 38.5–50.0)
HEMOGLOBIN: 14.9 g/dL (ref 13.2–17.1)
Lymphs Abs: 1349 cells/uL (ref 850–3900)
MCH: 36.2 pg — ABNORMAL HIGH (ref 27.0–33.0)
MCHC: 35.6 g/dL (ref 32.0–36.0)
MCV: 101.5 fL — ABNORMAL HIGH (ref 80.0–100.0)
MPV: 9.9 fL (ref 7.5–12.5)
Monocytes Relative: 11.1 %
NEUTROS ABS: 1938 {cells}/uL (ref 1500–7800)
Neutrophils Relative %: 51 %
Platelets: 150 10*3/uL (ref 140–400)
RBC: 4.12 10*6/uL — AB (ref 4.20–5.80)
RDW: 13 % (ref 11.0–15.0)
Total Lymphocyte: 35.5 %
WBC: 3.8 10*3/uL (ref 3.8–10.8)
WBCMIX: 422 {cells}/uL (ref 200–950)

## 2017-09-17 LAB — COMPREHENSIVE METABOLIC PANEL
AG RATIO: 1.3 (calc) (ref 1.0–2.5)
ALT: 25 U/L (ref 9–46)
AST: 45 U/L — ABNORMAL HIGH (ref 10–35)
Albumin: 4.1 g/dL (ref 3.6–5.1)
Alkaline phosphatase (APISO): 63 U/L (ref 40–115)
BUN / CREAT RATIO: 5 (calc) — AB (ref 6–22)
BUN: 4 mg/dL — ABNORMAL LOW (ref 7–25)
CO2: 24 mmol/L (ref 20–32)
Calcium: 9.7 mg/dL (ref 8.6–10.3)
Chloride: 93 mmol/L — ABNORMAL LOW (ref 98–110)
Creat: 0.84 mg/dL (ref 0.70–1.25)
Globulin: 3.2 g/dL (calc) (ref 1.9–3.7)
Glucose, Bld: 90 mg/dL (ref 65–99)
Potassium: 4.7 mmol/L (ref 3.5–5.3)
SODIUM: 130 mmol/L — AB (ref 135–146)
TOTAL PROTEIN: 7.3 g/dL (ref 6.1–8.1)
Total Bilirubin: 0.7 mg/dL (ref 0.2–1.2)

## 2017-09-17 LAB — PSA: PSA: 0.7 ng/mL (ref <=4.0)

## 2017-09-17 LAB — HEPATITIS C ANTIBODY
HEP C AB: NONREACTIVE
SIGNAL TO CUT-OFF: 0.14 (ref <1.00)

## 2017-09-25 ENCOUNTER — Ambulatory Visit
Admission: RE | Admit: 2017-09-25 | Discharge: 2017-09-25 | Disposition: A | Discharge status: Home / Self Care | Payer: Medicare Other | Source: Ambulatory Visit | Attending: Family Medicine | Admitting: Family Medicine

## 2017-09-25 DIAGNOSIS — I6521 Occlusion and stenosis of right carotid artery: Secondary | ICD-10-CM

## 2017-09-25 DIAGNOSIS — I6523 Occlusion and stenosis of bilateral carotid arteries: Secondary | ICD-10-CM | POA: Diagnosis not present

## 2017-09-25 DIAGNOSIS — Z1211 Encounter for screening for malignant neoplasm of colon: Secondary | ICD-10-CM | POA: Diagnosis not present

## 2017-09-25 DIAGNOSIS — Z1212 Encounter for screening for malignant neoplasm of rectum: Secondary | ICD-10-CM | POA: Diagnosis not present

## 2017-09-25 LAB — COLOGUARD: Cologuard: NEGATIVE

## 2017-10-07 ENCOUNTER — Other Ambulatory Visit: Payer: Self-pay | Admitting: *Deleted

## 2017-10-07 MED ORDER — ALBUTEROL SULFATE HFA 108 (90 BASE) MCG/ACT IN AERS: 2.0000 | INHALATION_SPRAY | RESPIRATORY_TRACT | 3 refills | Status: DC | PRN

## 2017-10-18 ENCOUNTER — Encounter: Payer: Self-pay | Admitting: *Deleted

## 2018-02-06 DIAGNOSIS — R531 Weakness: Secondary | ICD-10-CM | POA: Diagnosis not present

## 2018-02-06 DIAGNOSIS — E871 Hypo-osmolality and hyponatremia: Secondary | ICD-10-CM | POA: Diagnosis not present

## 2018-02-06 DIAGNOSIS — R079 Chest pain, unspecified: Secondary | ICD-10-CM | POA: Diagnosis not present

## 2018-02-06 DIAGNOSIS — R42 Dizziness and giddiness: Secondary | ICD-10-CM | POA: Diagnosis not present

## 2018-02-06 DIAGNOSIS — J40 Bronchitis, not specified as acute or chronic: Secondary | ICD-10-CM | POA: Diagnosis not present

## 2018-02-06 DIAGNOSIS — R0981 Nasal congestion: Secondary | ICD-10-CM | POA: Diagnosis not present

## 2018-02-06 DIAGNOSIS — R918 Other nonspecific abnormal finding of lung field: Secondary | ICD-10-CM | POA: Diagnosis not present

## 2018-02-06 DIAGNOSIS — R05 Cough: Secondary | ICD-10-CM | POA: Diagnosis not present

## 2018-02-07 ENCOUNTER — Ambulatory Visit: Payer: Medicare Other | Admitting: Family Medicine

## 2018-03-17 ENCOUNTER — Ambulatory Visit: Payer: Medicare Other | Admitting: Family Medicine

## 2018-03-25 ENCOUNTER — Encounter: Payer: Self-pay | Admitting: Family Medicine

## 2018-03-25 ENCOUNTER — Other Ambulatory Visit: Payer: Self-pay

## 2018-03-25 ENCOUNTER — Ambulatory Visit (INDEPENDENT_AMBULATORY_CARE_PROVIDER_SITE_OTHER): Payer: Medicare Other | Admitting: Family Medicine

## 2018-03-25 VITALS — BP 128/64 | HR 62 | Temp 98.1°F | Resp 16 | Ht 63.0 in | Wt 114.0 lb

## 2018-03-25 DIAGNOSIS — E441 Mild protein-calorie malnutrition: Secondary | ICD-10-CM

## 2018-03-25 DIAGNOSIS — E46 Unspecified protein-calorie malnutrition: Secondary | ICD-10-CM | POA: Insufficient documentation

## 2018-03-25 DIAGNOSIS — J410 Simple chronic bronchitis: Secondary | ICD-10-CM

## 2018-03-25 DIAGNOSIS — I6521 Occlusion and stenosis of right carotid artery: Secondary | ICD-10-CM

## 2018-03-25 MED ORDER — UMECLIDINIUM BROMIDE 62.5 MCG/INH IN AEPB: 1.0000 | INHALATION_SPRAY | Freq: Every day | RESPIRATORY_TRACT | 11 refills | Status: DC

## 2018-03-25 MED ORDER — ESOMEPRAZOLE MAGNESIUM 40 MG PO CPDR: 40.0000 mg | DELAYED_RELEASE_CAPSULE | Freq: Every day | ORAL | 3 refills | Status: DC

## 2018-03-25 MED ORDER — ALBUTEROL SULFATE HFA 108 (90 BASE) MCG/ACT IN AERS: 2.0000 | INHALATION_SPRAY | RESPIRATORY_TRACT | 3 refills | Status: DC | PRN

## 2018-03-25 NOTE — Assessment & Plan Note (Signed)
He does not recall which hospital/ER he went to , I dont see anything in [facility] He appears to be back at baseline Continue inhalers

## 2018-03-25 NOTE — Progress Notes (Signed)
   Subjective:    Patient ID: Lee Wilson, male    DOB: [birth year], 72 y.o.   MRN: [id number]  Patient presents for Follow-up (is fasting)  Pt here to f/u chronic medical problems  Meds reviewed   Carotid artery disease- s/p [place] in Dec, Stable plaque buildup, on statin drug   COPD- taking incruse and albuterol prn    States he had a virus 3 weeks ago, states he ended up at a hopsital in [place]- I don't see/ he states he was given fluids, but no prescriptions   states breathing back to normal   Weight loss- weight down another 3lbs since visit in Dec, states he eats when he wants, often skips breakfast. Weight has ranged 113-122 since 2017   GERD- nexium daily as needed      Review Of Systems:  GEN- denies fatigue, fever, weight loss,weakness, recent illness HEENT- denies eye drainage, change in vision, nasal discharge, CVS- denies chest pain, palpitations RESP- denies SOB, cough, wheeze ABD- denies N/V, change in stools, abd pain GU- denies dysuria, hematuria, dribbling, incontinence MSK- denies joint pain, muscle aches, injury Neuro- denies headache, dizziness, syncope, seizure activity       Objective:    BP 128/64   Pulse 62   Temp 98.1 F (36.7 C) (Oral)   Resp 16   Ht 5\' 3"  (1.6 m)   Wt 114 lb (51.7 kg)   SpO2 98%   BMI 20.19 kg/m  GEN- NAD, alert and oriented x3, thin elderly male  HEENT- PERRL, EOMI, non injected sclera, pink conjunctiva, MMM, oropharynx clear, missing most of teeth Neck- Supple, no thyromegaly CVS- RRR, no murmur RESP-CTAB ABD-NABS,soft,NT,ND Skin- some muscle wasting in hands EXT- No edema Pulses- Radial  2+        Assessment & Plan:      Problem List Items Addressed This Visit      Unprioritized   Carotid artery disease ([place]) - Primary    Recent US, repeat in 2 years Continue statin Check fasting labs today       Relevant Orders   CBC with Differential/Platelet   Comprehensive metabolic panel   Lipid panel   Chronic bronchitis ([place])    He does not recall which hospital/ER he went to , I dont see anything in [facility] He appears to be back at baseline Continue inhalers       Protein-calorie malnutrition ([place])    Discussed diet, recommend he add ensure daily Eat at least 3 times a day  Add MVI He drinks 3 beers a day, recommend he decrease to 2 a day and try to decrease so he is not drinking daily       Relevant Orders   CBC with Differential/Platelet   TSH      Note: This dictation was prepared with Dragon dictation along with smaller phrase technology. Any transcriptional errors that result from this process are unintentional.

## 2018-03-25 NOTE — Assessment & Plan Note (Signed)
Recent US, repeat in 2 years Continue statin Check fasting labs today

## 2018-03-25 NOTE — Assessment & Plan Note (Addendum)
Discussed diet, recommend he add ensure daily Eat at least 3 times a day  Add MVI He drinks 3 beers a day, recommend he decrease to 2 a day and try to decrease so he is not drinking daily

## 2018-03-25 NOTE — Patient Instructions (Addendum)
Drink ensure  Take vitamin once a day  Continue all other medication  F/U 6 months for Physical

## 2018-03-27 LAB — COMPREHENSIVE METABOLIC PANEL
AG Ratio: 1.3 (calc) (ref 1.0–2.5)
ALBUMIN MSPROF: 3.5 g/dL — AB (ref 3.6–5.1)
ALT: 12 U/L (ref 9–46)
AST: 24 U/L (ref 10–35)
Alkaline phosphatase (APISO): 58 U/L (ref 40–115)
BUN: 8 mg/dL (ref 7–25)
CO2: 25 mmol/L (ref 20–32)
CREATININE: 0.82 mg/dL (ref 0.70–1.25)
Calcium: 8.7 mg/dL (ref 8.6–10.3)
Chloride: 97 mmol/L — ABNORMAL LOW (ref 98–110)
GLUCOSE: 93 mg/dL (ref 65–99)
Globulin: 2.7 g/dL (calc) (ref 1.9–3.7)
POTASSIUM: 4.3 mmol/L (ref 3.5–5.3)
SODIUM: 132 mmol/L — AB (ref 135–146)
TOTAL PROTEIN: 6.2 g/dL (ref 6.1–8.1)
Total Bilirubin: 0.5 mg/dL (ref 0.2–1.2)

## 2018-03-27 LAB — CBC WITH DIFFERENTIAL/PLATELET
BASOS ABS: 48 {cells}/uL (ref 0–200)
Basophils Relative: 1 %
EOS PCT: 1.7 %
Eosinophils Absolute: 82 cells/uL (ref 15–500)
HCT: 36.6 % — ABNORMAL LOW (ref 38.5–50.0)
HEMOGLOBIN: 12.9 g/dL — AB (ref 13.2–17.1)
LYMPHS ABS: 1934 {cells}/uL (ref 850–3900)
MCH: 35.3 pg — ABNORMAL HIGH (ref 27.0–33.0)
MCHC: 35.2 g/dL (ref 32.0–36.0)
MCV: 100.3 fL — ABNORMAL HIGH (ref 80.0–100.0)
MONOS PCT: 18.7 %
MPV: 9.5 fL (ref 7.5–12.5)
NEUTROS ABS: 1838 {cells}/uL (ref 1500–7800)
NEUTROS PCT: 38.3 %
Platelets: 138 10*3/uL — ABNORMAL LOW (ref 140–400)
RBC: 3.65 10*6/uL — AB (ref 4.20–5.80)
RDW: 13.1 % (ref 11.0–15.0)
Total Lymphocyte: 40.3 %
WBC mixed population: 898 cells/uL (ref 200–950)
WBC: 4.8 10*3/uL (ref 3.8–10.8)

## 2018-03-27 LAB — LIPID PANEL
CHOL/HDL RATIO: 2.1 (calc) (ref <5.0)
Cholesterol: 156 mg/dL (ref <200)
HDL: 76 mg/dL (ref >40)
LDL CHOLESTEROL (CALC): 66 mg/dL
NON-HDL CHOLESTEROL (CALC): 80 mg/dL (ref <130)
TRIGLYCERIDES: 49 mg/dL (ref <150)

## 2018-03-27 LAB — TEST AUTHORIZATION

## 2018-03-27 LAB — TSH: TSH: 15.55 m[IU]/L — AB (ref 0.40–4.50)

## 2018-03-27 LAB — T3, FREE: T3 FREE: 2.5 pg/mL (ref 2.3–4.2)

## 2018-03-27 LAB — T4, FREE: Free T4: 0.7 ng/dL — ABNORMAL LOW (ref 0.8–1.8)

## 2018-03-31 ENCOUNTER — Other Ambulatory Visit: Payer: Self-pay | Admitting: *Deleted

## 2018-03-31 MED ORDER — LEVOTHYROXINE SODIUM 25 MCG PO TABS: 25.0000 ug | ORAL_TABLET | Freq: Every day | ORAL | 1 refills | Status: DC

## 2018-04-04 ENCOUNTER — Encounter: Payer: Self-pay | Admitting: Family Medicine

## 2018-04-28 ENCOUNTER — Other Ambulatory Visit: Payer: Self-pay | Admitting: Family Medicine

## 2018-05-12 ENCOUNTER — Other Ambulatory Visit: Payer: Self-pay | Admitting: Family Medicine

## 2018-05-12 ENCOUNTER — Other Ambulatory Visit: Payer: Medicare Other

## 2018-05-12 DIAGNOSIS — E039 Hypothyroidism, unspecified: Secondary | ICD-10-CM | POA: Diagnosis not present

## 2018-05-12 LAB — T4, FREE: Free T4: 0.8 ng/dL (ref 0.8–1.8)

## 2018-05-12 LAB — T3, FREE: T3, Free: 2.4 pg/mL (ref 2.3–4.2)

## 2018-05-12 LAB — TSH: TSH: 5.03 mIU/L — ABNORMAL HIGH (ref 0.40–4.50)

## 2018-09-24 ENCOUNTER — Encounter: Payer: Medicare Other | Admitting: Family Medicine

## 2018-10-24 ENCOUNTER — Other Ambulatory Visit: Payer: Self-pay | Admitting: Family Medicine

## 2018-12-01 ENCOUNTER — Ambulatory Visit (INDEPENDENT_AMBULATORY_CARE_PROVIDER_SITE_OTHER): Payer: Medicare Other | Admitting: Family Medicine

## 2018-12-01 ENCOUNTER — Encounter: Payer: Self-pay | Admitting: Family Medicine

## 2018-12-01 ENCOUNTER — Other Ambulatory Visit: Payer: Self-pay

## 2018-12-01 VITALS — BP 122/68 | HR 76 | Temp 98.3°F | Resp 14 | Ht 63.0 in | Wt 124.0 lb

## 2018-12-01 DIAGNOSIS — J410 Simple chronic bronchitis: Secondary | ICD-10-CM

## 2018-12-01 DIAGNOSIS — Z23 Encounter for immunization: Secondary | ICD-10-CM

## 2018-12-01 DIAGNOSIS — I6521 Occlusion and stenosis of right carotid artery: Secondary | ICD-10-CM | POA: Diagnosis not present

## 2018-12-01 DIAGNOSIS — Z7289 Other problems related to lifestyle: Secondary | ICD-10-CM

## 2018-12-01 DIAGNOSIS — Z125 Encounter for screening for malignant neoplasm of prostate: Secondary | ICD-10-CM | POA: Diagnosis not present

## 2018-12-01 DIAGNOSIS — J309 Allergic rhinitis, unspecified: Secondary | ICD-10-CM

## 2018-12-01 DIAGNOSIS — Z0000 Encounter for general adult medical examination without abnormal findings: Secondary | ICD-10-CM

## 2018-12-01 DIAGNOSIS — E039 Hypothyroidism, unspecified: Secondary | ICD-10-CM | POA: Insufficient documentation

## 2018-12-01 DIAGNOSIS — Z789 Other specified health status: Secondary | ICD-10-CM | POA: Insufficient documentation

## 2018-12-01 MED ORDER — ESOMEPRAZOLE MAGNESIUM 40 MG PO CPDR: 40.0000 mg | DELAYED_RELEASE_CAPSULE | Freq: Every day | ORAL | 3 refills | Status: DC

## 2018-12-01 MED ORDER — LEVOTHYROXINE SODIUM 25 MCG PO TABS: ORAL_TABLET | ORAL | 1 refills | Status: DC

## 2018-12-01 MED ORDER — CETIRIZINE HCL 10 MG PO TABS: 10.0000 mg | ORAL_TABLET | Freq: Every day | ORAL | 11 refills | Status: DC

## 2018-12-01 MED ORDER — SIMVASTATIN 20 MG PO TABS: 20.0000 mg | ORAL_TABLET | Freq: Every day | ORAL | 3 refills | Status: DC

## 2018-12-01 MED ORDER — ALBUTEROL SULFATE HFA 108 (90 BASE) MCG/ACT IN AERS: 2.0000 | INHALATION_SPRAY | RESPIRATORY_TRACT | 3 refills | Status: DC | PRN

## 2018-12-01 MED ORDER — UMECLIDINIUM BROMIDE 62.5 MCG/INH IN AEPB: 1.0000 | INHALATION_SPRAY | Freq: Every day | RESPIRATORY_TRACT | 11 refills | Status: DC

## 2018-12-01 NOTE — Progress Notes (Signed)
Subjective:   Patient presents for Medicare Annual/Subsequent preventive examination.    Pt here for annual wellness exam   No concerns    Chronic bronchitis- He was taking Incruse and albuterol, breathing is good, he is a non smoker   Hyperlipidemia/Carotid artery disease on zocor    GERD- on nexium , symptoms controlled   Caraotid artery disease- on statin drug, due for repeat in 2021  Weight improved, up 10lbs since our visit in June, discussed eating three times a day, protein supplements , now drinking 6 beers    Review Past Medical/Family/Social: Reviewed    Risk Factors  Current exercise habits: walks and is active outside  Dietary issues discussed: improved weight   Cardiac risk factors: carotid artery disease, Hyperlipidemia   Depression Screen  (Note: if answer to either of the following is "Yes", a more complete depression screening is indicated)  Over the past two weeks, have you felt down, depressed or hopeless? No Over the past two weeks, have you felt little interest or pleasure in doing things? No Have you lost interest or pleasure in daily life? No Do you often feel hopeless? No Do you cry easily over simple problems? No   Activities of Daily Living  In your present state of health, do you have any difficulty performing the following activities?:  Driving? No  Managing money? No  Feeding yourself? No  Getting from bed to chair? No  Climbing a flight of stairs? No  Preparing food and eating?: No  Bathing or showering? No  Getting dressed: No  Getting to the toilet? No  Using the toilet:No  Moving around from place to place: No  In the past year have you fallen or had a near fall?:No  Are you sexually active? No  Do you have more than one partner? No   Hearing Difficulties: No  Do you often ask people to speak up or repeat themselves? No  Do you experience ringing or noises in your ears? No Do you have difficulty understanding soft or whispered  voices? No  Do you feel that you have a problem with memory? No Do you often misplace items? No  Do you feel safe at home? Yes  Cognitive Testing  Alert? Yes Normal Appearance?Yes  Oriented to person? Yes Place? Yes  Time? Yes  Recall of three objects? Yes  Can perform simple calculations? Yes  Displays appropriate judgment?Yes  Can read the correct time from a watch face?Yes    Screening Tests / Date Cologuard UTD                   Zostavax  Declines  Pneumonia- UTD Influenza Vaccine  Due  Tetanus/tdap UTD  ROS: GEN- denies fatigue, fever, weight loss,weakness, recent illness HEENT- denies eye drainage, change in vision, nasal discharge, CVS- denies chest pain, palpitations RESP- denies SOB, cough, wheeze ABD- denies N/V, change in stools, abd pain GU- denies dysuria, hematuria, dribbling, incontinence MSK- denies joint pain, muscle aches, injury Neuro- denies headache, dizziness, syncope, seizure activity  Physical- vitals reviewed, weight up 10lbs  GEN- NAD, alert and oriented x3 HEENT- PERRL, EOMI, non injected sclera, pink conjunctiva, MMM, oropharynx clear, TM clear no effusion, nares clear  Neck- Supple, no thryomegaly,carotid bruit  CVS- RRR, no murmur RESP-CTAB ABD-NABS,soft,nt,nd EXT- No edema Pulses- Radial, DP- 2+     Assessment:    Annual wellness medicare exam   Plan:    During the course of the visit the patient was educated  and counseled about appropriate screening and preventive services including:     Discussed cutting back alcohol again, now drinking 6 beers a day, recommend cut down to 3 a day, but his appetite has improved and weigh is up    Chronic bronchitis- continue incruse, Flu shot given  Hypothyroidism- recheck TSH, continue synthroid   GERD- on nexium, continues to benefit    No falls, depression screen neg  Discussed advanced directives will discuss paperwork with daughter   Carotid artery disesae/hyperlipidemia- continue  statin, recheck [place] in 2021           Diet review for nutrition referral? Yes ____ Not Indicated __x__  Patient Instructions (the written plan) was given to the patient.  Medicare Attestation  I have personally reviewed:  The patient's medical and social history  Their use of alcohol, tobacco or illicit drugs  Their current medications and supplements  The patient's functional ability including ADLs,fall risks, home safety risks, cognitive, and hearing and visual impairment  Diet and physical activities  Evidence for depression or mood disorders  The patient's weight, height, BMI, and visual acuity have been recorded in the chart. I have made referrals, counseling, and provided education to the patient based on review of the above and I have provided the patient with a written personalized care plan for preventive services.

## 2018-12-01 NOTE — Patient Instructions (Signed)
F/U 6 months Review the advanced directives with your daughter FLu shot given

## 2018-12-02 ENCOUNTER — Other Ambulatory Visit: Payer: Self-pay | Admitting: *Deleted

## 2018-12-02 LAB — LIPID PANEL
Cholesterol: 201 mg/dL — ABNORMAL HIGH (ref <200)
HDL: 101 mg/dL (ref >=40)
LDL CHOLESTEROL (CALC): 83 mg/dL
NON-HDL CHOLESTEROL (CALC): 100 mg/dL (ref <130)
Total CHOL/HDL Ratio: 2 (calc) (ref <5.0)
Triglycerides: 77 mg/dL (ref <150)

## 2018-12-02 LAB — COMPREHENSIVE METABOLIC PANEL
AG RATIO: 1.4 (calc) (ref 1.0–2.5)
ALBUMIN MSPROF: 4.1 g/dL (ref 3.6–5.1)
ALT: 14 U/L (ref 9–46)
AST: 32 U/L (ref 10–35)
Alkaline phosphatase (APISO): 57 U/L (ref 35–144)
BILIRUBIN TOTAL: 0.5 mg/dL (ref 0.2–1.2)
BUN: 7 mg/dL (ref 7–25)
CALCIUM: 9.4 mg/dL (ref 8.6–10.3)
CO2: 26 mmol/L (ref 20–32)
Chloride: 95 mmol/L — ABNORMAL LOW (ref 98–110)
Creat: 0.84 mg/dL (ref 0.70–1.18)
Globulin: 3 g/dL (calc) (ref 1.9–3.7)
Glucose, Bld: 89 mg/dL (ref 65–99)
POTASSIUM: 4.5 mmol/L (ref 3.5–5.3)
SODIUM: 130 mmol/L — AB (ref 135–146)
TOTAL PROTEIN: 7.1 g/dL (ref 6.1–8.1)

## 2018-12-02 LAB — CBC WITH DIFFERENTIAL/PLATELET
ABSOLUTE MONOCYTES: 715 {cells}/uL (ref 200–950)
BASOS ABS: 29 {cells}/uL (ref 0–200)
Basophils Relative: 0.6 %
Eosinophils Absolute: 72 cells/uL (ref 15–500)
Eosinophils Relative: 1.5 %
HEMATOCRIT: 37.1 % — AB (ref 38.5–50.0)
HEMOGLOBIN: 13.1 g/dL — AB (ref 13.2–17.1)
LYMPHS ABS: 1848 {cells}/uL (ref 850–3900)
MCH: 35.8 pg — ABNORMAL HIGH (ref 27.0–33.0)
MCHC: 35.3 g/dL (ref 32.0–36.0)
MCV: 101.4 fL — ABNORMAL HIGH (ref 80.0–100.0)
MPV: 9.8 fL (ref 7.5–12.5)
Monocytes Relative: 14.9 %
NEUTROS PCT: 44.5 %
Neutro Abs: 2136 cells/uL (ref 1500–7800)
Platelets: 160 10*3/uL (ref 140–400)
RBC: 3.66 10*6/uL — ABNORMAL LOW (ref 4.20–5.80)
RDW: 13.1 % (ref 11.0–15.0)
TOTAL LYMPHOCYTE: 38.5 %
WBC: 4.8 10*3/uL (ref 3.8–10.8)

## 2018-12-02 LAB — PSA: PSA: 1.7 ng/mL (ref <=4.0)

## 2018-12-02 LAB — TSH: TSH: 8.42 mIU/L — ABNORMAL HIGH (ref 0.40–4.50)

## 2018-12-02 LAB — T3, FREE: T3, Free: 2.4 pg/mL (ref 2.3–4.2)

## 2018-12-02 LAB — T4, FREE: Free T4: 1 ng/dL (ref 0.8–1.8)

## 2018-12-02 MED ORDER — LEVOTHYROXINE SODIUM 50 MCG PO TABS: 50.0000 ug | ORAL_TABLET | Freq: Every day | ORAL | 3 refills | Status: DC

## 2018-12-25 ENCOUNTER — Telehealth: Payer: Self-pay | Admitting: *Deleted

## 2018-12-25 MED ORDER — PANTOPRAZOLE SODIUM 40 MG PO TBEC: 40.0000 mg | DELAYED_RELEASE_TABLET | Freq: Every day | ORAL | 3 refills | Status: DC

## 2018-12-25 NOTE — Telephone Encounter (Signed)
Received information from insurance company regarding [employer] will not cover this medication.   Dr. Buelah Manis recommendations are as follows:  Finish Nexium, then stop.  Begin Pantoprazole 40mg  PO QD.   Prescription sent to pharmacy.   Letter sent.

## 2019-01-07 ENCOUNTER — Other Ambulatory Visit: Payer: Self-pay | Admitting: Family Medicine

## 2019-01-15 ENCOUNTER — Other Ambulatory Visit: Payer: Self-pay

## 2019-01-15 ENCOUNTER — Encounter: Payer: Self-pay | Admitting: Family Medicine

## 2019-01-15 ENCOUNTER — Ambulatory Visit (INDEPENDENT_AMBULATORY_CARE_PROVIDER_SITE_OTHER): Payer: Medicare Other | Admitting: Family Medicine

## 2019-01-15 VITALS — BP 142/78 | HR 72 | Temp 97.9°F | Resp 18 | Ht 64.0 in | Wt 128.8 lb

## 2019-01-15 DIAGNOSIS — W57XXXA Bitten or stung by nonvenomous insect and other nonvenomous arthropods, initial encounter: Secondary | ICD-10-CM | POA: Diagnosis not present

## 2019-01-15 DIAGNOSIS — R21 Rash and other nonspecific skin eruption: Secondary | ICD-10-CM | POA: Diagnosis not present

## 2019-01-15 DIAGNOSIS — S30861A Insect bite (nonvenomous) of abdominal wall, initial encounter: Secondary | ICD-10-CM | POA: Diagnosis not present

## 2019-01-15 MED ORDER — TRIAMCINOLONE ACETONIDE 0.025 % EX OINT: 1.0000 "application " | TOPICAL_OINTMENT | Freq: Two times a day (BID) | CUTANEOUS | 0 refills | Status: AC | PRN

## 2019-01-15 NOTE — Progress Notes (Signed)
Patient ID: Lee Wilson, male    DOB: 1946-03-11, 73 y.o.   MRN: [id number]  PCP: Alycia Rossetti, MD  Chief Complaint  Patient presents with  . Insect Bite    tick bite abdomen x3 weeks, red and itchy, no meds    Subjective:   [name] is a 73 y.o. male, presents to clinic with CC of itchy red spot to upper pubic/lower pelvis area.  Onset three weeks ago after he "scratched off" a tick.  The red spot continues to be itchy x 3 weeks.  It has stayed about the same size around 1 cm wide, has not spread, is not tender.  He has not tried any medicines or creams for itching. No other new rash.  No drainage.  No bull eye pattern.   Its has scabbed a little with thickened patch on top.  He just wanted "to get it checked." he gets ticks all the time. He denies fever, chills, sweats, sore throat, neck pain/stiffness, HA, joint pain/swelling, HA's.     Patient Active Problem List   Diagnosis Date Noted  . Hypothyroidism 12/01/2018  . Alcohol use 12/01/2018  . Protein-calorie malnutrition ([place]) 03/25/2018  . Carotid artery disease ([place]) 07/25/2016  . Cerebrovascular disease or lesion 07/25/2016  . Lytic bone lesions on xray 07/25/2016  . C6 cervical fracture ([place]) 07/16/2016  . C7 cervical fracture ([place]) 07/16/2016  . Fall from height of greater than 3 feet 07/16/2016  . Spleen hematoma 07/16/2016  . T1 vertebral fracture ([place]) 07/16/2016  . Chronic bronchitis ([place]) 09/22/2013  . GERD (gastroesophageal reflux disease) 09/22/2013     Prior to Admission medications   Medication Sig Start Date End Date Taking? Authorizing Provider  albuterol (PROVENTIL HFA;VENTOLIN HFA) 108 (90 Base) MCG/ACT inhaler Inhale 2 puffs into the lungs every 4 (four) hours as needed for wheezing or shortness of breath. 12/01/18   Alycia Rossetti, MD  cetirizine (ZYRTEC) 10 MG tablet Take 1 tablet (10 mg total) by mouth daily. 12/01/18   Alycia Rossetti, MD  esomeprazole (NEXIUM) 40 MG capsule  TAKE 1 CAPSULE(40 MG) BY MOUTH DAILY 01/07/19   Alycia Rossetti, MD  hydrocortisone 2.5 % cream Apply topically 2 (two) times daily. Apply to face 12/11/16   Alycia Rossetti, MD  levothyroxine (SYNTHROID, LEVOTHROID) 50 MCG tablet Take 1 tablet (50 mcg total) by mouth daily before breakfast. 12/02/18   Alycia Rossetti, MD  pantoprazole (PROTONIX) 40 MG tablet Take 1 tablet (40 mg total) by mouth daily. 12/25/18   Alycia Rossetti, MD  simvastatin (ZOCOR) 20 MG tablet Take 1 tablet (20 mg total) by mouth at bedtime. 12/01/18   Crawford, Modena Nunnery, MD  umeclidinium bromide (INCRUSE ELLIPTA) 62.5 MCG/INH AEPB Inhale 1 puff into the lungs daily. 12/01/18   Alycia Rossetti, MD     Allergies  Allergen Reactions  . Penicillin G Other (See Comments)     No family history on file.   Social History   Socioeconomic History  . Marital status: Widowed    Spouse name: Not on file  . Number of children: Not on file  . Years of education: Not on file  . Highest education level: Not on file  Occupational History  . Not on file  Social Needs  . Financial resource strain: Not on file  . Food insecurity:    Worry: Not on file    Inability: Not on file  . Transportation needs:  Medical: Not on file    Non-medical: Not on file  Tobacco Use  . Smoking status: Former Research scientist (life sciences)  . Smokeless tobacco: Never Used  Substance and Sexual Activity  . Alcohol use: Yes    Alcohol/week: 6.0 standard drinks    Types: 6 Cans of beer per week  . Drug use: No  . Sexual activity: Not on file  Lifestyle  . Physical activity:    Days per week: Not on file    Minutes per session: Not on file  . Stress: Not on file  Relationships  . Social connections:    Talks on phone: Not on file    Gets together: Not on file    Attends religious service: Not on file    Active member of club or organization: Not on file    Attends meetings of clubs or organizations: Not on file    Relationship status: Not on file  .  Intimate partner violence:    Fear of current or ex partner: Not on file    Emotionally abused: Not on file    Physically abused: Not on file    Forced sexual activity: Not on file  Other Topics Concern  . Not on file  Social History Narrative  . Not on file     Review of Systems  Constitutional: Negative.   HENT: Negative.   Eyes: Negative.   Respiratory: Negative.   Cardiovascular: Negative.   Gastrointestinal: Negative.   Endocrine: Negative.   Genitourinary: Negative.   Musculoskeletal: Negative.   Skin: Negative.   Allergic/Immunologic: Negative.   Neurological: Negative.   Hematological: Negative.   Psychiatric/Behavioral: Negative.   All other systems reviewed and are negative.      Objective:    Vitals:   01/15/19 1124  BP: (!) 142/78  Pulse: 72  Resp: 18  Temp: 97.9 F (36.6 C)  SpO2: 98%  Weight: 128 lb 12.8 oz (58.4 kg)  Height: 5\' 4"  (1.626 m)      Physical Exam Vitals signs and nursing note reviewed.  Constitutional:      Appearance: Normal appearance. He is well-developed. He is not ill-appearing.  HENT:     Head: Normocephalic and atraumatic.     Right Ear: External ear normal.     Left Ear: External ear normal.     Nose: Nose normal.     Mouth/Throat:     Mouth: Mucous membranes are moist.     Pharynx: Oropharynx is clear. No oropharyngeal exudate or posterior oropharyngeal erythema.  Eyes:     General:        Right eye: No discharge.        Left eye: No discharge.     Conjunctiva/sclera: Conjunctivae normal.  Neck:     Musculoskeletal: Normal range of motion and neck supple.     Trachea: No tracheal deviation.  Cardiovascular:     Rate and Rhythm: Normal rate and regular rhythm.  Pulmonary:     Effort: Pulmonary effort is normal. No respiratory distress.     Breath sounds: No stridor.  Musculoskeletal: Normal range of motion.  Skin:    General: Skin is warm and dry.     Findings: Rash present.     Comments: Single 1cm  erythematous excoriated papule to low central suprapubic area, non-tender, no induration, no active drainage or purulence  Neurological:     Mental Status: He is alert.     Motor: No abnormal muscle tone.     Coordination: Coordination  normal.  Psychiatric:        Behavior: Behavior normal.           Assessment & Plan:      ICD-10-CM   1. Rash and nonspecific skin eruption R21   2. Tick bite of abdomen, initial encounter G01.749S    W57.XXXA    Tick removed - "scratched off" more than three 3 weeks ago, he does not know how long it was on him but it was easily removed with scratching and was not embedded or engorged so I doubt that it was on him for more than 24 hours.  He complains of persistent itching with a small red raised and scratched bump to that area, he has had no worsening or spreading of the rash, no signs or symptoms of erythema migrans, no other constitutional symptoms concerning for any tickborne illness.  I suspect that a prolonged local skin reaction, advised to put topical steroid 2 times a day for less than 7 days for itching, and asked to apply antibiotic ointment once daily for 2 to 3 days to try and help the scabbed and scratched area heal and prevent infection.  Follow-up if any signs of worsening.  CDC handout given to the patient about ticks, how to remove, tickborne illness and concerning signs and symptoms which he should return to the office or call us to notify us immediately    Delsa Grana, PA-C 01/15/19 11:50 AM

## 2019-01-20 ENCOUNTER — Telehealth: Payer: Self-pay | Admitting: *Deleted

## 2019-01-20 ENCOUNTER — Other Ambulatory Visit: Payer: Self-pay | Admitting: *Deleted

## 2019-01-20 NOTE — Telephone Encounter (Signed)
Protonix is covered by insurance.

## 2019-01-20 NOTE — Telephone Encounter (Signed)
Received fax requesting alternative to Nexium as plan no longer covers medication.   MD please advise.

## 2019-01-20 NOTE — Telephone Encounter (Signed)
He has both nexium and protonix ,see which one is covered

## 2019-04-19 ENCOUNTER — Other Ambulatory Visit: Payer: Self-pay | Admitting: Family Medicine

## 2019-06-01 ENCOUNTER — Other Ambulatory Visit: Payer: Self-pay

## 2019-06-02 ENCOUNTER — Ambulatory Visit (INDEPENDENT_AMBULATORY_CARE_PROVIDER_SITE_OTHER): Payer: Medicare Other | Admitting: Family Medicine

## 2019-06-02 ENCOUNTER — Encounter: Payer: Self-pay | Admitting: Family Medicine

## 2019-06-02 VITALS — BP 140/74 | HR 66 | Temp 97.9°F | Resp 14 | Ht 64.0 in | Wt 125.0 lb

## 2019-06-02 DIAGNOSIS — I6521 Occlusion and stenosis of right carotid artery: Secondary | ICD-10-CM | POA: Diagnosis not present

## 2019-06-02 DIAGNOSIS — Z7289 Other problems related to lifestyle: Secondary | ICD-10-CM | POA: Diagnosis not present

## 2019-06-02 DIAGNOSIS — E039 Hypothyroidism, unspecified: Secondary | ICD-10-CM | POA: Diagnosis not present

## 2019-06-02 DIAGNOSIS — Z789 Other specified health status: Secondary | ICD-10-CM

## 2019-06-02 DIAGNOSIS — J410 Simple chronic bronchitis: Secondary | ICD-10-CM | POA: Diagnosis not present

## 2019-06-02 DIAGNOSIS — K2100 Gastro-esophageal reflux disease with esophagitis, without bleeding: Secondary | ICD-10-CM

## 2019-06-02 DIAGNOSIS — R030 Elevated blood-pressure reading, without diagnosis of hypertension: Secondary | ICD-10-CM | POA: Diagnosis not present

## 2019-06-02 MED ORDER — INCRUSE ELLIPTA 62.5 MCG/INH IN AEPB: 1.0000 | INHALATION_SPRAY | Freq: Every day | RESPIRATORY_TRACT | 11 refills | Status: DC

## 2019-06-02 MED ORDER — PANTOPRAZOLE SODIUM 40 MG PO TBEC: 40.0000 mg | DELAYED_RELEASE_TABLET | Freq: Every day | ORAL | 6 refills | Status: DC

## 2019-06-02 MED ORDER — ALBUTEROL SULFATE HFA 108 (90 BASE) MCG/ACT IN AERS: 2.0000 | INHALATION_SPRAY | RESPIRATORY_TRACT | 2 refills | Status: DC | PRN

## 2019-06-02 MED ORDER — LEVOTHYROXINE SODIUM 50 MCG PO TABS: 50.0000 ug | ORAL_TABLET | Freq: Every day | ORAL | 6 refills | Status: DC

## 2019-06-02 NOTE — Assessment & Plan Note (Signed)
Discussed use of incruse daily to prevent exacerbations especially goinginto fall and winter months Return for flu shot in sept

## 2019-06-02 NOTE — Assessment & Plan Note (Addendum)
Counseled on need for alcohol cessation he has cut down to 3 beers a day

## 2019-06-02 NOTE — Patient Instructions (Signed)
Return for your flu shot in Sept F/U 6 months for Physical

## 2019-06-02 NOTE — Progress Notes (Signed)
   Subjective:    Patient ID: Lee Wilson, male    DOB: 29-Nov-1945, 73 y.o.   MRN: [id number]  Patient presents for Follow-up (is fasting)   Pt here to f/u chronic medical     Was seen for tick bite in April, BP was a little elevated at that time    Hypothyroidism- taking synthroid    GERD-  Doing well on PPI no GI problems    Hyperlipidemia- taking zocor also has carotid artery disease       COPD- using incruse   Review Of Systems:  GEN- denies fatigue, fever, weight loss,weakness, recent illness HEENT- denies eye drainage, change in vision, nasal discharge, CVS- denies chest pain, palpitations RESP- denies SOB, cough, wheeze ABD- denies N/V, change in stools, abd pain GU- denies dysuria, hematuria, dribbling, incontinence MSK- denies joint pain, muscle aches, injury Neuro- denies headache, dizziness, syncope, seizure activity       Objective:    BP 140/74   Pulse 66   Temp 97.9 F (36.6 C) (Oral)   Resp 14   Ht 5\' 4"  (1.626 m)   Wt 125 lb (56.7 kg)   SpO2 97%   BMI 21.46 kg/m  GEN- NAD, alert and oriented x3 HEENT- PERRL, EOMI, non injected sclera, pink conjunctiva, MMM, oropharynx clear Neck- Supple, no thyromegaly CVS- RRR, no murmur RESP-CTAB ABD-NABS,soft,NT,ND EXT- No edema Pulses- Radial, DP- 2+           Problem List Items Addressed This Visit      Unprioritized   Alcohol use    Counseled on need for alcohol cessation he has cut down to 3 beers a day       Carotid artery disease ([place]) - Primary    Continue statin drug check LFT, he does continue to drink 3 beers a night       Relevant Orders   Comprehensive metabolic panel   Lipid panel   Chronic bronchitis ([facility])    Discussed use of incruse daily to prevent exacerbations especially goinginto fall and winter months Return for flu shot in sept      GERD (gastroesophageal reflux disease)    Continue protonix      Relevant Medications   pantoprazole (PROTONIX) 40 MG tablet   Hypothyroidism   Relevant Medications   levothyroxine (SYNTHROID) 50 MCG tablet   Other Relevant Orders   Comprehensive metabolic panel   TSH    Other Visit Diagnoses    Elevated blood pressure reading       BP mildly elevated will continue to trend,check renal function      Note: This dictation was prepared with Dragon dictation along with smaller phrase technology. Any transcriptional errors that result from this process are unintentional.

## 2019-06-02 NOTE — Assessment & Plan Note (Signed)
Continue protonix  

## 2019-06-02 NOTE — Assessment & Plan Note (Signed)
Continue statin drug check LFT, he does continue to drink 3 beers a night

## 2019-06-03 LAB — COMPREHENSIVE METABOLIC PANEL
AG Ratio: 1.2 (calc) (ref 1.0–2.5)
ALT: 21 U/L (ref 9–46)
AST: 45 U/L — ABNORMAL HIGH (ref 10–35)
Albumin: 4 g/dL (ref 3.6–5.1)
Alkaline phosphatase (APISO): 59 U/L (ref 35–144)
BUN/Creatinine Ratio: 6 (calc) (ref 6–22)
BUN: 6 mg/dL — ABNORMAL LOW (ref 7–25)
CO2: 23 mmol/L (ref 20–32)
Calcium: 9.3 mg/dL (ref 8.6–10.3)
Chloride: 90 mmol/L — ABNORMAL LOW (ref 98–110)
Creat: 0.96 mg/dL (ref 0.70–1.18)
Globulin: 3.3 g/dL (calc) (ref 1.9–3.7)
Glucose, Bld: 101 mg/dL — ABNORMAL HIGH (ref 65–99)
Potassium: 4.4 mmol/L (ref 3.5–5.3)
Sodium: 127 mmol/L — ABNORMAL LOW (ref 135–146)
Total Bilirubin: 0.5 mg/dL (ref 0.2–1.2)
Total Protein: 7.3 g/dL (ref 6.1–8.1)

## 2019-06-03 LAB — LIPID PANEL
Cholesterol: 197 mg/dL (ref <200)
HDL: 89 mg/dL (ref >=40)
LDL Cholesterol (Calc): 93 mg/dL (calc)
Non-HDL Cholesterol (Calc): 108 mg/dL (calc) (ref <130)
Total CHOL/HDL Ratio: 2.2 (calc) (ref <5.0)
Triglycerides: 68 mg/dL (ref <150)

## 2019-06-03 LAB — TSH: TSH: 20.38 mIU/L — ABNORMAL HIGH (ref 0.40–4.50)

## 2019-06-05 ENCOUNTER — Other Ambulatory Visit: Payer: Self-pay | Admitting: Family Medicine

## 2019-06-05 ENCOUNTER — Other Ambulatory Visit: Payer: Self-pay | Admitting: *Deleted

## 2019-06-05 ENCOUNTER — Telehealth: Payer: Self-pay | Admitting: *Deleted

## 2019-06-05 DIAGNOSIS — R7989 Other specified abnormal findings of blood chemistry: Secondary | ICD-10-CM

## 2019-06-05 DIAGNOSIS — E039 Hypothyroidism, unspecified: Secondary | ICD-10-CM

## 2019-06-05 DIAGNOSIS — E038 Other specified hypothyroidism: Secondary | ICD-10-CM

## 2019-06-05 MED ORDER — SPIRIVA RESPIMAT 2.5 MCG/ACT IN AERS: 2.0000 | INHALATION_SPRAY | Freq: Every day | RESPIRATORY_TRACT | 11 refills | Status: DC

## 2019-06-05 MED ORDER — LEVOTHYROXINE SODIUM 75 MCG PO TABS: 75.0000 ug | ORAL_TABLET | Freq: Every day | ORAL | 1 refills | Status: DC

## 2019-06-05 NOTE — Telephone Encounter (Signed)
You can see if we have samples, I am not sure if he is in donut hole He can try Spiriva 2.5mg  2 puffs once a day

## 2019-06-05 NOTE — Telephone Encounter (Signed)
Call placed to patient and patient made aware.   Samples placed at front desk.  

## 2019-06-05 NOTE — Telephone Encounter (Signed)
Received call from patient.   States that Incruse is >$300 and he cannot afford this.   MD please advise.

## 2019-07-29 DIAGNOSIS — Z23 Encounter for immunization: Secondary | ICD-10-CM | POA: Diagnosis not present

## 2019-11-02 ENCOUNTER — Other Ambulatory Visit: Payer: Self-pay | Admitting: Family Medicine

## 2019-12-04 ENCOUNTER — Encounter: Payer: Medicare Other | Admitting: Family Medicine

## 2019-12-09 ENCOUNTER — Encounter: Payer: Self-pay | Admitting: Family Medicine

## 2019-12-09 ENCOUNTER — Other Ambulatory Visit: Payer: Self-pay

## 2019-12-09 ENCOUNTER — Ambulatory Visit (INDEPENDENT_AMBULATORY_CARE_PROVIDER_SITE_OTHER): Payer: Medicare Other | Admitting: Family Medicine

## 2019-12-09 VITALS — BP 138/74 | HR 68 | Temp 98.1°F | Resp 16 | Ht 64.0 in | Wt 117.0 lb

## 2019-12-09 DIAGNOSIS — E441 Mild protein-calorie malnutrition: Secondary | ICD-10-CM | POA: Diagnosis not present

## 2019-12-09 DIAGNOSIS — Z23 Encounter for immunization: Secondary | ICD-10-CM

## 2019-12-09 DIAGNOSIS — I6521 Occlusion and stenosis of right carotid artery: Secondary | ICD-10-CM | POA: Diagnosis not present

## 2019-12-09 DIAGNOSIS — Z125 Encounter for screening for malignant neoplasm of prostate: Secondary | ICD-10-CM

## 2019-12-09 DIAGNOSIS — K2100 Gastro-esophageal reflux disease with esophagitis, without bleeding: Secondary | ICD-10-CM | POA: Diagnosis not present

## 2019-12-09 DIAGNOSIS — Z0000 Encounter for general adult medical examination without abnormal findings: Secondary | ICD-10-CM

## 2019-12-09 DIAGNOSIS — E039 Hypothyroidism, unspecified: Secondary | ICD-10-CM

## 2019-12-09 NOTE — Patient Instructions (Addendum)
Carotid ultrasound to be done  Drink ensure once  F/U 6 months

## 2019-12-09 NOTE — Progress Notes (Signed)
Subjective:   Patient presents for Medicare Annual/Subsequent preventive examination.   Pt here for annual wellness exam   No concerns   Chronic bronchitis- He was taking Incruse and albuterol, breathing is good, he is a non smoker  Hyperlipidemia/Carotid artery disease on zocor   GERD- on protonix  , symptoms controlled   Caraotid artery disease- on statin drug, due for repeat in 2021  Alcohol use- he has been cutting down on ETOH, drinks a 4 beers a night, his weight however is down 8 pounds since his last visit in August.  He states sometimes he just does not feel like eating other times he does have a good appetite.  He does have some Ensure at home but has not been drinking daily.   Hypothyroidism- taking levothyroxine due for repeat TFT  Also showed me where he has bruising that has not completely resolved from when he had to dog chain to wrap around his legs tight when the dogs were chasing around each other.  This was about 2 months ago.  Not have any pain but still has the hyperpigmented imprint on the back of his leg.  Review Past Medical/Family/Social: Reviewed    Risk Factors  Current exercise habits: walks and is active outside  Dietary issues discussed: Yes  Cardiac risk factors: carotid artery disease, Hyperlipidemia   Depression Screen  (Note: if answer to either of the following is "Yes", a more complete depression screening is indicated)  Over the past two weeks, have you felt down, depressed or hopeless? No Over the past two weeks, have you felt little interest or pleasure in doing things? No Have you lost interest or pleasure in daily life? No Do you often feel hopeless? No Do you cry easily over simple problems? No   Activities of Daily Living  In your present state of health, do you have any difficulty performing the following activities?:  Driving? No  Managing money? No  Feeding yourself? No  Getting from bed to chair? No  Climbing a  flight of stairs? No  Preparing food and eating?: No  Bathing or showering? No  Getting dressed: No  Getting to the toilet? No  Using the toilet:No  Moving around from place to place: No  In the past year have you fallen or had a near fall?:No  Are you sexually active? No  Do you have more than one partner? No   Hearing Difficulties: No  Do you often ask people to speak up or repeat themselves? No  Do you experience ringing or noises in your ears? No Do you have difficulty understanding soft or whispered voices? No  Do you feel that you have a problem with memory? No Do you often misplace items? No  Do you feel safe at home? Yes  Cognitive Testing  Alert? Yes Normal Appearance?Yes  Oriented to person? Yes Place? Yes  Time? Yes  Recall of three objects? Yes  Can perform simple calculations? Yes  Displays appropriate judgment?Yes  Can read the correct time from a watch face?Yes    Screening Tests / Date Cologuard UTD                   Zostavax  Declines  Pneumonia- UTD Influenza Vaccine  Due  Tetanus/tdap UTD COVID-19 discussed- pt declines   ROS: GEN- denies fatigue, fever, weight loss,weakness, recent illness HEENT- denies eye drainage, change in vision, nasal discharge, CVS- denies chest pain, palpitations RESP- denies SOB, cough, wheeze ABD- denies N/V,  change in stools, abd pain GU- denies dysuria, hematuria, dribbling, incontinence MSK- denies joint pain, muscle aches, injury Neuro- denies headache, dizziness, syncope, seizure activity  Physical- vitals reviewed, weight 8lbs   GEN- NAD, alert and oriented x3 HEENT- PERRL, EOMI, non injected sclera, pink conjunctiva, MMM, oropharynx clear, TM clear no effusion, nares clear  Neck- Supple, no thryomegaly,carotid bruit  CVS- RRR, no murmur RESP-CTAB ABD-NABS,soft,nt,nd EXT- No edema Skin hyperpigmented brownish imprint of chain like pattern on post thigh down to upper calf bilat  Pulses- Radial, DP-  2+     Assessment:    Annual wellness medicare exam   Plan:    During the course of the visit the patient was educated and counseled about appropriate screening and preventive services including:    CAGE score 5- Discussed cutting back alcohol again, now drinking 6 beers a day, recommend cut down to 3 a day which have discussed before in the past.  Also discussed how his alcohol is contributing to his protein calorie malnutrition and weight loss as he is not eating very much because he is drinking.    Chronic bronchitis- continue incruse, Flu shot given  Hypothyroidism- recheck TSH, continue synthroid   GERD he continues to benefit from PPI will continue/in the setting of his drinking  depression screen neg  Discussed advanced directives will discuss paperwork with daughter however right now he is at odds with his daughter over some financial issues.  PSA screening to be done   Carotid artery disesae/hyperlipidemia- continue statin, carotid ultrasound to be set up.   Patient Instructions (the written plan) was given to the patient.  Medicare Attestation  I have personally reviewed:  The patient's medical and social history  Their use of alcohol, tobacco or illicit drugs  Their current medications and supplements  The patient's functional ability including ADLs,fall risks, home safety risks, cognitive, and hearing and visual impairment  Diet and physical activities  Evidence for depression or mood disorders  The patient's weight, height, BMI, and visual acuity have been recorded in the chart. I have made referrals, counseling, and provided education to the patient based on review of the above and I have provided the patient with a written personalized care plan for preventive services.

## 2019-12-10 LAB — COMPREHENSIVE METABOLIC PANEL
AG Ratio: 1.2 (calc) (ref 1.0–2.5)
ALT: 9 U/L (ref 9–46)
AST: 22 U/L (ref 10–35)
Albumin: 3.7 g/dL (ref 3.6–5.1)
Alkaline phosphatase (APISO): 49 U/L (ref 35–144)
BUN: 7 mg/dL (ref 7–25)
CO2: 24 mmol/L (ref 20–32)
Calcium: 9.1 mg/dL (ref 8.6–10.3)
Chloride: 100 mmol/L (ref 98–110)
Creat: 0.94 mg/dL (ref 0.70–1.18)
Globulin: 3.1 g/dL (calc) (ref 1.9–3.7)
Glucose, Bld: 76 mg/dL (ref 65–99)
Potassium: 4 mmol/L (ref 3.5–5.3)
Sodium: 133 mmol/L — ABNORMAL LOW (ref 135–146)
Total Bilirubin: 0.6 mg/dL (ref 0.2–1.2)
Total Protein: 6.8 g/dL (ref 6.1–8.1)

## 2019-12-10 LAB — LIPID PANEL
Cholesterol: 195 mg/dL (ref <200)
HDL: 75 mg/dL (ref >=40)
LDL Cholesterol (Calc): 104 mg/dL (calc) — ABNORMAL HIGH
Non-HDL Cholesterol (Calc): 120 mg/dL (calc) (ref <130)
Total CHOL/HDL Ratio: 2.6 (calc) (ref <5.0)
Triglycerides: 71 mg/dL (ref <150)

## 2019-12-10 LAB — CBC WITH DIFFERENTIAL/PLATELET
Absolute Monocytes: 689 cells/uL (ref 200–950)
Basophils Absolute: 39 cells/uL (ref 0–200)
Basophils Relative: 0.7 %
Eosinophils Absolute: 168 cells/uL (ref 15–500)
Eosinophils Relative: 3 %
HCT: 37.4 % — ABNORMAL LOW (ref 38.5–50.0)
Hemoglobin: 13.1 g/dL — ABNORMAL LOW (ref 13.2–17.1)
Lymphs Abs: 2094 cells/uL (ref 850–3900)
MCH: 34.9 pg — ABNORMAL HIGH (ref 27.0–33.0)
MCHC: 35 g/dL (ref 32.0–36.0)
MCV: 99.7 fL (ref 80.0–100.0)
MPV: 9.9 fL (ref 7.5–12.5)
Monocytes Relative: 12.3 %
Neutro Abs: 2610 cells/uL (ref 1500–7800)
Neutrophils Relative %: 46.6 %
Platelets: 162 10*3/uL (ref 140–400)
RBC: 3.75 10*6/uL — ABNORMAL LOW (ref 4.20–5.80)
RDW: 12.6 % (ref 11.0–15.0)
Total Lymphocyte: 37.4 %
WBC: 5.6 10*3/uL (ref 3.8–10.8)

## 2019-12-10 LAB — TSH: TSH: 2.64 mIU/L (ref 0.40–4.50)

## 2019-12-10 LAB — PSA: PSA: 0.5 ng/mL (ref <=4.0)

## 2019-12-10 LAB — T4, FREE: Free T4: 1.1 ng/dL (ref 0.8–1.8)

## 2019-12-11 ENCOUNTER — Other Ambulatory Visit: Payer: Self-pay | Admitting: *Deleted

## 2019-12-11 MED ORDER — PANTOPRAZOLE SODIUM 40 MG PO TBEC: 40.0000 mg | DELAYED_RELEASE_TABLET | Freq: Every day | ORAL | 6 refills | Status: DC

## 2020-01-28 ENCOUNTER — Encounter: Payer: Self-pay | Admitting: Nurse Practitioner

## 2020-01-28 ENCOUNTER — Other Ambulatory Visit: Payer: Self-pay

## 2020-01-28 ENCOUNTER — Ambulatory Visit (INDEPENDENT_AMBULATORY_CARE_PROVIDER_SITE_OTHER): Payer: Medicare Other | Admitting: Nurse Practitioner

## 2020-01-28 VITALS — BP 104/54 | HR 76 | Temp 97.6°F | Resp 18 | Wt 116.0 lb

## 2020-01-28 DIAGNOSIS — M5416 Radiculopathy, lumbar region: Secondary | ICD-10-CM | POA: Diagnosis not present

## 2020-01-28 MED ORDER — PREDNISONE 10 MG PO TABS: ORAL_TABLET | ORAL | 0 refills | Status: DC

## 2020-01-28 NOTE — Patient Instructions (Signed)
May take over the counter Tylenol/Acetamenophen or Ibuprofen/Motrin as needed for pain.  Use ice and/or heating pad for pain relief.  Start and complete prednisone as directed sent to pharmacy.   Educational print out discussed and printed for you today.  Follow up for non resolving or worsening symptoms.

## 2020-01-28 NOTE — Progress Notes (Addendum)
Established Patient Office Visit  Subjective:  Patient ID: Lee Wilson, male    DOB: July 09, 1946  Age: 74 y.o. MRN: [id number]  CC:  Chief Complaint  Patient presents with  . Leg Pain    R flank pain radiating down the leg, started x1 week, no meds    HPI SHADY AKE is a 74 year old male presenting to clinic for right lumbar pain that radiate down the right leg when he bends or turns. The sxs started a week ago after mowing grass. He has used Ibuprofen with temporary minimal relief. He is asking for prescription to help if possible. Discussed risk vs benefit of steroid use and he verbalizes and understanding. No cp/ct, gu/gi sxs, other pain, no falls or injury recently, edema, sob.   Past Medical History:  Diagnosis Date  . Asthma   . GERD (gastroesophageal reflux disease)     Past Surgical History:  Procedure Laterality Date  . FRACTURE SURGERY      No family history on file.  Social History   Socioeconomic History  . Marital status: Widowed    Spouse name: Not on file  . Number of children: Not on file  . Years of education: Not on file  . Highest education level: Not on file  Occupational History  . Not on file  Tobacco Use  . Smoking status: Former Research scientist (life sciences)  . Smokeless tobacco: Never Used  Substance and Sexual Activity  . Alcohol use: Yes    Alcohol/week: 6.0 standard drinks    Types: 6 Cans of beer per week  . Drug use: No  . Sexual activity: Not on file  Other Topics Concern  . Not on file  Social History Narrative  . Not on file   Social Determinants of Health   Financial Resource Strain:   . Difficulty of Paying Living Expenses:   Food Insecurity:   . Worried About Charity fundraiser in the Last Year:   . Arboriculturist in the Last Year:   Transportation Needs:   . Film/video editor (Medical):   Marland Kitchen Lack of Transportation (Non-Medical):   Physical Activity:   . Days of Exercise per Week:   . Minutes of Exercise per Session:    Stress:   . Feeling of Stress :   Social Connections:   . Frequency of Communication with Friends and Family:   . Frequency of Social Gatherings with Friends and Family:   . Attends Religious Services:   . Active Member of Clubs or Organizations:   . Attends Archivist Meetings:   Marland Kitchen Marital Status:   Intimate Partner Violence:   . Fear of Current or Ex-Partner:   . Emotionally Abused:   Marland Kitchen Physically Abused:   . Sexually Abused:     Outpatient Medications Prior to Visit  Medication Sig Dispense Refill  . albuterol (VENTOLIN HFA) 108 (90 Base) MCG/ACT inhaler Inhale 2 puffs into the lungs every 4 (four) hours as needed for wheezing or shortness of breath. 18 g 2  . cetirizine (ZYRTEC) 10 MG tablet Take 1 tablet (10 mg total) by mouth daily. 30 tablet 11  . hydrocortisone 2.5 % cream Apply topically 2 (two) times daily. Apply to face 30 g 2  . levothyroxine (SYNTHROID) 75 MCG tablet TAKE 1 TABLET(75 MCG) BY MOUTH DAILY BEFORE BREAKFAST 90 tablet 0  . pantoprazole (PROTONIX) 40 MG tablet Take 1 tablet (40 mg total) by mouth daily. 30 tablet 6  .  simvastatin (ZOCOR) 20 MG tablet Take 1 tablet (20 mg total) by mouth at bedtime. 90 tablet 3  . Tiotropium Bromide Monohydrate (SPIRIVA RESPIMAT) 2.5 MCG/ACT AERS Inhale 2 puffs into the lungs daily. 4 g 11   No facility-administered medications prior to visit.    Allergies  Allergen Reactions  . Penicillin G Other (See Comments)    ROS Review of Systems  All other systems reviewed and are negative.     Objective:    Physical Exam  Constitutional: He is oriented to person, place, and time. He appears well-developed.  HENT:  Head: Normocephalic.  Eyes: Pupils are equal, round, and reactive to light. Conjunctivae and EOM are normal.  Cardiovascular: Normal rate.  Pulmonary/Chest: Effort normal.  Musculoskeletal:     Cervical back: Normal range of motion and neck supple.     Lumbar back: Pain present. No swelling,  edema, deformity, tenderness or bony tenderness. Decreased range of motion.  Neurological: He is alert and oriented to person, place, and time.  Skin: Skin is warm and dry.  Psychiatric: He has a normal mood and affect. His speech is normal and behavior is normal. Judgment and thought content normal. Cognition and memory are normal.  Nursing note and vitals reviewed.   BP (!) 104/54 (BP Location: Left Arm, Patient Position: Sitting, Cuff Size: Normal)   Pulse 76   Temp 97.6 F (36.4 C) (Temporal)   Resp 18   Wt 116 lb (52.6 kg)   SpO2 97%   BMI 19.91 kg/m  Wt Readings from Last 3 Encounters:  01/28/20 116 lb (52.6 kg)  12/09/19 117 lb (53.1 kg)  06/02/19 125 lb (56.7 kg)    Lab Results  Component Value Date   TSH 2.64 12/09/2019   Lab Results  Component Value Date   WBC 5.6 12/09/2019   HGB 13.1 (L) 12/09/2019   HCT 37.4 (L) 12/09/2019   MCV 99.7 12/09/2019   PLT 162 12/09/2019   Lab Results  Component Value Date   NA 133 (L) 12/09/2019   K 4.0 12/09/2019   CO2 24 12/09/2019   GLUCOSE 76 12/09/2019   BUN 7 12/09/2019   CREATININE 0.94 12/09/2019   BILITOT 0.6 12/09/2019   ALKPHOS 56 09/10/2016   AST 22 12/09/2019   ALT 9 12/09/2019   PROT 6.8 12/09/2019   ALBUMIN 3.5 (L) 09/10/2016   CALCIUM 9.1 12/09/2019   Lab Results  Component Value Date   CHOL 195 12/09/2019   Lab Results  Component Value Date   HDL 75 12/09/2019   Lab Results  Component Value Date   LDLCALC 104 (H) 12/09/2019   Lab Results  Component Value Date   TRIG 71 12/09/2019   Lab Results  Component Value Date   CHOLHDL 2.6 12/09/2019     Assessment & Plan:   Problem List Items Addressed This Visit    None    Visit Diagnoses    Lumbar radiculopathy    -  Primary   Relevant Medications   predniSONE (DELTASONE) 10 MG tablet    May take over the counter Tylenol/Acetamenophen or Ibuprofen/Motrin as needed for pain.  Use ice and/or heating pad for pain relief.  Start and  complete prednisone as directed sent to pharmacy.   Educational print out discussed and printed for you today.  Follow up for non resolving or worsening symptoms. (copied from Patient instructions types by provider)  Rest.  Meds ordered this encounter  Medications  . predniSONE (DELTASONE) 10 MG tablet  Sig: Day one and Two take 4 tablets Day three and Four take 3 tablets Day five take 2 tablets Day six take 1 tablet    Dispense:  17 tablet    Refill:  0    Follow-up: Return if symptoms worsen or fail to improve.    Annie Main, FNP

## 2020-03-22 DIAGNOSIS — Z23 Encounter for immunization: Secondary | ICD-10-CM | POA: Diagnosis not present

## 2020-06-08 ENCOUNTER — Ambulatory Visit (INDEPENDENT_AMBULATORY_CARE_PROVIDER_SITE_OTHER): Payer: Medicare Other | Admitting: Family Medicine

## 2020-06-08 ENCOUNTER — Encounter: Payer: Self-pay | Admitting: Family Medicine

## 2020-06-08 ENCOUNTER — Other Ambulatory Visit: Payer: Self-pay

## 2020-06-08 VITALS — BP 102/60 | HR 72 | Temp 98.3°F | Resp 16 | Ht 64.0 in | Wt 115.0 lb

## 2020-06-08 DIAGNOSIS — Z7289 Other problems related to lifestyle: Secondary | ICD-10-CM

## 2020-06-08 DIAGNOSIS — L570 Actinic keratosis: Secondary | ICD-10-CM

## 2020-06-08 DIAGNOSIS — I6521 Occlusion and stenosis of right carotid artery: Secondary | ICD-10-CM | POA: Diagnosis not present

## 2020-06-08 DIAGNOSIS — J410 Simple chronic bronchitis: Secondary | ICD-10-CM | POA: Diagnosis not present

## 2020-06-08 DIAGNOSIS — D229 Melanocytic nevi, unspecified: Secondary | ICD-10-CM | POA: Diagnosis not present

## 2020-06-08 DIAGNOSIS — J309 Allergic rhinitis, unspecified: Secondary | ICD-10-CM | POA: Diagnosis not present

## 2020-06-08 DIAGNOSIS — E039 Hypothyroidism, unspecified: Secondary | ICD-10-CM | POA: Diagnosis not present

## 2020-06-08 DIAGNOSIS — Z789 Other specified health status: Secondary | ICD-10-CM

## 2020-06-08 MED ORDER — ALBUTEROL SULFATE HFA 108 (90 BASE) MCG/ACT IN AERS: 2.0000 | INHALATION_SPRAY | RESPIRATORY_TRACT | 2 refills | Status: DC | PRN

## 2020-06-08 MED ORDER — CETIRIZINE HCL 10 MG PO TABS: 10.0000 mg | ORAL_TABLET | Freq: Every day | ORAL | 11 refills | Status: DC

## 2020-06-08 MED ORDER — SIMVASTATIN 20 MG PO TABS: 20.0000 mg | ORAL_TABLET | Freq: Every day | ORAL | 3 refills | Status: DC

## 2020-06-08 MED ORDER — SPIRIVA RESPIMAT 2.5 MCG/ACT IN AERS
2.0000 | INHALATION_SPRAY | Freq: Every day | RESPIRATORY_TRACT | 11 refills | Status: DC
Start: 2020-06-08 — End: 2020-12-09

## 2020-06-08 MED ORDER — PANTOPRAZOLE SODIUM 40 MG PO TBEC: 40.0000 mg | DELAYED_RELEASE_TABLET | Freq: Every day | ORAL | 6 refills | Status: DC

## 2020-06-08 MED ORDER — LEVOTHYROXINE SODIUM 75 MCG PO TABS: ORAL_TABLET | ORAL | 1 refills | Status: DC

## 2020-06-08 NOTE — Assessment & Plan Note (Signed)
Continue to recommend cutting down on ETOH

## 2020-06-08 NOTE — Assessment & Plan Note (Signed)
Reschedule US carotids Continue zocor Check lipids

## 2020-06-08 NOTE — Assessment & Plan Note (Signed)
No recent exacerbations, continue spriva

## 2020-06-08 NOTE — Patient Instructions (Signed)
F/U 6 months for Physical Carotid ultrasound to be done

## 2020-06-08 NOTE — Assessment & Plan Note (Signed)
Continue replacement  Head pain today, normal exam, no sign of infection, if more persistant will work up further

## 2020-06-08 NOTE — Progress Notes (Signed)
   Subjective:    Patient ID: Lee Wilson, male    DOB: [birth year], 74 y.o.   MRN: [id number]  Patient presents for Follow-up (is fasting) and Head Pain (intermittent sharp pain in R frontal sinus no other sx)  Pt here with sharp pain above right eye, points to frontal head,he had a sharp pain this AM,   No change in vision No cough or congestion, no sinus pressure  He had a fall 6 months ago, while walking the dog   COPD- using spiriva  Hypothyrodism- taking levothyroxine75mg   first ting in AM due for recheck   GERD- taking protonix   Carotid arteyr /Hyperlipidemia on zocor    States his appetite is good    Review Of Systems:  GEN- denies fatigue, fever, weight loss,weakness, recent illness HEENT- denies eye drainage, change in vision, nasal discharge, CVS- denies chest pain, palpitations RESP- denies SOB, cough, wheeze ABD- denies N/V, change in stools, abd pain GU- denies dysuria, hematuria, dribbling, incontinence MSK- denies joint pain, muscle aches, injury Neuro- denies headache, dizziness, syncope, seizure activity       Objective:    BP 102/60   Pulse 72   Temp 98.3 F (36.8 C) (Temporal)   Resp 16   Ht 5\' 4"  (1.626 m)   Wt 115 lb (52.2 kg)   SpO2 96%   BMI 19.74 kg/m  GEN- NAD, alert and oriented x3 HEENT- PERRL, EOMI, non injected sclera, pink conjunctiva, MMM, oropharynx clear , no sinus tenderness, TM clear no ffusion  Neck- Supple, no thyromegaly CVS- RRR, no murmur RESP-CTAB ABD-NABS,soft,NT,ND NEURO CNII-XII in tact Skin- multiple AK lesions on face, scalp, erythematous scaley lesions beneath right ear lobe  EXT- No edema Pulses- Radial, DP- 2+        Assessment & Plan:      Problem List Items Addressed This Visit      Unprioritized   Alcohol use    Continue to recommend cutting down on ETOH      Relevant Orders   Comprehensive metabolic panel   Carotid artery disease (HCC) - Primary    Reschedule US carotids Continue  zocor Check lipids       Relevant Medications   simvastatin (ZOCOR) 20 MG tablet   Other Relevant Orders   CBC with Differential/Platelet   Comprehensive metabolic panel   Lipid panel   US Carotid Duplex Bilateral   Chronic bronchitis (HCC)    No recent exacerbations, continue spriva      Hypothyroidism    Continue replacement  Head pain today, normal exam, no sign of infection, if more persistant will work up further       Relevant Medications   levothyroxine (SYNTHROID) 75 MCG tablet   Other Relevant Orders   TSH    Other Visit Diagnoses    Allergic rhinitis       Relevant Medications   cetirizine (ZYRTEC) 10 MG tablet   AK (actinic keratosis)       Noted during exam , multiple lesions on face, 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 Any transcriptional errors that result from this process are unintentional.

## 2020-06-09 LAB — LIPID PANEL
Cholesterol: 209 mg/dL — ABNORMAL HIGH (ref <200)
HDL: 72 mg/dL (ref >=40)
LDL Cholesterol (Calc): 111 mg/dL (calc) — ABNORMAL HIGH
Non-HDL Cholesterol (Calc): 137 mg/dL (calc) — ABNORMAL HIGH (ref <130)
Total CHOL/HDL Ratio: 2.9 (calc) (ref <5.0)
Triglycerides: 151 mg/dL — ABNORMAL HIGH (ref <150)

## 2020-06-09 LAB — CBC WITH DIFFERENTIAL/PLATELET
Absolute Monocytes: 456 cells/uL (ref 200–950)
Basophils Absolute: 38 cells/uL (ref 0–200)
Basophils Relative: 0.8 %
Eosinophils Absolute: 160 cells/uL (ref 15–500)
Eosinophils Relative: 3.4 %
HCT: 36.7 % — ABNORMAL LOW (ref 38.5–50.0)
Hemoglobin: 12.6 g/dL — ABNORMAL LOW (ref 13.2–17.1)
Lymphs Abs: 2369 cells/uL (ref 850–3900)
MCH: 34.7 pg — ABNORMAL HIGH (ref 27.0–33.0)
MCHC: 34.3 g/dL (ref 32.0–36.0)
MCV: 101.1 fL — ABNORMAL HIGH (ref 80.0–100.0)
MPV: 9.6 fL (ref 7.5–12.5)
Monocytes Relative: 9.7 %
Neutro Abs: 1678 cells/uL (ref 1500–7800)
Neutrophils Relative %: 35.7 %
Platelets: 168 10*3/uL (ref 140–400)
RBC: 3.63 10*6/uL — ABNORMAL LOW (ref 4.20–5.80)
RDW: 12.7 % (ref 11.0–15.0)
Total Lymphocyte: 50.4 %
WBC: 4.7 10*3/uL (ref 3.8–10.8)

## 2020-06-09 LAB — TSH: TSH: 3.11 mIU/L (ref 0.40–4.50)

## 2020-06-09 LAB — COMPREHENSIVE METABOLIC PANEL
AG Ratio: 1.5 (calc) (ref 1.0–2.5)
ALT: 7 U/L — ABNORMAL LOW (ref 9–46)
AST: 20 U/L (ref 10–35)
Albumin: 3.8 g/dL (ref 3.6–5.1)
Alkaline phosphatase (APISO): 41 U/L (ref 35–144)
BUN: 7 mg/dL (ref 7–25)
CO2: 25 mmol/L (ref 20–32)
Calcium: 8.7 mg/dL (ref 8.6–10.3)
Chloride: 98 mmol/L (ref 98–110)
Creat: 0.95 mg/dL (ref 0.70–1.18)
Globulin: 2.6 g/dL (calc) (ref 1.9–3.7)
Glucose, Bld: 69 mg/dL (ref 65–99)
Potassium: 3.8 mmol/L (ref 3.5–5.3)
Sodium: 133 mmol/L — ABNORMAL LOW (ref 135–146)
Total Bilirubin: 0.4 mg/dL (ref 0.2–1.2)
Total Protein: 6.4 g/dL (ref 6.1–8.1)

## 2020-06-10 ENCOUNTER — Other Ambulatory Visit: Payer: Self-pay | Admitting: *Deleted

## 2020-06-10 MED ORDER — SIMVASTATIN 40 MG PO TABS
40.0000 mg | ORAL_TABLET | Freq: Every day | ORAL | 3 refills | Status: DC
Start: 2020-06-10 — End: 2020-12-09

## 2020-06-22 ENCOUNTER — Other Ambulatory Visit: Payer: Self-pay

## 2020-06-22 ENCOUNTER — Ambulatory Visit (HOSPITAL_COMMUNITY)
Admission: RE | Admit: 2020-06-22 | Discharge: 2020-06-22 | Disposition: A | Discharge status: Home / Self Care | Payer: Medicare Other | Source: Ambulatory Visit | Attending: Family Medicine | Admitting: Family Medicine

## 2020-06-22 DIAGNOSIS — I6521 Occlusion and stenosis of right carotid artery: Secondary | ICD-10-CM | POA: Diagnosis not present

## 2020-06-22 DIAGNOSIS — I6523 Occlusion and stenosis of bilateral carotid arteries: Secondary | ICD-10-CM | POA: Diagnosis not present

## 2020-10-19 ENCOUNTER — Ambulatory Visit: Payer: Medicare Other | Admitting: Dermatology

## 2020-12-09 ENCOUNTER — Other Ambulatory Visit: Payer: Self-pay

## 2020-12-09 ENCOUNTER — Ambulatory Visit (INDEPENDENT_AMBULATORY_CARE_PROVIDER_SITE_OTHER): Payer: Medicare Other | Admitting: Family Medicine

## 2020-12-09 ENCOUNTER — Encounter: Payer: Self-pay | Admitting: Family Medicine

## 2020-12-09 VITALS — BP 118/64 | HR 80 | Temp 98.2°F | Resp 16 | Ht 64.0 in | Wt 115.0 lb

## 2020-12-09 DIAGNOSIS — Z7289 Other problems related to lifestyle: Secondary | ICD-10-CM

## 2020-12-09 DIAGNOSIS — Z125 Encounter for screening for malignant neoplasm of prostate: Secondary | ICD-10-CM

## 2020-12-09 DIAGNOSIS — K2100 Gastro-esophageal reflux disease with esophagitis, without bleeding: Secondary | ICD-10-CM | POA: Diagnosis not present

## 2020-12-09 DIAGNOSIS — Z0001 Encounter for general adult medical examination with abnormal findings: Secondary | ICD-10-CM

## 2020-12-09 DIAGNOSIS — I6521 Occlusion and stenosis of right carotid artery: Secondary | ICD-10-CM

## 2020-12-09 DIAGNOSIS — Z789 Other specified health status: Secondary | ICD-10-CM

## 2020-12-09 DIAGNOSIS — E039 Hypothyroidism, unspecified: Secondary | ICD-10-CM

## 2020-12-09 DIAGNOSIS — J410 Simple chronic bronchitis: Secondary | ICD-10-CM

## 2020-12-09 DIAGNOSIS — J309 Allergic rhinitis, unspecified: Secondary | ICD-10-CM

## 2020-12-09 DIAGNOSIS — M5136 Other intervertebral disc degeneration, lumbar region: Secondary | ICD-10-CM

## 2020-12-09 DIAGNOSIS — Z23 Encounter for immunization: Secondary | ICD-10-CM | POA: Diagnosis not present

## 2020-12-09 DIAGNOSIS — M51369 Other intervertebral disc degeneration, lumbar region without mention of lumbar back pain or lower extremity pain: Secondary | ICD-10-CM

## 2020-12-09 DIAGNOSIS — E441 Mild protein-calorie malnutrition: Secondary | ICD-10-CM

## 2020-12-09 DIAGNOSIS — Z0000 Encounter for general adult medical examination without abnormal findings: Secondary | ICD-10-CM

## 2020-12-09 DIAGNOSIS — Z1211 Encounter for screening for malignant neoplasm of colon: Secondary | ICD-10-CM

## 2020-12-09 DIAGNOSIS — F1090 Alcohol use, unspecified, uncomplicated: Secondary | ICD-10-CM

## 2020-12-09 MED ORDER — DICLOFENAC SODIUM 1 % EX GEL: CUTANEOUS | 2 refills | Status: AC

## 2020-12-09 MED ORDER — ALBUTEROL SULFATE HFA 108 (90 BASE) MCG/ACT IN AERS: 2.0000 | INHALATION_SPRAY | RESPIRATORY_TRACT | 2 refills | Status: DC | PRN

## 2020-12-09 MED ORDER — PANTOPRAZOLE SODIUM 40 MG PO TBEC: 40.0000 mg | DELAYED_RELEASE_TABLET | Freq: Every day | ORAL | 1 refills | Status: DC

## 2020-12-09 MED ORDER — DICLOFENAC SODIUM 1 % EX GEL: CUTANEOUS | 2 refills | Status: DC

## 2020-12-09 MED ORDER — LEVOTHYROXINE SODIUM 75 MCG PO TABS
ORAL_TABLET | ORAL | 1 refills | Status: DC
Start: 2020-12-09 — End: 2021-02-16

## 2020-12-09 MED ORDER — CETIRIZINE HCL 10 MG PO TABS: 10.0000 mg | ORAL_TABLET | Freq: Every day | ORAL | 11 refills | Status: DC

## 2020-12-09 MED ORDER — SPIRIVA RESPIMAT 2.5 MCG/ACT IN AERS: 2.0000 | INHALATION_SPRAY | Freq: Every day | RESPIRATORY_TRACT | 11 refills | Status: DC

## 2020-12-09 MED ORDER — SIMVASTATIN 40 MG PO TABS: 40.0000 mg | ORAL_TABLET | Freq: Every day | ORAL | 3 refills | Status: DC

## 2020-12-09 NOTE — Addendum Note (Signed)
Addended by: Vic Blackbird F on: 12/09/2020 04:01 PM   Modules accepted: Orders

## 2020-12-09 NOTE — Progress Notes (Signed)
Subjective:   Patient presents for Medicare Annual/Subsequent preventive examination.  Pt here for annual wellness exam  Sometimes when he lays in the the bed he gets sharp pain the left leg, no injury It has been going on for 2 months or more.  He had an injury years ago when he fell off a ladder.  He is also at follow-up while walking his dog for the past and will trigger the same pain.   COPD - using albuterol, he could not afford spiriva  breathing is good, he is a non smoke  Hyperlipidemia/Carotid artery disease on zocor   GERD- on protonix  , symptoms controlled  Caraotid artery disease- on statin drug, had repeat US of carotid arteries in  2021 which was stable   Alcohol use- he has been cutting down on ETOH, drinks a 4 beers a night,     Hypothyroidism- taking levothyroxine due for repeat TFT     Review Past Medical/Family/Social:Reviewed   Risk Factors Current exercise habits:walks and is active outside Dietary issues discussed:Yes  Cardiac risk factors:carotid artery disease, Hyperlipidemia  Depression Screen (Note: if answer to either of the following is "Yes", a more complete depression screening is indicated)  Over the past two weeks, have you felt down, depressed or hopeless? No Over the past two weeks, have you felt little interest or pleasure in doing things? No Have you lost interest or pleasure in daily life? No Do you often feel hopeless? No Do you cry easily over simple problems?No   Activities of Daily Living In your present state of health, do you have any difficulty performing the following activities?:  Driving? No  Managing money? No  Feeding yourself? No  Getting from bed to chair? No  Climbing a flight of stairs? No  Preparing food and eating?: No  Bathing or showering? No  Getting dressed: No  Getting to the toilet? No  Using the toilet:No  Moving around from place to place: No  In the past year have you  fallen or had a near fall?:No     Hearing Difficulties:No  Do you often ask people to speak up or repeat themselves? No  Do you experience ringing or noises in your ears? No Do you have difficulty understanding soft or whispered voices? No  Do you feel that you have a problem with memory? No Do you often misplace items? No  Do you feel safe at home?Yes  Cognitive Testing Alert? Yes Normal Appearance?Yes  Oriented to person? Yes Place? Yes  Time? Yes  Recall of three objects? Yes  Can perform simple calculations? Yes  Displays appropriate judgment?Yes  Can read the correct time from a watch face?Yes    Screening Tests / Date Cologuard UTD2018 ZostavaxDeclines  Pneumonia- UTD Influenza VaccineDue Tetanus/tdapUTD COVID-19 - first 2 vaccines UTD  ROS: GEN- denies fatigue, fever, weight loss,weakness, recent illness HEENT- denies eye drainage, change in vision, nasal discharge, CVS- denies chest pain, palpitations RESP- denies SOB, cough, wheeze ABD- denies N/V, change in stools, abd pain GU- denies dysuria, hematuria, dribbling, incontinence MSK- denies joint pain, muscle aches, injury Neuro- denies headache, dizziness, syncope, seizure activity  Physical- vitals reviewed,  GEN- NAD, alert and oriented x3 HEENT- PERRL, EOMI, non injected sclera, pink conjunctiva, MMM, oropharynx clear, TM clear no effusion, nares clear Neck- Supple, no thryomegaly,carotid bruit CVS- RRR, no murmur RESP-CTAB ABD-NABS,soft,nt,nd EXT- No edema PSYCH-normal affect and mood very pleasant MSK- Spine NT, fair ROM, neg SLR, fair ROM hips  Pulses-  Radial, DP- 2+     Assessment:   Annual wellness medicare exam   Plan:   During the course of the visit the patient was educated and counseled about appropriate screening and preventive services including:  Preventation-  Flu shot given, Cologuard sent for colon cancer screening  Chronic  bronchitis- Given  Samples of Trelegy for now will refer to [facility] for assistance   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  He can also use topicals.  He prefers not to take any more pills.  He has no red flags and he has degenerative changes medical imaging will not send her for x-rays today.  Hypothyroidism- recheck TSH, continue synthroid  GERD he continues to benefit from PPI will continue/in the setting of his drinking  Depression/fall screen negative  HLD/ carotid artery disease   Protein calorie malnution fortunately he does continue to drink that he has been stating that 4 beers a day.   He is goint to set up appt with eye doctor, eyes occ blurry   CODE -full code     Diet review for nutrition referral? Yes ____ Not Indicated __x__  Patient Instructions (the written plan) was given to the patient.  Medicare Attestation  I have personally reviewed:  The patient's medical and social history  Their use of alcohol, tobacco or illicit drugs  Their current medications and supplements  The patient's functional ability including ADLs,fall risks, home safety risks, cognitive, and hearing and visual impairment  Diet and physical activities  Evidence for depression or mood disorders  The patient's weight, height, BMI, and visual acuity have been recorded in the chart. I have made referrals, counseling, and provided education to the patient based on review of the above and I have provided the patient with a written personalized care plan for preventive services.

## 2020-12-09 NOTE — Patient Instructions (Addendum)
F/U Lee Wilson 6 months  cologuard sent  Schedule eye doctor appointment Flu shot

## 2020-12-10 LAB — CBC WITH DIFFERENTIAL/PLATELET
Absolute Monocytes: 539 cells/uL (ref 200–950)
Basophils Absolute: 39 cells/uL (ref 0–200)
Basophils Relative: 0.8 %
Eosinophils Absolute: 127 cells/uL (ref 15–500)
Eosinophils Relative: 2.6 %
HCT: 41.2 % (ref 38.5–50.0)
Hemoglobin: 14 g/dL (ref 13.2–17.1)
Lymphs Abs: 1833 cells/uL (ref 850–3900)
MCH: 34.1 pg — ABNORMAL HIGH (ref 27.0–33.0)
MCHC: 34 g/dL (ref 32.0–36.0)
MCV: 100.2 fL — ABNORMAL HIGH (ref 80.0–100.0)
MPV: 9.6 fL (ref 7.5–12.5)
Monocytes Relative: 11 %
Neutro Abs: 2362 cells/uL (ref 1500–7800)
Neutrophils Relative %: 48.2 %
Platelets: 186 10*3/uL (ref 140–400)
RBC: 4.11 10*6/uL — ABNORMAL LOW (ref 4.20–5.80)
RDW: 12.7 % (ref 11.0–15.0)
Total Lymphocyte: 37.4 %
WBC: 4.9 10*3/uL (ref 3.8–10.8)

## 2020-12-10 LAB — COMPREHENSIVE METABOLIC PANEL
AG Ratio: 1.4 (calc) (ref 1.0–2.5)
ALT: 8 U/L — ABNORMAL LOW (ref 9–46)
AST: 17 U/L (ref 10–35)
Albumin: 4.1 g/dL (ref 3.6–5.1)
Alkaline phosphatase (APISO): 48 U/L (ref 35–144)
BUN: 10 mg/dL (ref 7–25)
CO2: 25 mmol/L (ref 20–32)
Calcium: 9.5 mg/dL (ref 8.6–10.3)
Chloride: 100 mmol/L (ref 98–110)
Creat: 0.92 mg/dL (ref 0.70–1.18)
Globulin: 2.9 g/dL (calc) (ref 1.9–3.7)
Glucose, Bld: 85 mg/dL (ref 65–99)
Potassium: 4.6 mmol/L (ref 3.5–5.3)
Sodium: 135 mmol/L (ref 135–146)
Total Bilirubin: 0.5 mg/dL (ref 0.2–1.2)
Total Protein: 7 g/dL (ref 6.1–8.1)

## 2020-12-10 LAB — PSA: PSA: 0.33 ng/mL (ref <=4.0)

## 2020-12-10 LAB — LIPID PANEL
Cholesterol: 234 mg/dL — ABNORMAL HIGH (ref <200)
HDL: 79 mg/dL (ref >=40)
LDL Cholesterol (Calc): 137 mg/dL (calc) — ABNORMAL HIGH
Non-HDL Cholesterol (Calc): 155 mg/dL (calc) — ABNORMAL HIGH (ref <130)
Total CHOL/HDL Ratio: 3 (calc) (ref <5.0)
Triglycerides: 84 mg/dL (ref <150)

## 2020-12-10 LAB — TSH: TSH: 10.12 mIU/L — ABNORMAL HIGH (ref 0.40–4.50)

## 2020-12-12 ENCOUNTER — Telehealth: Payer: Self-pay | Admitting: *Deleted

## 2020-12-12 NOTE — Telephone Encounter (Signed)
-----   Message from Alycia Rossetti, MD sent at 12/09/2020  9:00 AM EST ----- Regarding: Send Cologuard

## 2020-12-12 NOTE — Telephone Encounter (Signed)
Received verbal orders for Cologuard.   Order placed via [facility].   Cologuard (Order [id number])

## 2020-12-13 ENCOUNTER — Encounter: Payer: Self-pay | Admitting: *Deleted

## 2020-12-13 ENCOUNTER — Telehealth: Payer: Self-pay | Admitting: *Deleted

## 2020-12-13 NOTE — Telephone Encounter (Signed)
Received request from pharmacy for PA on Diclofenac Gel.   PA submitted.   Dx: M51.36- DDD  Your information has been submitted to [place] Medicare Part D. [employer] will review the request and will issue a decision, typically within 1-3 days from your submission. You can check the updated outcome later by reopening this request.  If [employer] has not responded in 1-3 days or if you have any questions about your ePA request, please contact [place] Medicare Part D at [phone]. If you think there may be a problem with your PA request, use our live chat feature at the bottom right.

## 2020-12-13 NOTE — Telephone Encounter (Signed)
This encounter was created in error - please disregard.

## 2020-12-15 ENCOUNTER — Other Ambulatory Visit: Payer: Self-pay | Admitting: *Deleted

## 2020-12-15 MED ORDER — SIMVASTATIN 80 MG PO TABS: 80.0000 mg | ORAL_TABLET | Freq: Every day | ORAL | 3 refills | Status: DC

## 2020-12-15 NOTE — Telephone Encounter (Signed)
Received PA determination.   PA approved 10/15/2020- 12/13/2021.

## 2020-12-16 ENCOUNTER — Other Ambulatory Visit: Payer: Self-pay | Admitting: *Deleted

## 2020-12-16 DIAGNOSIS — E039 Hypothyroidism, unspecified: Secondary | ICD-10-CM

## 2021-01-04 ENCOUNTER — Encounter: Payer: Self-pay | Admitting: Nurse Practitioner

## 2021-01-04 ENCOUNTER — Ambulatory Visit (INDEPENDENT_AMBULATORY_CARE_PROVIDER_SITE_OTHER): Payer: Medicare Other | Admitting: Nurse Practitioner

## 2021-01-04 ENCOUNTER — Other Ambulatory Visit: Payer: Self-pay

## 2021-01-04 VITALS — BP 126/68 | HR 59 | Temp 97.5°F | Wt 115.2 lb

## 2021-01-04 DIAGNOSIS — R252 Cramp and spasm: Secondary | ICD-10-CM | POA: Insufficient documentation

## 2021-01-04 DIAGNOSIS — E039 Hypothyroidism, unspecified: Secondary | ICD-10-CM

## 2021-01-04 DIAGNOSIS — D539 Nutritional anemia, unspecified: Secondary | ICD-10-CM | POA: Diagnosis not present

## 2021-01-04 HISTORY — DX: Cramp and spasm: R25.2

## 2021-01-04 NOTE — Assessment & Plan Note (Addendum)
Acute. Unclear etiology, differentials include myalgia related to recent increase in statin, neuropathy related to ETOH abuse, low magnesium 2/2 chronic PPI use, radiculopathy 2/2 ddd of lumbar spine.  Will check CK today to evaluate for statin-induced myopathy, vitamin B12, folate, and magnesium.  Encouraged increasing water intake to at least 3 bottles of water daily and cut back on drinking alcohol.

## 2021-01-04 NOTE — Assessment & Plan Note (Signed)
Noted during review of previous labs.  Question folate vs. Vitamin b12 deficiency, especially give alcoholism.  Will check folate and vitamin b12 and supplement as indicated.

## 2021-01-04 NOTE — Assessment & Plan Note (Signed)
Chronic.  TSH elevated near end of February, patient admits was not taking levothyroxine consistently at that time.  Is taking consistently now, will recheck TSH with panel and adjust medication as needed.  Continue levothyroxine 75 mcg daily for now.  Follow up 6 months.

## 2021-01-04 NOTE — Progress Notes (Signed)
Subjective:    Patient ID: Lee Wilson, male    DOB: [birth year], 75 y.o.   MRN: [id number]  HPI: Lee Wilson is a 75 y.o. male presenting for leg pain.  Chief Complaint  Patient presents with  . Leg Pain   LEG CRAMPS Went to [facility] yesterday and was having pain in his right leg while walking.  It was severe enough that he had to sit in his car until it passed.  No recent injury, however reports he fell walking dogs on concrete  4-5 months ago, denies injury at that time.  Reports pain that shoots down both of his legs while laying in bed, does not know if that could be related. Duration: days Pain: yes Severity: severe  Quality:  Sharp, cramping Location:  Back of calf and down to foot Bilateral:  no Onset: sudden Frequency: intermittent  Time of day: at random Sudden unintentional leg jerking:   no Paresthesias:   no Decreased sensation:  no Weakness:   no Insomnia:   no Fatigue:   no Alleviating factors: nothing tried; rest Aggravating factors: activity Status: better  Treatments attempted: nothing tried except rest  HYPOTHYROIDISM Thyroid control status:uncontrolled; recent started taking medication faithfully again Satisfied with current treatment? yes Medication side effects: no Medication compliance: excellent compliance Recent dose adjustment:no Fatigue: no Cold intolerance: no Heat intolerance: no Weight gain: no Weight loss: no Constipation: no Diarrhea/loose stools: no Palpitations: no Lower extremity edema: no Anxiety/depressed mood: no  HYPERLIPIDEMIA Hyperlipidemia status: uncontrolled, recently increased simvastatin Satisfied with current treatment?  yes Side effects:  no Medication compliance: excellent compliance Past cholesterol meds: no Aspirin:  no The 10-year ASCVD risk score Mikey Bussing DC Jr., et al., 2013) is: 18%   Values used to calculate the score:     Age: 86 years     Sex: Male     Is Non-Hispanic African American: No      Diabetic: No     Tobacco smoker: No     Systolic Blood Pressure: 267 mmHg     Is BP treated: No     HDL Cholesterol: 79 mg/dL     Total Cholesterol: 234 mg/dL Chest pain:  no Coronary artery disease:  yes  Allergies  Allergen Reactions  . Penicillin G Other (See Comments)    Outpatient Encounter Medications as of 01/04/2021  Medication Sig  . albuterol (VENTOLIN HFA) 108 (90 Base) MCG/ACT inhaler Inhale 2 puffs into the lungs every 4 (four) hours as needed for wheezing or shortness of breath.  . cetirizine (ZYRTEC) 10 MG tablet Take 1 tablet (10 mg total) by mouth daily.  . diclofenac Sodium (VOLTAREN) 1 % GEL Apply to back three times as needed for pain  . hydrocortisone 2.5 % cream Apply topically 2 (two) times daily. Apply to face (Patient not taking: Reported on 12/09/2020)  . levothyroxine (SYNTHROID) 75 MCG tablet TAKE 1 TABLET(75 MCG) BY MOUTH DAILY BEFORE BREAKFAST  . pantoprazole (PROTONIX) 40 MG tablet Take 1 tablet (40 mg total) by mouth daily.  . simvastatin (ZOCOR) 80 MG tablet Take 1 tablet (80 mg total) by mouth at bedtime.  . Tiotropium Bromide Monohydrate (SPIRIVA RESPIMAT) 2.5 MCG/ACT AERS Inhale 2 puffs into the lungs daily.   No facility-administered encounter medications on file as of 01/04/2021.    Patient Active Problem List   Diagnosis Date Noted  . Macrocytic anemia 01/04/2021  . Leg cramp 01/04/2021  . DDD (degenerative disc disease), lumbar 12/09/2020  .  Hypothyroidism 12/01/2018  . Alcohol use 12/01/2018  . Protein-calorie malnutrition ([place]) 03/25/2018  . Carotid artery disease ([place]) 07/25/2016  . Cerebrovascular disease or lesion 07/25/2016  . Lytic bone lesions on xray 07/25/2016  . C6 cervical fracture ([place]) 07/16/2016  . C7 cervical fracture ([place]) 07/16/2016  . Fall from height of greater than 3 feet 07/16/2016  . Spleen hematoma 07/16/2016  . T1 vertebral fracture ([place]) 07/16/2016  . Chronic bronchitis ([place]) 09/22/2013  . GERD  (gastroesophageal reflux disease) 09/22/2013    Past Medical History:  Diagnosis Date  . Asthma   . GERD (gastroesophageal reflux disease)     Relevant past medical, surgical, family and social history reviewed and updated as indicated. Interim medical history since our last visit reviewed.  Review of Systems Per HPI unless specifically indicated above     Objective:    BP 126/68 (BP Location: Left Arm, Patient Position: Sitting)   Pulse (!) 59   Temp (!) 97.5 F (36.4 C) (Oral)   Wt 115 lb 3.2 oz (52.3 kg)   SpO2 96%   BMI 19.77 kg/m   Wt Readings from Last 3 Encounters:  01/04/21 115 lb 3.2 oz (52.3 kg)  12/09/20 115 lb (52.2 kg)  06/08/20 115 lb (52.2 kg)    Physical Exam Vitals reviewed.  Constitutional:      General: He is not in acute distress.    Appearance: Normal appearance. He is not toxic-appearing.  HENT:     Head: Normocephalic and atraumatic.  Eyes:     General: No scleral icterus.    Extraocular Movements: Extraocular movements intact.  Cardiovascular:     Rate and Rhythm: Normal rate and regular rhythm.  Pulmonary:     Effort: Pulmonary effort is normal. No respiratory distress.     Breath sounds: Normal breath sounds. No wheezing, rhonchi or rales.  Musculoskeletal:        General: No swelling, tenderness or signs of injury. Normal range of motion.     Right lower leg: No swelling, deformity, tenderness or bony tenderness. No edema.     Left lower leg: No swelling, deformity, tenderness or bony tenderness. No edema.     Right ankle: Normal. Normal pulse.     Left ankle: Normal. Normal pulse.     Right foot: Normal. Normal pulse.     Left foot: Normal. Normal pulse.  Skin:    General: Skin is warm and dry.     Capillary Refill: Capillary refill takes less than 2 seconds.     Coloration: Skin is not jaundiced or pale.     Findings: No erythema.  Neurological:     Mental Status: He is alert and oriented to person, place, and time.     Motor:  No weakness.     Gait: Gait normal.  Psychiatric:        Mood and Affect: Mood normal.        Behavior: Behavior normal.        Thought Content: Thought content normal.        Judgment: Judgment normal.    Results for orders placed or performed in visit on 12/09/20  CBC with Differential/Platelet  Result Value Ref Range   WBC 4.9 3.8 - 10.8 Thousand/uL   RBC 4.11 (L) 4.20 - 5.80 Million/uL   Hemoglobin 14.0 13.2 - 17.1 g/dL   HCT 41.2 38.5 - 50.0 %   MCV 100.2 (H) 80.0 - 100.0 fL   MCH 34.1 (H) 27.0 - 33.0  pg   MCHC 34.0 32.0 - 36.0 g/dL   RDW 12.7 11.0 - 15.0 %   Platelets 186 140 - 400 Thousand/uL   MPV 9.6 7.5 - 12.5 fL   Neutro Abs 2,362 1,500 - 7,800 cells/uL   Lymphs Abs 1,833 850 - 3,900 cells/uL   Absolute Monocytes 539 200 - 950 cells/uL   Eosinophils Absolute 127 15 - 500 cells/uL   Basophils Absolute 39 0 - 200 cells/uL   Neutrophils Relative % 48.2 %   Total Lymphocyte 37.4 %   Monocytes Relative 11.0 %   Eosinophils Relative 2.6 %   Basophils Relative 0.8 %  Comprehensive metabolic panel  Result Value Ref Range   Glucose, Bld 85 65 - 99 mg/dL   BUN 10 7 - 25 mg/dL   Creat 0.92 0.70 - 1.18 mg/dL   BUN/Creatinine Ratio NOT APPLICABLE 6 - 22 (calc)   Sodium 135 135 - 146 mmol/L   Potassium 4.6 3.5 - 5.3 mmol/L   Chloride 100 98 - 110 mmol/L   CO2 25 20 - 32 mmol/L   Calcium 9.5 8.6 - 10.3 mg/dL   Total Protein 7.0 6.1 - 8.1 g/dL   Albumin 4.1 3.6 - 5.1 g/dL   Globulin 2.9 1.9 - 3.7 g/dL (calc)   AG Ratio 1.4 1.0 - 2.5 (calc)   Total Bilirubin 0.5 0.2 - 1.2 mg/dL   Alkaline phosphatase (APISO) 48 35 - 144 U/L   AST 17 10 - 35 U/L   ALT 8 (L) 9 - 46 U/L  Lipid panel  Result Value Ref Range   Cholesterol 234 (H) <200 mg/dL   HDL 79 > OR = 40 mg/dL   Triglycerides 84 <150 mg/dL   LDL Cholesterol (Calc) 137 (H) mg/dL (calc)   Total CHOL/HDL Ratio 3.0 <5.0 (calc)   Non-HDL Cholesterol (Calc) 155 (H) <130 mg/dL (calc)  TSH  Result Value Ref Range   TSH  10.12 (H) 0.40 - 4.50 mIU/L  PSA  Result Value Ref Range   PSA 0.33 < OR = 4.0 ng/mL      Assessment & Plan:   Problem List Items Addressed This Visit      Endocrine   Hypothyroidism    Chronic.  TSH elevated near end of February, patient admits was not taking levothyroxine consistently at that time.  Is taking consistently now, will recheck TSH with panel and adjust medication as needed.  Continue levothyroxine 75 mcg daily for now.  Follow up 6 months.      Relevant Orders   Thyroid Panel With TSH     Other   Leg cramp - Primary    Acute. Unclear etiology, differentials include myalgia related to recent increase in statin, neuropathy related to ETOH abuse, low magnesium 2/2 chronic PPI use, radiculopathy 2/2 ddd of lumbar spine.  Will check CK today to evaluate for statin-induced myopathy, vitamin B12, folate, and magnesium.  Encouraged increasing water intake to at least 3 bottles of water daily and cut back on drinking alcohol.      Relevant Orders   B12 and Folate Panel   Magnesium   CK   Macrocytic anemia    Noted during review of previous labs.  Question folate vs. Vitamin b12 deficiency, especially give alcoholism.  Will check folate and vitamin b12 and supplement as indicated.       Relevant Orders   B12 and Folate Panel       Follow up plan: Return for as scheduled.

## 2021-01-05 LAB — B12 AND FOLATE PANEL
Folate: 7.2 ng/mL
Vitamin B-12: 217 pg/mL (ref 200–1100)

## 2021-01-05 LAB — THYROID PANEL WITH TSH
Free Thyroxine Index: 1.5 (ref 1.4–3.8)
T3 Uptake: 34 % (ref 22–35)
T4, Total: 4.3 ug/dL — ABNORMAL LOW (ref 4.9–10.5)
TSH: 11.57 mIU/L — ABNORMAL HIGH (ref 0.40–4.50)

## 2021-01-05 LAB — MAGNESIUM: Magnesium: 2.2 mg/dL (ref 1.5–2.5)

## 2021-01-05 LAB — CK: Total CK: 103 U/L (ref 44–196)

## 2021-01-11 ENCOUNTER — Encounter: Payer: Self-pay | Admitting: *Deleted

## 2021-02-16 ENCOUNTER — Ambulatory Visit (INDEPENDENT_AMBULATORY_CARE_PROVIDER_SITE_OTHER): Payer: Medicare Other | Admitting: Nurse Practitioner

## 2021-02-16 ENCOUNTER — Other Ambulatory Visit: Payer: Self-pay

## 2021-02-16 ENCOUNTER — Encounter: Payer: Self-pay | Admitting: Nurse Practitioner

## 2021-02-16 VITALS — BP 108/60 | HR 60 | Temp 97.9°F | Ht 64.0 in | Wt 112.4 lb

## 2021-02-16 DIAGNOSIS — E039 Hypothyroidism, unspecified: Secondary | ICD-10-CM | POA: Diagnosis not present

## 2021-02-16 DIAGNOSIS — R21 Rash and other nonspecific skin eruption: Secondary | ICD-10-CM | POA: Diagnosis not present

## 2021-02-16 MED ORDER — LEVOTHYROXINE SODIUM 88 MCG PO TABS: 88.0000 ug | ORAL_TABLET | Freq: Every day | ORAL | 0 refills | Status: DC

## 2021-02-16 MED ORDER — PREDNISONE 10 MG PO TABS: ORAL_TABLET | ORAL | 0 refills | Status: DC

## 2021-02-16 MED ORDER — HYDROCORTISONE 2.5 % EX CREA: TOPICAL_CREAM | Freq: Two times a day (BID) | CUTANEOUS | 2 refills | Status: DC

## 2021-02-16 NOTE — Progress Notes (Signed)
Subjective:    Patient ID: Lee Wilson, male    DOB: [birth year], 75 y.o.   MRN: [id number]  HPI: DEMONTAY Wilson is a 75 y.o. male presenting for rash.  Chief Complaint  Patient presents with  . Pruritis    Exposed to poion ivy, itching all over, not using topical treatment   RASH Patient reports he has been working in his yard as well as other people's yard cutting limbs, cleaning up weeds and has noticed a rash all over his body.  It is on his feet, legs, and arms Duration:  weeks  Location: feet, legs, arms Itching: yes Burning: no Redness: yes Oozing: no Scaling: no Blisters: no Painful: no Fevers: no Change in detergents/soaps/personal care products: no Recent illness: no Recent travel:no History of same: yes Context: stable Alleviating factors: nothing tried Treatments attempted:nothing tried Shortness of breath: no  Throat/tongue swelling: no Myalgias/arthralgias: no  HYPOTHYROIDISM TSH high at last visit; free T4 borderline low.  Patient reports taking levothyroxine 75 mcg daily on an empty stomach.  Thyroid control status:uncontrolled Satisfied with current treatment? yes Medication side effects: no Medication compliance: excellent compliance Recent dose adjustment:no Fatigue: no Cold intolerance: no Heat intolerance: no Weight gain: no Weight loss: no Constipation: no Diarrhea/loose stools: no Palpitations: no Lower extremity edema: no Anxiety/depressed mood: no     Allergies  Allergen Reactions  . Penicillin G Other (See Comments)    Outpatient Encounter Medications as of 02/16/2021  Medication Sig  . albuterol (VENTOLIN HFA) 108 (90 Base) MCG/ACT inhaler Inhale 2 puffs into the lungs every 4 (four) hours as needed for wheezing or shortness of breath.  . cetirizine (ZYRTEC) 10 MG tablet Take 1 tablet (10 mg total) by mouth daily.  . diclofenac Sodium (VOLTAREN) 1 % GEL Apply to back three times as needed for pain  . levothyroxine  (SYNTHROID) 88 MCG tablet Take 1 tablet (88 mcg total) by mouth daily before breakfast.  . pantoprazole (PROTONIX) 40 MG tablet Take 1 tablet (40 mg total) by mouth daily.  . predniSONE (DELTASONE) 10 MG tablet Take 40mg  on days 1-2. Take 30mg  on days 3-4. Take 20mg  on days 5-6. Take 10mg  on days 7-8. Take 5mg  on days 9-10, then stop.  . simvastatin (ZOCOR) 80 MG tablet Take 1 tablet (80 mg total) by mouth at bedtime.  . Tiotropium Bromide Monohydrate (SPIRIVA RESPIMAT) 2.5 MCG/ACT AERS Inhale 2 puffs into the lungs daily.  . [DISCONTINUED] hydrocortisone 2.5 % cream Apply topically 2 (two) times daily. Apply to face  . [DISCONTINUED] levothyroxine (SYNTHROID) 75 MCG tablet TAKE 1 TABLET(75 MCG) BY MOUTH DAILY BEFORE BREAKFAST  . hydrocortisone 2.5 % cream Apply topically 2 (two) times daily. Apply to face   No facility-administered encounter medications on file as of 02/16/2021.    Patient Active Problem List   Diagnosis Date Noted  . Rash 02/16/2021  . Macrocytic anemia 01/04/2021  . DDD (degenerative disc disease), lumbar 12/09/2020  . Hypothyroidism 12/01/2018  . Alcohol use 12/01/2018  . Protein-calorie malnutrition ([place]) 03/25/2018  . Carotid artery disease ([place]) 07/25/2016  . Cerebrovascular disease or lesion 07/25/2016  . Lytic bone lesions on xray 07/25/2016  . C6 cervical fracture ([place]) 07/16/2016  . C7 cervical fracture ([place]) 07/16/2016  . Spleen hematoma 07/16/2016  . T1 vertebral fracture ([place]) 07/16/2016  . Chronic bronchitis ([place]) 09/22/2013  . GERD (gastroesophageal reflux disease) 09/22/2013    Past Medical History:  Diagnosis Date  . Asthma   .  Fall from height of greater than 3 feet 07/16/2016  . GERD (gastroesophageal reflux disease)   . Leg cramp 01/04/2021    Relevant past medical, surgical, family and social history reviewed and updated as indicated. Interim medical history since our last visit reviewed.  Review of Systems Per HPI unless specifically  indicated above     Objective:    BP 108/60   Pulse 60   Temp 97.9 F (36.6 C)   Ht 5\' 4"  (1.626 m)   Wt 112 lb 6.4 oz (51 kg)   SpO2 98%   BMI 19.29 kg/m   Wt Readings from Last 3 Encounters:  02/16/21 112 lb 6.4 oz (51 kg)  01/04/21 115 lb 3.2 oz (52.3 kg)  12/09/20 115 lb (52.2 kg)    Physical Exam Vitals reviewed.  Constitutional:      General: He is not in acute distress.    Appearance: Normal appearance. He is not toxic-appearing.  HENT:     Head: Normocephalic and atraumatic.  Cardiovascular:     Rate and Rhythm: Normal rate and regular rhythm.     Heart sounds: Normal heart sounds. No murmur heard.   Pulmonary:     Effort: Pulmonary effort is normal. No respiratory distress.     Breath sounds: Normal breath sounds. No wheezing, rhonchi or rales.  Musculoskeletal:        General: Normal range of motion.     Cervical back: Normal range of motion.     Right lower leg: No edema.     Left lower leg: No edema.  Lymphadenopathy:     Cervical: No cervical adenopathy.  Skin:    General: Skin is warm and dry.     Capillary Refill: Capillary refill takes less than 2 seconds.     Findings: Abrasion and rash present. Rash is macular, papular and urticarial. Rash is not crusting or scaling.     Comments: Pink papular lesions noted to bilateral lower extremities including tops of feet, and arms.  Some lesions noted to be scabbed over due to excoriation.  No oozing, drainage, or tenderness to palpation.  Neurological:     Mental Status: He is alert and oriented to person, place, and time.  Psychiatric:        Mood and Affect: Mood normal.        Behavior: Behavior normal.        Thought Content: Thought content normal.        Judgment: Judgment normal.     Results for orders placed or performed in visit on 01/04/21  Thyroid Panel With TSH  Result Value Ref Range   T3 Uptake 34 22 - 35 %   T4, Total 4.3 (L) 4.9 - 10.5 mcg/dL   Free Thyroxine Index 1.5 1.4 - 3.8    TSH 11.57 (H) 0.40 - 4.50 mIU/L  B12 and Folate Panel  Result Value Ref Range   Vitamin B-12 217 200 - 1,100 pg/mL   Folate 7.2 ng/mL  Magnesium  Result Value Ref Range   Magnesium 2.2 1.5 - 2.5 mg/dL  CK  Result Value Ref Range   Total CK 103 44 - 196 U/L      Assessment & Plan:   Problem List Items Addressed This Visit      Endocrine   Hypothyroidism - Primary    Chronic.  Last TSH at the end of March elevated, free T4 borderline low.  Will increase levothyroxine to 88 mcg daily on an empty stomach  and follow-up in 6 weeks.      Relevant Medications   levothyroxine (SYNTHROID) 88 MCG tablet     Musculoskeletal and Integument   Rash    Unclear etiology, may be due to plant dermatitis, however not typical presentation.  Will refill hydrocortisone cream to use as needed for itchy areas and to prevent excoriation and scabbing.  We will also treat with oral prednisone given rash is on both lower and upper extremities.  Follow-up if rash does not improve.      Relevant Medications   hydrocortisone 2.5 % cream   predniSONE (DELTASONE) 10 MG tablet       Follow up plan: Return in about 6 weeks (around 03/30/2021) for TSH recheck.

## 2021-02-16 NOTE — Patient Instructions (Signed)
Poison Oak Dermatitis  Poison oak dermatitis is redness and soreness (inflammation) of the skin caused by chemicals in the leaves of the poison oak plant. You may have very bad itching, swelling, a rash, and blisters. What are the causes? You may get this condition by:  Touching a poison oak plant.  Touching something that has the chemical from the leaves on it. This may include animals or objects that have come in contact with the plant. What increases the risk? You are more likely to get this condition if you:  Go outdoors often in wooded or marshy areas.  Go outdoors without wearing protective clothing, such as closed shoes, long pants, and a long-sleeved shirt. What are the signs or symptoms? Symptoms of this condition include:  Redness of the skin.  Very bad itching.  A rash that often includes bumps and blisters. ? The rash usually appears 48 hours after exposure if you have been exposed before. ? If this is the first time you have been exposed, the rash may not appear until a week after exposure.  Swelling. This may occur if the reaction is very bad. Symptoms often clear up in 1-2 weeks. The first time you develop this condition, symptoms may last 3-4 weeks. How is this treated? This condition may be treated with:  Hydrocortisone creams or calamine lotions to help with itching.  Oatmeal baths to soothe the skin.  Medicines to help reduce itching (antihistamines). If you have a very bad reaction, you may also be given steroid medicines. Follow these instructions at home: Medicines  Take or apply over-the-counter and prescription medicines only as told by your doctor.  Use hydrocortisone creams or calamine lotion as needed to help with itching. General instructions  Do not scratch or rub your skin.  Put a cold, wet cloth (cold compress) on the affected areas or take baths in cool water. This will help with itching.  Avoid hot baths and showers.  Take oatmeal  baths as needed. Use colloidal oatmeal. You can get this at a pharmacy or grocery store. Follow the instructions on the package.  While you have the rash, wash your clothes right after you wear them.  Keep all follow-up visits as told by your doctor. This is important. How is this prevented?  Know what poison oak looks like so you can avoid it. ? This plant has three leaves with flowering branches on a single stem. ? The leaves are fuzzy. ? The edges of the leaves look like teeth.  If you have touched poison oak, wash your skin with soap and water right away. Be sure to wash under your fingernails.  When hiking or camping, wear long pants, a long-sleeved shirt, tall socks, and hiking boots. You can also use a lotion on your skin that helps to prevent contact with the chemical on the plant.  If you think that your clothes or outdoor gear came in contact with poison oak, rinse them off with a garden hose before you bring them inside your house.  When doing yard work or gardening, wear gloves, long sleeves, long pants, and boots. Wash your garden tools and gloves if they come in contact with poison oak.  If you think that your pet has come into contact with poison oak, wash him or her with pet shampoo and water. Make sure you wear gloves while washing your pet.  Do not burn poison oak plants. This can release the chemical from the plant into the air and may  cause a reaction.   Contact a doctor if:  You have open sores in the rash area.  You have more redness, swelling, or pain in the affected area.  You have redness that spreads beyond the rash area.  You have fluid, blood, or pus coming from the affected area.  You have a fever.  You have a rash over a large area of your body.  You have a rash on your eyes, mouth, or genitals.  Your rash does not improve after a few weeks. Get help right away if:  Your face swells or your eyes swell shut.  You have trouble breathing.  You  have trouble swallowing. These symptoms may be an emergency. Do not wait to see if the symptoms will go away. Get medical help right away. Call your local emergency services (911 in the [place] Do not drive yourself to the hospital. Summary  Poison oak dermatitis is redness and soreness of the skin caused by chemicals in the leaves of the poison oak plant.  Symptoms of this condition include redness, very bad itching, a rash, and swelling.  Do not scratch or rub your skin.  Take or apply over-the-counter and prescription medicines only as told by your doctor. This information is not intended to replace advice given to you by your health care provider. Make sure you discuss any questions you have with your health care provider. Document Revised: 01/23/2019 Document Reviewed: 10/31/2018 Elsevier Patient Education  2021 [employer].

## 2021-02-16 NOTE — Assessment & Plan Note (Signed)
Chronic.  Last TSH at the end of March elevated, free T4 borderline low.  Will increase levothyroxine to 88 mcg daily on an empty stomach and follow-up in 6 weeks.

## 2021-02-16 NOTE — Assessment & Plan Note (Signed)
Unclear etiology, may be due to plant dermatitis, however not typical presentation.  Will refill hydrocortisone cream to use as needed for itchy areas and to prevent excoriation and scabbing.  We will also treat with oral prednisone given rash is on both lower and upper extremities.  Follow-up if rash does not improve.

## 2021-02-24 ENCOUNTER — Other Ambulatory Visit: Payer: Self-pay | Admitting: *Deleted

## 2021-02-24 MED ORDER — SIMVASTATIN 80 MG PO TABS: 80.0000 mg | ORAL_TABLET | Freq: Every day | ORAL | 3 refills | Status: DC

## 2021-03-30 ENCOUNTER — Ambulatory Visit: Payer: Medicare Other | Admitting: Nurse Practitioner

## 2021-03-30 NOTE — Progress Notes (Deleted)
Subjective:    Patient ID: Lee Wilson, male    DOB: 1945/10/29, 75 y.o.   MRN: [id number]  HPI: Lee Wilson is a 75 y.o. male presenting for  No chief complaint on file.  HYPOTHYROIDISM Thyroid control status:{Blank single:19197::"controlled","uncontrolled","better","worse","exacerbated","stable"} Satisfied with current treatment? {Blank single:19197::"yes","no"} Medication side effects: {Blank single:19197::"yes","no"} Medication compliance: {Blank single:19197::"excellent compliance","good compliance","fair compliance","poor compliance"} Etiology of hypothyroidism:  Recent dose adjustment:{Blank single:19197::"yes","no"} Fatigue: {Blank single:19197::"yes","no"} Cold intolerance: {Blank single:19197::"yes","no"} Heat intolerance: {Blank single:19197::"yes","no"} Weight gain: {Blank single:19197::"yes","no"} Weight loss: {Blank single:19197::"yes","no"} Constipation: {Blank single:19197::"yes","no"} Diarrhea/loose stools: {Blank single:19197::"yes","no"} Palpitations: {Blank single:19197::"yes","no"} Lower extremity edema: {Blank single:19197::"yes","no"} Anxiety/depressed mood: {Blank single:19197::"yes","no"}  HYPERLIPIDEMIA Hyperlipidemia status: {Blank single:19197::"excellent compliance","good compliance","fair compliance","poor compliance"} Satisfied with current treatment?  {Blank single:19197::"yes","no"} Side effects:  {Blank single:19197::"yes","no"} Medication compliance: {Blank single:19197::"excellent compliance","good compliance","fair compliance","poor compliance"} Past cholesterol meds:  Aspirin:  {Blank single:19197::"yes","no"} The 10-year ASCVD risk score Mikey Bussing DC Jr., et al., 2013) is: 14%   Values used to calculate the score:     Age: 42 years     Sex: Male     Is Non-Hispanic African American: No     Diabetic: No     Tobacco smoker: No     Systolic Blood Pressure: 893 mmHg     Is BP treated: No     HDL Cholesterol: 79 mg/dL     Total  Cholesterol: 234 mg/dL Chest pain:  {Blank single:19197::"yes","no"} Coronary artery disease:  {Blank single:19197::"yes","no"} Family history CAD:  {Blank single:19197::"yes","no"} Family history early CAD:  {Blank single:19197::"yes","no"}   Allergies  Allergen Reactions   Penicillin G Other (See Comments)    Outpatient Encounter Medications as of 03/30/2021  Medication Sig   albuterol (VENTOLIN HFA) 108 (90 Base) MCG/ACT inhaler Inhale 2 puffs into the lungs every 4 (four) hours as needed for wheezing or shortness of breath.   cetirizine (ZYRTEC) 10 MG tablet Take 1 tablet (10 mg total) by mouth daily.   diclofenac Sodium (VOLTAREN) 1 % GEL Apply to back three times as needed for pain   hydrocortisone 2.5 % cream Apply topically 2 (two) times daily. Apply to face   levothyroxine (SYNTHROID) 88 MCG tablet Take 1 tablet (88 mcg total) by mouth daily before breakfast.   pantoprazole (PROTONIX) 40 MG tablet Take 1 tablet (40 mg total) by mouth daily.   predniSONE (DELTASONE) 10 MG tablet Take 40mg  on days 1-2. Take 30mg  on days 3-4. Take 20mg  on days 5-6. Take 10mg  on days 7-8. Take 5mg  on days 9-10, then stop.   simvastatin (ZOCOR) 80 MG tablet Take 1 tablet (80 mg total) by mouth at bedtime.   Tiotropium Bromide Monohydrate (SPIRIVA RESPIMAT) 2.5 MCG/ACT AERS Inhale 2 puffs into the lungs daily.   No facility-administered encounter medications on file as of 03/30/2021.    Patient Active Problem List   Diagnosis Date Noted   Rash 02/16/2021   Macrocytic anemia 01/04/2021   DDD (degenerative disc disease), lumbar 12/09/2020   Hypothyroidism 12/01/2018   Alcohol use 12/01/2018   Protein-calorie malnutrition ([place]) 03/25/2018   Carotid artery disease ([place]) 07/25/2016   Cerebrovascular disease or lesion 07/25/2016   Lytic bone lesions on xray 07/25/2016   C6 cervical fracture ([place]) 07/16/2016   C7 cervical fracture ([place]) 07/16/2016   Spleen hematoma 07/16/2016   T1 vertebral  fracture ([place]) 07/16/2016   Chronic bronchitis ([place]) 09/22/2013   GERD (gastroesophageal reflux disease) 09/22/2013    Past Medical History:  Diagnosis Date   Asthma    Fall  from height of greater than 3 feet 07/16/2016   GERD (gastroesophageal reflux disease)    Leg cramp 01/04/2021    Relevant past medical, surgical, family and social history reviewed and updated as indicated. Interim medical history since our last visit reviewed.  Review of Systems  Per HPI unless specifically indicated above     Objective:    There were no vitals taken for this visit.  Wt Readings from Last 3 Encounters:  02/16/21 112 lb 6.4 oz (51 kg)  01/04/21 115 lb 3.2 oz (52.3 kg)  12/09/20 115 lb (52.2 kg)    Physical Exam  Results for orders placed or performed in visit on 01/04/21  Thyroid Panel With TSH  Result Value Ref Range   T3 Uptake 34 22 - 35 %   T4, Total 4.3 (L) 4.9 - 10.5 mcg/dL   Free Thyroxine Index 1.5 1.4 - 3.8   TSH 11.57 (H) 0.40 - 4.50 mIU/L  B12 and Folate Panel  Result Value Ref Range   Vitamin B-12 217 200 - 1,100 pg/mL   Folate 7.2 ng/mL  Magnesium  Result Value Ref Range   Magnesium 2.2 1.5 - 2.5 mg/dL  CK  Result Value Ref Range   Total CK 103 44 - 196 U/L      Assessment & Plan:   Problem List Items Addressed This Visit   None    Follow up plan: No follow-ups on file.

## 2021-04-12 ENCOUNTER — Encounter: Payer: Self-pay | Admitting: Neurology

## 2021-04-20 ENCOUNTER — Telehealth: Payer: Self-pay

## 2021-04-20 ENCOUNTER — Ambulatory Visit (INDEPENDENT_AMBULATORY_CARE_PROVIDER_SITE_OTHER): Payer: Medicare Other | Admitting: Nurse Practitioner

## 2021-04-20 ENCOUNTER — Other Ambulatory Visit: Payer: Self-pay

## 2021-04-20 VITALS — BP 110/84 | HR 85 | Ht 64.0 in | Wt 106.8 lb

## 2021-04-20 DIAGNOSIS — K920 Hematemesis: Secondary | ICD-10-CM

## 2021-04-20 DIAGNOSIS — R1084 Generalized abdominal pain: Secondary | ICD-10-CM | POA: Diagnosis not present

## 2021-04-20 DIAGNOSIS — K2100 Gastro-esophageal reflux disease with esophagitis, without bleeding: Secondary | ICD-10-CM | POA: Diagnosis not present

## 2021-04-20 DIAGNOSIS — R634 Abnormal weight loss: Secondary | ICD-10-CM | POA: Diagnosis not present

## 2021-04-20 MED ORDER — SUCRALFATE 1 GM/10ML PO SUSP: 1.0000 g | Freq: Three times a day (TID) | ORAL | 0 refills | Status: DC

## 2021-04-20 MED ORDER — SUCRALFATE 1 G PO TABS: 1.0000 g | ORAL_TABLET | Freq: Three times a day (TID) | ORAL | 0 refills | Status: DC

## 2021-04-20 NOTE — Progress Notes (Signed)
Subjective:    Patient ID: Lee Wilson, male    DOB: August 24, 1946, 75 y.o.   MRN: [id number]  HPI: Lee Wilson is a 75 y.o. male presenting for acid reflux  Chief Complaint  Patient presents with   GI Problem    Protonix is not helping with stomach acid, causing vomiting with acid reflux.   Medication Refill    Spiriva and albuterol   GERD Patient reports he is gagging, throwing up a bunch, has not been able to keep anything down for 2 weeks.  Went to the ED looks like he was given Pepcid and imaging was done.  Of note, he does drink alcohol daily.  He has been taking Protonix 40 mg daily without any benefit. GERD control status: uncontrolled Satisfied with current treatment? no Heartburn frequency: daily multiple times daily Medication side effects: no  Medication compliance: excellent Dysphagia: yes Odynophagia:  yes Hematemesis: yes; 1 or 2 times, none recent Blood in stool: no EGD: yes; reports years ago  Reports breathing is stable.  Requesting refills of Spiriva and rescue inhaler today.  Allergies  Allergen Reactions   Penicillin G Other (See Comments)    Outpatient Encounter Medications as of 04/20/2021  Medication Sig   albuterol (VENTOLIN HFA) 108 (90 Base) MCG/ACT inhaler Inhale 2 puffs into the lungs every 4 (four) hours as needed for wheezing or shortness of breath.   cetirizine (ZYRTEC) 10 MG tablet Take 1 tablet (10 mg total) by mouth daily.   cetirizine (ZYRTEC) 10 MG tablet Take 1 tablet by mouth every morning.   diclofenac Sodium (VOLTAREN) 1 % GEL Apply to back three times as needed for pain   gatifloxacin (ZYMAXID) 0.5 % SOLN INSTILL 1 DROP IN RIGHT EYE FOUR TIMES DAILY AS DIRECTED   hydrocortisone 2.5 % cream Apply topically 2 (two) times daily. Apply to face   levothyroxine (SYNTHROID) 88 MCG tablet Take 1 tablet (88 mcg total) by mouth daily before breakfast.   pantoprazole (PROTONIX) 40 MG tablet Take 1 tablet (40 mg total) by mouth daily.    simvastatin (ZOCOR) 80 MG tablet Take 1 tablet (80 mg total) by mouth at bedtime.   Tiotropium Bromide Monohydrate (SPIRIVA RESPIMAT) 2.5 MCG/ACT AERS Inhale 2 puffs into the lungs daily.   traMADol (ULTRAM) 50 MG tablet Take by mouth.   [DISCONTINUED] sucralfate (CARAFATE) 1 g tablet Take 1 tablet (1 g total) by mouth 4 (four) times daily -  with meals and at bedtime.   [DISCONTINUED] sucralfate (CARAFATE) 1 GM/10ML suspension Take 10 mLs (1 g total) by mouth 4 (four) times daily -  with meals and at bedtime for 28 days.   [DISCONTINUED] predniSONE (DELTASONE) 10 MG tablet Take 40mg  on days 1-2. Take 30mg  on days 3-4. Take 20mg  on days 5-6. Take 10mg  on days 7-8. Take 5mg  on days 9-10, then stop.   No facility-administered encounter medications on file as of 04/20/2021.    Patient Active Problem List   Diagnosis Date Noted   Rash 02/16/2021   Macrocytic anemia 01/04/2021   DDD (degenerative disc disease), lumbar 12/09/2020   Hypothyroidism 12/01/2018   Alcohol use 12/01/2018   Protein-calorie malnutrition ([place]) 03/25/2018   Carotid artery disease ([place]) 07/25/2016   Cerebrovascular disease or lesion 07/25/2016   Lytic bone lesions on xray 07/25/2016   C6 cervical fracture ([place]) 07/16/2016   C7 cervical fracture ([place]) 07/16/2016   Spleen hematoma 07/16/2016   T1 vertebral fracture ([facility]) 07/16/2016   Chronic bronchitis (  [place]) 09/22/2013   GERD (gastroesophageal reflux disease) 09/22/2013    Past Medical History:  Diagnosis Date   Asthma    Fall from height of greater than 3 feet 07/16/2016   GERD (gastroesophageal reflux disease)    Leg cramp 01/04/2021    Relevant past medical, surgical, family and social history reviewed and updated as indicated. Interim medical history since our last visit reviewed.  Review of Systems Per HPI unless specifically indicated above     Objective:    BP 110/84   Pulse 85   Ht 5\' 4"  (1.626 m)   Wt 106 lb 12.8 oz (48.4 kg)   SpO2 95%   BMI  18.33 kg/m   Wt Readings from Last 3 Encounters:  04/20/21 106 lb 12.8 oz (48.4 kg)  02/16/21 112 lb 6.4 oz (51 kg)  01/04/21 115 lb 3.2 oz (52.3 kg)    Physical Exam Vitals and nursing note reviewed.  Constitutional:      General: He is not in acute distress.    Appearance: He is cachectic. He is not toxic-appearing.  HENT:     Head: Normocephalic and atraumatic.     Nose: Nose normal. No congestion or rhinorrhea.     Mouth/Throat:     Mouth: Mucous membranes are moist. Oral lesions present.     Dentition: Abnormal dentition. Dental caries present.  Eyes:     General: No scleral icterus.       Right eye: No discharge.        Left eye: No discharge.     Extraocular Movements: Extraocular movements intact.     Pupils: Pupils are equal, round, and reactive to light.  Cardiovascular:     Rate and Rhythm: Regular rhythm. Bradycardia present.  Pulmonary:     Effort: Pulmonary effort is normal. No respiratory distress.     Breath sounds: Normal breath sounds. No wheezing, rhonchi or rales.  Abdominal:     General: Bowel sounds are normal. There is no distension.     Palpations: There is no mass.     Tenderness: There is generalized abdominal tenderness.     Comments: Pain is worst near periumbilical area  Musculoskeletal:        General: Normal range of motion.     Right lower leg: No edema.     Left lower leg: No edema.  Skin:    General: Skin is warm and dry.     Coloration: Skin is not jaundiced or pale.     Findings: No erythema.  Neurological:     General: No focal deficit present.     Mental Status: He is alert and oriented to person, place, and time.     Motor: No weakness.     Gait: Gait normal.  Psychiatric:        Mood and Affect: Mood normal.        Behavior: Behavior normal.        Thought Content: Thought content normal.        Judgment: Judgment normal.      Assessment & Plan:   Problem List Items Addressed This Visit       Digestive   GERD  (gastroesophageal reflux disease) - Primary    Chronic, worsening.  Will start Carafate and continue Pepcid that was given in the ER.  Concern with hematemesis and weight loss-we will place urgent referral to GI for possible EGD -hopefully they will be able to get him in next week.  We  will also check a stat CT of his abdomen pelvis.  If symptoms worsen over the next few days, go to emergency room.  With any vomiting blood, go to ER.       Relevant Orders   Ambulatory referral to Gastroenterology   Other Visit Diagnoses     Weight loss       Relevant Orders   Ambulatory referral to Gastroenterology   CT Abdomen Pelvis Wo Contrast   Hematemesis with nausea       Relevant Orders   Ambulatory referral to Gastroenterology   CT Abdomen Pelvis Wo Contrast   Generalized abdominal pain       Relevant Orders   CT Abdomen Pelvis Wo Contrast        Follow up plan: Return in about 2 weeks (around 05/04/2021), or if symptoms worsen or fail to improve.

## 2021-04-20 NOTE — Telephone Encounter (Signed)
Pt aware of medication change, will follow up

## 2021-04-20 NOTE — Telephone Encounter (Signed)
Spoke with pt about change in medication

## 2021-04-20 NOTE — Telephone Encounter (Signed)
I switched it to tablets - these should be covered

## 2021-04-21 ENCOUNTER — Other Ambulatory Visit: Payer: Self-pay | Admitting: Nurse Practitioner

## 2021-04-21 ENCOUNTER — Encounter (INDEPENDENT_AMBULATORY_CARE_PROVIDER_SITE_OTHER): Payer: Self-pay | Admitting: *Deleted

## 2021-04-22 ENCOUNTER — Encounter: Payer: Self-pay | Admitting: Nurse Practitioner

## 2021-04-22 NOTE — Assessment & Plan Note (Addendum)
Chronic, worsening.  Will start Carafate and continue Pepcid that was given in the ER.  Concern with hematemesis and weight loss-we will place urgent referral to GI for possible EGD -hopefully they will be able to get him in next week.  We will also check a stat CT of his abdomen pelvis.  If symptoms worsen over the next few days, go to emergency room.  With any vomiting blood, go to ER.

## 2021-04-24 ENCOUNTER — Telehealth: Payer: Self-pay

## 2021-04-24 ENCOUNTER — Ambulatory Visit (HOSPITAL_COMMUNITY)
Admission: RE | Admit: 2021-04-24 | Discharge: 2021-04-24 | Disposition: A | Discharge status: Home / Self Care | Payer: Medicare Other | Source: Ambulatory Visit | Attending: Nurse Practitioner | Admitting: Nurse Practitioner

## 2021-04-24 ENCOUNTER — Other Ambulatory Visit: Payer: Self-pay

## 2021-04-24 ENCOUNTER — Encounter: Payer: Self-pay | Admitting: Neurology

## 2021-04-24 ENCOUNTER — Ambulatory Visit (INDEPENDENT_AMBULATORY_CARE_PROVIDER_SITE_OTHER): Payer: Medicare Other | Admitting: Neurology

## 2021-04-24 VITALS — BP 117/82 | HR 94 | Ht 64.0 in | Wt 104.0 lb

## 2021-04-24 DIAGNOSIS — F1010 Alcohol abuse, uncomplicated: Secondary | ICD-10-CM

## 2021-04-24 DIAGNOSIS — I6521 Occlusion and stenosis of right carotid artery: Secondary | ICD-10-CM

## 2021-04-24 DIAGNOSIS — K920 Hematemesis: Secondary | ICD-10-CM

## 2021-04-24 DIAGNOSIS — R1084 Generalized abdominal pain: Secondary | ICD-10-CM | POA: Diagnosis present

## 2021-04-24 DIAGNOSIS — M4807 Spinal stenosis, lumbosacral region: Secondary | ICD-10-CM | POA: Diagnosis not present

## 2021-04-24 DIAGNOSIS — R634 Abnormal weight loss: Secondary | ICD-10-CM | POA: Diagnosis present

## 2021-04-24 DIAGNOSIS — M48062 Spinal stenosis, lumbar region with neurogenic claudication: Secondary | ICD-10-CM

## 2021-04-24 MED ORDER — IOHEXOL 9 MG/ML PO SOLN
ORAL | Status: AC
  Filled 2021-04-24: qty 1000

## 2021-04-24 NOTE — Patient Instructions (Addendum)
MRI lumbar spine without contrast.

## 2021-04-24 NOTE — Telephone Encounter (Signed)
Called patient and discussed results - see result note.

## 2021-04-24 NOTE — Progress Notes (Signed)
[facility] Clinic Note - Initial Visit   Date: 04/24/21  CURVIN HUNGER MRN: [id number] DOB: 1946/05/26   Dear Lee Petty, FNP:  Thank you for your kind referral of [name] for consultation of right leg pain. Although his history is well known to you, please allow Lee Wilson to reiterate it for the purpose of our medical record. The patient was accompanied to the clinic by self.    History of Present Illness: Lee Wilson is a 75 y.o. left-handed male with GERD, asthma, hypothyroidism, and alcohol abuse presenting for evaluation of right leg pain and paresthesias.  Starting around March 2022, he began having tingling and pain in the right leg, starting in his buttocks and radiates down his leg into the sole of the foot.  He does not have tingling in the left leg. He has difficulty standing on the right leg, because of achy/throbbing pain behind the thigh/knee.  He is unable to walk more than 25 feet without his leg bothering him, once he rests, he is able to walk again, briefly.  He gets no relief with tylenol or NSAIDs.  He has not done PT.  He is also having a lot of nausea/vomiting and GI upset, so unable to keep any of his medications down, including vitamins.  He does report bloody emesis. He has lost about 15lb. This is being evaluate by his primary team.    He was drinking 6-pack beer nightly x 40 years, but it was not constant when he was working.  He quit drinking about a month ago, when all of his GI issues started.   Out-side paper records, electronic medical record, and images have been reviewed where available and summarized as:  MRI cervical spine 09/04/2016: 1.  Approximately 40% height loss of the C7 vertebral body with marrow edema, suggesting unhealed and/or progressive fracture.  2.  Tiny left epidural collection at the level of C7 which may reflect residual epidural or subdural fluid/hematoma related to recent trauma, without substantial  associated mass effect.  3.  No cord signal abnormality. Moderate canal stenosis at the level of C3-C4. Multilevel additional stenoses as above.  4.  Healing subacute fractures in the posterior elements of C5 and C6, better seen on recent CT, with residual edema.  5.  Diffuse heterogeneous spinal marrow signal is nonspecific, but can be seen in the setting of chronic anemia, chronic hypoxemia (such as smoking),  andmarrow infiltrative/myeloproliferative disorders.      No results found for: HGBA1C Lab Results  Component Value Date   VITAMINB12 217 01/04/2021   Lab Results  Component Value Date   TSH 11.57 (H) 01/04/2021   No results found for: [name]  Past Medical History:  Diagnosis Date   Asthma    Fall from height of greater than 3 feet 07/16/2016   GERD (gastroesophageal reflux disease)    Leg cramp 01/04/2021    Past Surgical History:  Procedure Laterality Date   FRACTURE SURGERY       Medications:  Outpatient Encounter Medications as of 04/24/2021  Medication Sig   albuterol (VENTOLIN HFA) 108 (90 Base) MCG/ACT inhaler Inhale 2 puffs into the lungs every 4 (four) hours as needed for wheezing or shortness of breath.   cetirizine (ZYRTEC) 10 MG tablet Take 1 tablet (10 mg total) by mouth daily.   diclofenac Sodium (VOLTAREN) 1 % GEL Apply to back three times as needed for pain   gatifloxacin (ZYMAXID) 0.5 % SOLN INSTILL 1  DROP IN RIGHT EYE FOUR TIMES DAILY AS DIRECTED   hydrocortisone 2.5 % cream Apply topically 2 (two) times daily. Apply to face   levothyroxine (SYNTHROID) 88 MCG tablet Take 1 tablet (88 mcg total) by mouth daily before breakfast.   pantoprazole (PROTONIX) 40 MG tablet Take 1 tablet (40 mg total) by mouth daily.   simvastatin (ZOCOR) 80 MG tablet Take 1 tablet (80 mg total) by mouth at bedtime.   sucralfate (CARAFATE) 1 g tablet TAKE 1 TABLET(1 GRAM) BY MOUTH FOUR TIMES DAILY AT BEDTIME WITH MEALS   Tiotropium Bromide Monohydrate (SPIRIVA  RESPIMAT) 2.5 MCG/ACT AERS Inhale 2 puffs into the lungs daily.   traMADol (ULTRAM) 50 MG tablet Take by mouth.   [DISCONTINUED] cetirizine (ZYRTEC) 10 MG tablet Take 1 tablet by mouth every morning. (Patient not taking: Reported on 04/24/2021)   No facility-administered encounter medications on file as of 04/24/2021.    Allergies:  Allergies  Allergen Reactions   Penicillin G Other (See Comments)    Family History: Family History  Problem Relation Age of Onset   Leukemia Mother    Heart attack Father     Social History: Social History   Tobacco Use   Smoking status: Former    Pack years: 0.00   Smokeless tobacco: Never  Substance Use Topics   Alcohol use: Yes    Alcohol/week: 6.0 standard drinks    Types: 6 Cans of beer per week   Drug use: No   Social History   Social History Narrative   Left handed   Lives in a one story home    Drinks coffee     Vital Signs:  BP 117/82   Pulse 94   Ht 5\' 4"  (1.626 m)   Wt 104 lb (47.2 kg)   SpO2 97%   BMI 17.85 kg/m    General Medical Exam: General:  Very thing appearing, poorly groomed  Neurological Exam: MENTAL STATUS including orientation to time, place, person, recent and remote memory, attention span and concentration, language, and fund of knowledge is normal.  Speech is not dysarthric.  CRANIAL NERVES: II:  No visual field defects.     III-IV-VI: Pupils equal round and reactive to light.  Normal conjugate, extra-ocular eye movements in all directions of gaze.  No nystagmus.  No ptosis.   V:  Normal facial sensation.    VII:  Normal facial symmetry and movements.   VIII:  Normal hearing and vestibular function.   IX-X:  Normal palatal movement.   XI:  Normal shoulder shrug and head rotation.   XII:  Normal tongue strength and range of motion, no deviation or fasciculation.  MOTOR:  Generalized loss of muscle bulk throughout.  No fasciculations or abnormal movements.  No pronator drift.   Upper Extremity:   Right  Left  Deltoid  5/5   5/5   Biceps  5/5   5/5   Triceps  5/5   5/5   Infraspinatus 5/5  5/5  Medial pectoralis 5/5  5/5  Wrist extensors  5/5   5/5   Wrist flexors  5/5   5/5   Finger extensors  5/5   5/5   Finger flexors  5/5   5/5   Dorsal interossei  5/5   5/5   Abductor pollicis  5/5   5/5   Tone (Ashworth scale)  0  0   Lower Extremity:  Right  Left  Hip flexors  5/5   5/5   Hip extensors  5/5   5/5   Adductor 5/5  5/5  Abductor 5/5  5/5  Knee flexors  5-/5   5/5   Knee extensors  5/5   5/5   Dorsiflexors  5/5   5/5   Plantarflexors  5/5   5/5   Toe extensors  5/5   5/5   Toe flexors  4/5   5/5   Tone (Ashworth scale)  0  0   MSRs:  Right        Left                  brachioradialis 2+  2+  biceps 2+  2+  triceps 2+  2+  patellar 3+  3+  ankle jerk 0  2+  Hoffman no  no  plantar response down  down   SENSORY:  Normal and symmetric perception of light touch, pinprick, vibration, and proprioception.   COORDINATION/GAIT: Normal finger-to- nose-finger.  Intact rapid alternating movements bilaterally. Gait appears slow, wide-based, unassited.  Stressed and tandem gait not tested  IMPRESSION: Right radicular leg pain most likely due to lumbar canal stenosis with neurogenic claudication.  Exam shows brisk patella reflexes with absent right Achilles reflex and weakness involving the S1-myotome suggestive of impingement at this level. No signs of peripheral neuropathy on his exam.   - MRI lumbar spine wo contrast will be ordered  - He is too weak to engage in PT at this time  2. Nausea, vomiting, upper GI bleed with history of alcoholism.  I suspect he also has malnutrition from combination of chronic alcohol use and poor PO intake  - Follow-up with PCP  Further recommendations pending results.    Thank you for allowing me to participate in patient's care.  If I can answer any additional questions, I would be pleased to do so.    Sincerely,    Euline Kimbler K.  Posey Pronto, DO

## 2021-04-25 NOTE — Telephone Encounter (Signed)
Noted  

## 2021-05-08 ENCOUNTER — Ambulatory Visit: Payer: Medicare Other | Admitting: Nurse Practitioner

## 2021-05-13 NOTE — Progress Notes (Signed)
Subjective:    Patient ID: Lee Wilson, male    DOB: 03-26-1946, 75 y.o.   MRN: [id number]  HPI: Lee Wilson is a 75 y.o. male presenting for follow up.   Chief Complaint  Patient presents with   Follow-up   Patient reports he is doing a little better since I saw him last.  However he has continued to lose weight.  He is only able to eat very small bites of food.  He is trying to drink ensure.  He has pain in his back and chest.  The chest pain comes and goes throughout the day and laying down seems to trigger it.    He is still spitting up some phlegm and has a hard time swallowing food and keeping it down.  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  It says he was referred to an Oncologist but he has not heard from anyone.  He has also not heard anything about the biopsy results.  CHEST PAIN Time since onset: weeks  Duration: minutes-hours Onset: sudden Quality: burning Severity: moderate to severe Location: lower chest - points to bottom of sternum Radiation: yes; up chest and around to back on both sides Episode duration: minutes Frequency: multiple times daily Related to exertion: no Activity when pain started:  laying down Trauma: no; does have known partially obstructing esophageal mass Anxiety/recent stressors: yes Aggravating factors: laying down Alleviating factors: nothing Status: stable Treatments attempted: omeprazole, tums, rest Current pain status: no pain Shortness of breath: no Cough: yes, non-productive Nausea: no Diaphoresis: no Heartburn: yes Palpitations: no  BACK PAIN Duration: acute Mechanism of injury: none Location: "all over" Onset: sudden Severity: moderate-severe Quality:  sharp Frequency: multiple times daily Radiation: none Aggravating factors: laying down, certain movements Alleviating factors: changing positions, time Status:  worse Treatments attempted:  changing positions Relief with NSAIDs?: No NSAIDs Taken Nighttime pain:  no Paresthesias / decreased sensation:  no Bowel / bladder incontinence:  no Fevers:  no Dysuria / urinary frequency:  no  We talked about advanced directives today and he told me his emergency contact is his lady friend, Stanton Kidney.    Allergies  Allergen Reactions   Penicillin G Other (See Comments)    Outpatient Encounter Medications as of 05/15/2021  Medication Sig   lidocaine (LIDODERM) 5 % Place 1 patch onto the skin daily. Remove & Discard patch within 12 hours or as directed by MD   albuterol (VENTOLIN HFA) 108 (90 Base) MCG/ACT inhaler Inhale 2 puffs into the lungs every 4 (four) hours as needed for wheezing or shortness of breath.   cetirizine (ZYRTEC) 10 MG tablet Take 1 tablet (10 mg total) by mouth daily.   diclofenac Sodium (VOLTAREN) 1 % GEL Apply to back three times as needed for pain   gatifloxacin (ZYMAXID) 0.5 % SOLN INSTILL 1 DROP IN RIGHT EYE FOUR TIMES DAILY AS DIRECTED   hydrocortisone 2.5 % cream Apply topically 2 (two) times daily. Apply to face   levothyroxine (SYNTHROID) 88 MCG tablet Take 1 tablet (88 mcg total) by mouth daily before breakfast.   pantoprazole (PROTONIX) 40 MG tablet Take 1 tablet (40 mg total) by mouth daily.   simvastatin (ZOCOR) 80 MG tablet Take 1 tablet (80 mg total) by mouth at bedtime.   sucralfate (CARAFATE) 1 g tablet TAKE 1 TABLET(1 GRAM) BY MOUTH FOUR TIMES DAILY AT BEDTIME WITH MEALS  Tiotropium Bromide Monohydrate (SPIRIVA RESPIMAT) 2.5 MCG/ACT AERS Inhale 2 puffs into the lungs daily.   traMADol (ULTRAM) 50 MG tablet Take 1 tablet (50 mg total) by mouth every 12 (twelve) hours as needed for moderate pain or severe pain.   [DISCONTINUED] traMADol (ULTRAM) 50 MG tablet Take by mouth.   No facility-administered encounter medications on file as of 05/15/2021.    Patient Active Problem List   Diagnosis Date Noted   Esophageal mass  05/15/2021   Chest pain 05/15/2021   Rash 02/16/2021   Macrocytic anemia 01/04/2021   DDD (degenerative disc disease), lumbar 12/09/2020   Hypothyroidism 12/01/2018   Alcohol use 12/01/2018   Protein-calorie malnutrition ([place]) 03/25/2018   Carotid artery disease ([place]) 07/25/2016   Cerebrovascular disease or lesion 07/25/2016   Lytic bone lesions on xray 07/25/2016   C6 cervical fracture ([place]) 07/16/2016   C7 cervical fracture ([place]) 07/16/2016   Spleen hematoma 07/16/2016   T1 vertebral fracture ([place]) 07/16/2016   Chronic bronchitis ([place]) 09/22/2013   GERD (gastroesophageal reflux disease) 09/22/2013    Past Medical History:  Diagnosis Date   Asthma    Fall from height of greater than 3 feet 07/16/2016   GERD (gastroesophageal reflux disease)    Leg cramp 01/04/2021    Relevant past medical, surgical, family and social history reviewed and updated as indicated. Interim medical history since our last visit reviewed.  Review of Systems Per HPI unless specifically indicated above     Objective:    BP 102/72   Pulse 82   Temp 98 F (36.7 C)   Ht '5\' 3"'$  (1.6 m)   Wt 99 lb 6.4 oz (45.1 kg)   SpO2 100%   BMI 17.61 kg/m   Wt Readings from Last 3 Encounters:  05/15/21 99 lb 6.4 oz (45.1 kg)  04/24/21 104 lb (47.2 kg)  04/20/21 106 lb 12.8 oz (48.4 kg)    Physical Exam Vitals and nursing note reviewed.  Constitutional:      General: He is not in acute distress.    Appearance: He is cachectic. He is not ill-appearing or toxic-appearing.  Eyes:     General: No scleral icterus.    Extraocular Movements: Extraocular movements intact.  Cardiovascular:     Rate and Rhythm: Normal rate and regular rhythm.     Heart sounds: Normal heart sounds. No murmur heard. Pulmonary:     Effort: Pulmonary effort is normal.     Breath sounds: Normal breath sounds.  Abdominal:     General: Abdomen is flat. Bowel sounds are decreased.     Palpations: Abdomen is soft.  Musculoskeletal:      Cervical back: Normal range of motion.     Right lower leg: No edema.     Left lower leg: No edema.  Lymphadenopathy:     Cervical: No cervical adenopathy.  Skin:    General: Skin is warm and dry.     Capillary Refill: Capillary refill takes less than 2 seconds.     Coloration: Skin is not jaundiced or pale.     Findings: No erythema.  Neurological:     Mental Status: He is oriented to person, place, and time.  Psychiatric:        Mood and Affect: Mood normal.        Behavior: Behavior normal. Behavior is cooperative.        Judgment: Judgment normal.       Assessment & Plan:   Problem List Items Addressed This  Visit       Cardiovascular and Mediastinum   Carotid artery disease ([place])    Last US carotids showed 50-69% stenosis right ICA and less than 50% right ICA.  He has been on simvastatin 80 mg daily, although recently has had trouble taking medication regularly secondary to obstructing esophageal mass.  Will plan to repeat lipids at next visit when patient returns fasting.          Other   Protein-calorie malnutrition ([place])    Meets criteria with obvious muscle wasting and >7.5% weight loss in 3 months.  Feb 16, 2021 he weight 112 and today he weights 99 lbs.  He is eating small bites of food and trying to drink ensure.  We will check CMET and CBC today to evaluate for protein abnormalities.  He does not appear dehydrated today - vital signs are stable.  If weight loss continues, will need to be admitted to hospital for nutrition support.  I have referred urgently to oncology today for the esophageal tumor, however we do not have biopsy results back from [facility] so we will also reach out the them.         Esophageal mass    First noted on CT abdomen/pelvis 04/24/21.  EGD with [facility] 07/25 showed partially obstructing tumor and biopsy was taken.  Apparently at that time, a referral to oncology was placed.  The patient has not heard back on results of the biopsy or on  referral to oncology. Today I will place urgent referral to oncology and we will call [facility] to have them forward the results to the oncologist as soon as possible.         Relevant Orders   CBC with Differential (Completed)   Ambulatory referral to Hematology / Oncology   COMPLETE METABOLIC PANEL WITH GFR (Completed)   BASIC METABOLIC PANEL WITH GFR (Completed)   Chest pain - Primary    Acute.  EKG today is reassuring.  At this time, I do not think his pain is cardiac related as he has a known partially obstructing tumor at the end of his esophagus.  Encouraged patient to continue taking omeprazole as prescribed by GI last week.  Urgent referral to oncology has been placed to address esophageal tumor, although we do not have the biopsy results back yet.        Relevant Orders   EKG 12-Lead (Completed)   COMPLETE METABOLIC PANEL WITH GFR (Completed)   BASIC METABOLIC PANEL WITH GFR (Completed)   Other Visit Diagnoses     Acute midline thoracic back pain       CT abdomen showed T12 compression abnormality - I suspect this may be associated with pain today.  Will start lidocaine patches and tramadol prn. PDMP reviwed.    Relevant Medications   lidocaine (LIDODERM) 5 %   traMADol (ULTRAM) 50 MG tablet        Follow up plan: Return for pending lab work.

## 2021-05-15 ENCOUNTER — Ambulatory Visit (INDEPENDENT_AMBULATORY_CARE_PROVIDER_SITE_OTHER): Payer: Medicare Other | Admitting: Nurse Practitioner

## 2021-05-15 ENCOUNTER — Encounter: Payer: Self-pay | Admitting: Nurse Practitioner

## 2021-05-15 ENCOUNTER — Other Ambulatory Visit: Payer: Self-pay

## 2021-05-15 VITALS — BP 102/72 | HR 82 | Temp 98.0°F | Ht 63.0 in | Wt 99.4 lb

## 2021-05-15 DIAGNOSIS — I6521 Occlusion and stenosis of right carotid artery: Secondary | ICD-10-CM

## 2021-05-15 DIAGNOSIS — R079 Chest pain, unspecified: Secondary | ICD-10-CM | POA: Diagnosis not present

## 2021-05-15 DIAGNOSIS — K2289 Other specified disease of esophagus: Secondary | ICD-10-CM | POA: Diagnosis not present

## 2021-05-15 DIAGNOSIS — M546 Pain in thoracic spine: Secondary | ICD-10-CM

## 2021-05-15 DIAGNOSIS — E441 Mild protein-calorie malnutrition: Secondary | ICD-10-CM

## 2021-05-15 DIAGNOSIS — E039 Hypothyroidism, unspecified: Secondary | ICD-10-CM

## 2021-05-15 MED ORDER — LIDOCAINE 5 % EX PTCH: 1.0000 | MEDICATED_PATCH | CUTANEOUS | 0 refills | Status: DC

## 2021-05-15 MED ORDER — TRAMADOL HCL 50 MG PO TABS: 50.0000 mg | ORAL_TABLET | Freq: Two times a day (BID) | ORAL | 0 refills | Status: DC | PRN

## 2021-05-16 ENCOUNTER — Telehealth: Payer: Self-pay

## 2021-05-16 LAB — BASIC METABOLIC PANEL WITH GFR
BUN: 21 mg/dL (ref 7–25)
CO2: 25 mmol/L (ref 20–32)
Calcium: 9.2 mg/dL (ref 8.6–10.3)
Chloride: 98 mmol/L (ref 98–110)
Creat: 1.04 mg/dL (ref 0.70–1.28)
Glucose, Bld: 91 mg/dL (ref 65–99)
Potassium: 3.6 mmol/L (ref 3.5–5.3)
Sodium: 139 mmol/L (ref 135–146)
eGFR: 76 mL/min/{1.73_m2} (ref >=60)

## 2021-05-16 LAB — CBC WITH DIFFERENTIAL/PLATELET
Absolute Monocytes: 598 cells/uL (ref 200–950)
Basophils Absolute: 39 cells/uL (ref 0–200)
Basophils Relative: 0.6 %
Eosinophils Absolute: 111 cells/uL (ref 15–500)
Eosinophils Relative: 1.7 %
HCT: 40.8 % (ref 38.5–50.0)
Hemoglobin: 13.8 g/dL (ref 13.2–17.1)
Lymphs Abs: 2269 cells/uL (ref 850–3900)
MCH: 33.1 pg — ABNORMAL HIGH (ref 27.0–33.0)
MCHC: 33.8 g/dL (ref 32.0–36.0)
MCV: 97.8 fL (ref 80.0–100.0)
MPV: 9.6 fL (ref 7.5–12.5)
Monocytes Relative: 9.2 %
Neutro Abs: 3484 cells/uL (ref 1500–7800)
Neutrophils Relative %: 53.6 %
Platelets: 304 10*3/uL (ref 140–400)
RBC: 4.17 10*6/uL — ABNORMAL LOW (ref 4.20–5.80)
RDW: 11.9 % (ref 11.0–15.0)
Total Lymphocyte: 34.9 %
WBC: 6.5 10*3/uL (ref 3.8–10.8)

## 2021-05-16 LAB — COMPLETE METABOLIC PANEL WITH GFR
AG Ratio: 1.1 (calc) (ref 1.0–2.5)
ALT: 14 U/L (ref 9–46)
AST: 19 U/L (ref 10–35)
Albumin: 3.7 g/dL (ref 3.6–5.1)
Alkaline phosphatase (APISO): 52 U/L (ref 35–144)
BUN: 22 mg/dL (ref 7–25)
CO2: 25 mmol/L (ref 20–32)
Calcium: 9.1 mg/dL (ref 8.6–10.3)
Chloride: 99 mmol/L (ref 98–110)
Creat: 1.05 mg/dL (ref 0.70–1.28)
Globulin: 3.4 g/dL (calc) (ref 1.9–3.7)
Glucose, Bld: 92 mg/dL (ref 65–99)
Potassium: 3.7 mmol/L (ref 3.5–5.3)
Sodium: 140 mmol/L (ref 135–146)
Total Bilirubin: 0.5 mg/dL (ref 0.2–1.2)
Total Protein: 7.1 g/dL (ref 6.1–8.1)
eGFR: 75 mL/min/{1.73_m2} (ref >=60)

## 2021-05-16 NOTE — Assessment & Plan Note (Signed)
Meets criteria with obvious muscle wasting and >7.5% weight loss in 3 months.  Feb 16, 2021 he weight 112 and today he weights 99 lbs.  He is eating small bites of food and trying to drink ensure.  We will check CMET and CBC today to evaluate for protein abnormalities.  He does not appear dehydrated today - vital signs are stable.  If weight loss continues, will need to be admitted to hospital for nutrition support.  I have referred urgently to oncology today for the esophageal tumor, however we do not have biopsy results back from [facility] so we will also reach out the them.

## 2021-05-16 NOTE — Telephone Encounter (Signed)
[facility] called in today stating that they had received a call for a request of records to be sent to their office for this pt. [facility] also stated that they were receiving a request for records from the [facility] for an appt that was for tomorrow 8/3,which has now been canceled but they are not sure if the pt canceled, or if this was a cancellation made by the [facility]. [facility] just wanted to inform the pt's pcp that they were still waiting to receive some more records on this pt and will follow up when all records have been received.

## 2021-05-16 NOTE — Assessment & Plan Note (Signed)
First noted on CT abdomen/pelvis 04/24/21.  EGD with [facility] 07/25 showed partially obstructing tumor and biopsy was taken.  Apparently at that time, a referral to oncology was placed.  The patient has not heard back on results of the biopsy or on referral to oncology. Today I will place urgent referral to oncology and we will call [facility] to have them forward the results to the oncologist as soon as possible.

## 2021-05-16 NOTE — Assessment & Plan Note (Signed)
Last US carotids showed 50-69% stenosis right ICA and less than 50% right ICA.  He has been on simvastatin 80 mg daily, although recently has had trouble taking medication regularly secondary to obstructing esophageal mass.  Will plan to repeat lipids at next visit when patient returns fasting.

## 2021-05-16 NOTE — Assessment & Plan Note (Signed)
Acute.  EKG today is reassuring.  At this time, I do not think his pain is cardiac related as he has a known partially obstructing tumor at the end of his esophagus.  Encouraged patient to continue taking omeprazole as prescribed by GI last week.  Urgent referral to oncology has been placed to address esophageal tumor, although we do not have the biopsy results back yet.

## 2021-05-17 ENCOUNTER — Other Ambulatory Visit: Payer: Self-pay

## 2021-05-17 ENCOUNTER — Ambulatory Visit: Payer: Medicare Other | Admitting: Nutrition

## 2021-05-17 ENCOUNTER — Inpatient Hospital Stay: Payer: Medicare Other | Attending: Oncology | Admitting: Oncology

## 2021-05-17 VITALS — BP 121/75 | HR 78 | Temp 98.2°F | Resp 20 | Ht 63.0 in | Wt 97.2 lb

## 2021-05-17 DIAGNOSIS — R1310 Dysphagia, unspecified: Secondary | ICD-10-CM

## 2021-05-17 DIAGNOSIS — Z87891 Personal history of nicotine dependence: Secondary | ICD-10-CM | POA: Insufficient documentation

## 2021-05-17 DIAGNOSIS — R634 Abnormal weight loss: Secondary | ICD-10-CM | POA: Diagnosis not present

## 2021-05-17 DIAGNOSIS — C155 Malignant neoplasm of lower third of esophagus: Secondary | ICD-10-CM | POA: Diagnosis not present

## 2021-05-17 DIAGNOSIS — G8929 Other chronic pain: Secondary | ICD-10-CM | POA: Insufficient documentation

## 2021-05-17 DIAGNOSIS — C159 Malignant neoplasm of esophagus, unspecified: Secondary | ICD-10-CM | POA: Insufficient documentation

## 2021-05-17 DIAGNOSIS — M549 Dorsalgia, unspecified: Secondary | ICD-10-CM | POA: Insufficient documentation

## 2021-05-17 DIAGNOSIS — F1021 Alcohol dependence, in remission: Secondary | ICD-10-CM

## 2021-05-17 DIAGNOSIS — R630 Anorexia: Secondary | ICD-10-CM

## 2021-05-17 DIAGNOSIS — K2289 Other specified disease of esophagus: Secondary | ICD-10-CM

## 2021-05-17 MED ORDER — HYDROCODONE-ACETAMINOPHEN 7.5-325 MG/15ML PO SOLN: 10.0000 mL | Freq: Four times a day (QID) | ORAL | 0 refills | Status: DC | PRN

## 2021-05-17 NOTE — Progress Notes (Signed)
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 Benay Spice. Patient is being worked up for plan of care.  PMH includes asthma, GERD, Hypothyroidism, and ETOH ( 6 pack nightly for 40 years quit ~ 2 months ago)  Medications include Synthroid, Protonix.  Labs reviewed.  Height: '5\' 3"'$ . Weight: 97.2 pounds. UBW: 117 pounds  BMI: 17.22.  14% weight loss over 3 months which is significant.  Estimated Nutrition Needs:1450 - 1650 kcal, 60-70 gm protein, 1.7 L fluid.  Patient reports he has been sick for "2 years." He cannot swallow anything but water, gatorade, and juice. He has not tried milk. States applesauce and grits came back up. He tried some different nutrition drinks. States he drank one, laid down to sleep and it came back up. On average, is only drinking one daily. He doesn't think he tolerated a chocolate drink. Does not like broth or soups. Tells me it has been about 2 weeks since he had a BM but tells MD it's been about 4 days. He didn't like the "powder stuff" for his bowels, confirming it was Miralax.   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  Intervention: Educated to gradually increase to 4 cartons of Ensure Plus daily to provide 1400 calories, 64 gm protein. Educated on full liquid diet, stressing importance of thinning thickened liquids to consistency he can swallow easily. Provided a fact sheet. Educated on putting foods in a blender for easier swallowing or purchasing baby foods from the grocery store. He thinks he would rather buy baby food then to blenderize other foods. Provided samples and coupons. Also provided one complimentary case of ensure plus. Questions answered and teach back method used. Contact information given.  Monitoring,  Evaluation, Goals: Patient will tolerate increased calories and protein to minimize further weight loss.  Next Visit: To be scheduled with upcoming appointments.

## 2021-05-17 NOTE — Progress Notes (Signed)
Lee Wilson New Patient Consult   Requesting MD: Eulogio Bear, Np [address],  [place] [address]   Lee Wilson 75 y.o.  May 07, 1946    Reason for Consult: Esophagus cancer   HPI: Lee Wilson reports a 44-monthhistory of progressive solid dysphagia.  He can tolerate liquids at times, but at other times regurgitates liquids.  He saw his primary provider and was referred for a CT of the abdomen and pelvis on 04/24/2021.  This revealed masslike thickening of the distal esophagus concerning for malignancy.  A T12 compression fracture was new from December 2017 and is age-indeterminate.  No focal liver lesion.  No adenopathy.  He was referred to Dr. BAlessandra Bevelsand was taken to an upper endoscopy on 05/08/2021.  A fungating and ulcerated mass with stigmata of recent bleeding was noted in the distal esophagus at 30 cm from the incisors.  The mass was partially obstructing.  Biopsies were obtained.  Barrett's esophagus was noted in the entire esophagus.  Scattered mild inflammation in the stomach.  The pathology revealed reactive gastropathy involving the stomach biopsy.  Negative for H. pylori.  The esophagus mass with no evidence of malignancy.  Past Medical History:  Diagnosis Date   Asthma    Fall from height of greater than 3 feet 07/16/2016   GERD (gastroesophageal reflux disease)    Leg cramp 01/04/2021    .    Past Surgical History:  Procedure Laterality Date   FRACTURE SURGERY      Medications: Reviewed  Allergies:  Allergies  Allergen Reactions   Penicillin G Other (See Comments)    Family history: His mother had "leukemia ".  Social History:   [place]  He quit smoking cigarettes at age 75  He has a history of heavy beer drinking.  He quit drinking approximately 2 months ago.  ROS:   Positives include: Anorexia, 20 pound weight loss, solid/liquid dysphagia, subxiphoid discomfort  A complete ROS was  otherwise negative.  Physical Exam:  Blood pressure 121/75, pulse 78, temperature 98.2 F (36.8 C), temperature source Oral, resp. rate 20, height _0  (1.6 m), weight 97 lb 3.2 oz (44.1 kg), SpO2 99 %.  HEENT: Neck without mass Lungs: Clear bilaterally Cardiac: Regular rate and rhythm Abdomen: No hepatosplenomegaly, mild subxiphoid tenderness, no mass GU: Uncircumcised male, testes without mass Vascular: No leg edema Lymph nodes: No cervical, supraclavicular, axillary, or inguinal nodes Neurologic: Alert and oriented, the motor exam appears intact in the upper and lower extremities bilaterally Musculoskeletal: No spine tenderness   LAB:  CBC  Lab Results  Component Value Date   WBC 6.5 05/15/2021   HGB 13.8 05/15/2021   HCT 40.8 05/15/2021   MCV 97.8 05/15/2021   PLT 304 05/15/2021   NEUTROABS 3,484 05/15/2021        CMP  Lab Results  Component Value Date   NA 140 05/15/2021   K 3.7 05/15/2021   CL 99 05/15/2021   CO2 25 05/15/2021   GLUCOSE 92 05/15/2021   BUN 22 05/15/2021   CREATININE 1.05 05/15/2021   CALCIUM 9.1 05/15/2021   PROT 7.1 05/15/2021   ALBUMIN 3.5 (L) 09/10/2016   AST 19 05/15/2021   ALT 14 05/15/2021   ALKPHOS 56 09/10/2016   BILITOT 0.5 05/15/2021   GFRNONAA >89 01/14/2015   GFRAA >89 01/14/2015     Imaging:  CT images from 04/24/2021 reviewed with Lee Wilson  Assessment/Plan:   Distal esophagus mass Mass at 30 cm on EGD 05/08/2021-biopsy negative for malignancy CT abdomen/pelvis 04/24/2021-masslike thickening of the distal esophagus, T12 compression fracture-age-indeterminate  Solid/liquid dysphagia secondary to #1 Chronic back pain T12 compression fracture-likely benign History of heavy alcohol use Remote history of tobacco use History of gastroesophageal reflux disease Barrett's esophagus on EGD 05/08/2021 Weight loss secondary to #1   Disposition:   Lee Wilson presents with dysphagia.  He was found to have a  distal esophagus mass on CT 04/24/2021 and on upper endoscopy 05/03/2021.  A biopsy of the distal esophagus mass was negative for malignancy.  I have a high clinical suspicion for esophagus cancer based on the CT findings and his symptoms.  We will refer him to Dr. Alessandra Bevels for repeat endoscopy.  Lee Wilson will be scheduled for a staging PET scan.  He will be referred to radiation oncology.  He met with the Cancer center nutritionist today for recommendations regarding a liquid diet.  He will begin a trial of hydrocodone liquid to use as needed for pain.  He will return for an office visit next week.  Lee Wilson will be checked provided assistance with rides to the Cancer center as needed.  Betsy Coder, MD  05/17/2021, 2:35 PM

## 2021-05-17 NOTE — Addendum Note (Signed)
Addended by: Lenox Ponds E on: 05/17/2021 03:05 PM   Modules accepted: Orders

## 2021-05-17 NOTE — Telephone Encounter (Signed)
Please clarify if action is needed from [place].  [facility] needs to send biopsy results to cancer center; cancer center contacted [place] earlier this week saying they did not have records.

## 2021-05-19 NOTE — Progress Notes (Signed)
Late entry, 8/3 Faxed request to [facility] for pt to have repeat endo with biopsy per Dr Benay Spice request.  [facility] scheduled him for repeat endo on 05/31/2021 at 1130.

## 2021-05-22 ENCOUNTER — Ambulatory Visit (HOSPITAL_COMMUNITY): Admission: RE | Admit: 2021-05-22 | Payer: Medicare Other | Source: Ambulatory Visit

## 2021-05-22 NOTE — Progress Notes (Signed)
GI Location of Tumor / Histology: Esophageal Cancer  Lee Wilson presented with a 3 month history of progressive solid dysphagia.  He can tolerate liquids at times, but at other times regurgitates liquids.  He was seen by his PCP and was referred for a CT AP.  Endo 05/31/2020:  PET 05/26/2021:  Upper Endo 05/08/2021: Fungating and ulcerated mass with stigmata of recent bleeding was noted in the distal esophagus at 30 cm from the incisors.  The mass was partially obstructing.  Barrett's esophagus was noted int the entire esophagus.  Scattered mild inflammation in the stomach.  CT AP 04/24/2021: Mass-like thickening of the distal esophagus, concerning for malignancy. Further evaluation with endoscopy is recommended.  Moderate compression deformity of the T12 vertebral body is new from prior, although age indeterminate. Correlate with point tenderness.  Biopsies of Esophageal Mass 05/08/2021    Past/Anticipated interventions by surgeon, if any:   Past/Anticipated interventions by medical oncology, if any:  Dr. Benay Spice 05/17/2021 -Lee Wilson presents with dysphagia.   -He was found to have a distal esophagus mass on CT 04/24/2021 and on upper endoscopy 05/03/2021.   -A biopsy of the distal esophagus mass was negative for malignancy.  I have a high clinical suspicion for esophagus cancer based on the CT findings and his symptoms.   -We will refer him to Dr. Alessandra Bevels for repeat endoscopy. -Lee Wilson will be scheduled for a staging PET scan.  He will be referred to radiation oncology.    Weight changes, if any: Down roughly 18 pounds.  Bowel/Bladder complaints, if any:   Nausea / Vomiting, if any: Reports vomiting and nausea.  Pain issues, if any:  Has pains in his chest/esophagus     Appetite: Eating softer fruits and vegetables, meats are getting hung in his throat.  Trying to supplement with ensure when he can keep it down.  He is drinking water well.  Taking Hycet.  SAFETY  ISSUES: Prior radiation? No Pacemaker/ICD? No Possible current pregnancy? N/a Is the patient on methotrexate? No  Current Complaints/Details:

## 2021-05-24 ENCOUNTER — Other Ambulatory Visit: Payer: Self-pay

## 2021-05-24 ENCOUNTER — Ambulatory Visit
Admission: RE | Admit: 2021-05-24 | Discharge: 2021-05-24 | Disposition: A | Discharge status: Home / Self Care | Payer: Medicare Other | Source: Ambulatory Visit | Attending: Radiation Oncology | Admitting: Radiation Oncology

## 2021-05-24 ENCOUNTER — Encounter: Payer: Self-pay | Admitting: Radiation Oncology

## 2021-05-24 VITALS — BP 110/72 | HR 77 | Temp 97.5°F | Resp 18 | Ht 63.0 in | Wt 97.2 lb

## 2021-05-24 DIAGNOSIS — I700 Atherosclerosis of aorta: Secondary | ICD-10-CM | POA: Diagnosis not present

## 2021-05-24 DIAGNOSIS — Z79899 Other long term (current) drug therapy: Secondary | ICD-10-CM | POA: Diagnosis not present

## 2021-05-24 DIAGNOSIS — K219 Gastro-esophageal reflux disease without esophagitis: Secondary | ICD-10-CM | POA: Insufficient documentation

## 2021-05-24 DIAGNOSIS — K2289 Other specified disease of esophagus: Secondary | ICD-10-CM

## 2021-05-24 DIAGNOSIS — Z87891 Personal history of nicotine dependence: Secondary | ICD-10-CM | POA: Insufficient documentation

## 2021-05-24 DIAGNOSIS — E43 Unspecified severe protein-calorie malnutrition: Secondary | ICD-10-CM | POA: Diagnosis not present

## 2021-05-24 DIAGNOSIS — R222 Localized swelling, mass and lump, trunk: Secondary | ICD-10-CM | POA: Insufficient documentation

## 2021-05-24 DIAGNOSIS — J45909 Unspecified asthma, uncomplicated: Secondary | ICD-10-CM | POA: Diagnosis not present

## 2021-05-24 DIAGNOSIS — Z806 Family history of leukemia: Secondary | ICD-10-CM | POA: Diagnosis not present

## 2021-05-24 NOTE — Progress Notes (Signed)
Radiation Oncology         [phone] ________________________________  Name: Lee Wilson        MRN: [id number]  Date of Service: 05/24/2021 DOB: 20-Mar-1946  ZD:[id number], Lee Carry, NP  Ladell Pier, MD     REFERRING PHYSICIAN: Ladell Pier, MD   DIAGNOSIS: The encounter diagnosis was Esophageal mass.   HISTORY OF PRESENT ILLNESS: Lee Wilson is a 75 y.o. male seen at the request of Dr. Benay Spice for putative esophageal carcinoma.  The patient presented after 3 months of progressive solid dysphagia and regurgitation and was seen by his PCP.  A CT of the abdomen and pelvis on 04/24/2021 showed masslike thickening in the distal esophagus concerning for malignancy, moderate compression deformity of T12 vertebral body was also identified and tree-in-bud opacity in the right middle lobe of the lung felt to be inflammatory in nature.  He underwent endoscopy on 05/08/2021 with Dr. Marca Ancona that, there was a partially obstructing lesion in the distal esophagus evidence of Barrett's esophagus and gastritis.  Biopsies were taken at the GE junction and ulceration was seen in the specimen.  Reactive gastropathy was seen in the stomach biopsy and this was negative for H. pylori.  It was recommended that he undergo repeat evaluation as the lesion was up to 4 cm on endoscopic evaluation.  He is scheduled for a PET scan on 05/26/2021.  He has met with Dr. Benay Spice who agreed with repeat endoscopy which is scheduled for 05/31/2021.     PREVIOUS RADIATION THERAPY: No   PAST MEDICAL HISTORY:  Past Medical History:  Diagnosis Date   Asthma    Fall from height of greater than 3 feet 07/16/2016   GERD (gastroesophageal reflux disease)    Leg cramp 01/04/2021       PAST SURGICAL HISTORY: Past Surgical History:  Procedure Laterality Date   FRACTURE SURGERY       FAMILY HISTORY:  Family History  Problem Relation Age of Onset   Leukemia Mother    Heart attack Father      SOCIAL  HISTORY:  reports that he has quit smoking. He has never used smokeless tobacco. He reports previous alcohol use. He reports that he does not use drugs. The patient is married and lives in [place].  ALLERGIES: Penicillin g   MEDICATIONS:  Current Outpatient Medications  Medication Sig Dispense Refill   albuterol (VENTOLIN HFA) 108 (90 Base) MCG/ACT inhaler Inhale 2 puffs into the lungs every 4 (four) hours as needed for wheezing or shortness of breath. 18 g 2   cetirizine (ZYRTEC) 10 MG tablet Take 1 tablet (10 mg total) by mouth daily. 30 tablet 11   diclofenac Sodium (VOLTAREN) 1 % GEL Apply to back three times as needed for pain 100 g 2   gatifloxacin (ZYMAXID) 0.5 % SOLN INSTILL 1 DROP IN RIGHT EYE FOUR TIMES DAILY AS DIRECTED     HYDROcodone-acetaminophen (HYCET) 7.5-325 mg/15 ml solution Take 10 mLs by mouth every 6 (six) hours as needed for moderate pain. 240 mL 0   hydrocortisone 2.5 % cream Apply topically 2 (two) times daily. Apply to face 30 g 2   levothyroxine (SYNTHROID) 88 MCG tablet Take 1 tablet (88 mcg total) by mouth daily before breakfast. 90 tablet 0   pantoprazole (PROTONIX) 40 MG tablet Take 1 tablet (40 mg total) by mouth daily. 90 tablet 1   simvastatin (ZOCOR) 80 MG tablet Take 1 tablet (80 mg total) by mouth at bedtime.  90 tablet 3   Tiotropium Bromide Monohydrate (SPIRIVA RESPIMAT) 2.5 MCG/ACT AERS Inhale 2 puffs into the lungs daily. 4 g 11   lidocaine (LIDODERM) 5 % Place 1 patch onto the skin daily. Remove & Discard patch within 12 hours or as directed by MD (Patient not taking: No sig reported) 30 patch 0   No current facility-administered medications for this encounter.     REVIEW OF SYSTEMS: On review of systems, the patient reports that he is struggling with his nutrition and is mainly only eating very soft or liquid foods. He is trying to drink ensure shakes but has been struggling to get more than 2 in per day. He complaints of pain with swallowing, dysphagia  and about 25 pounds of weight loss in the last 2 1/2 months. He denies any other sites of pain. No other complaints are verbalized.      PHYSICAL EXAM:  Wt Readings from Last 3 Encounters:  05/24/21 97 lb 4 oz (44.1 kg)  05/17/21 97 lb 3.2 oz (44.1 kg)  05/15/21 99 lb 6.4 oz (45.1 kg)   Temp Readings from Last 3 Encounters:  05/24/21 (!) 97.5 F (36.4 C) (Temporal)  05/17/21 98.2 F (36.8 C) (Oral)  05/15/21 98 F (36.7 C)   BP Readings from Last 3 Encounters:  05/24/21 110/72  05/17/21 121/75  05/15/21 102/72   Pulse Readings from Last 3 Encounters:  05/24/21 77  05/17/21 78  05/15/21 82   Pain Assessment Pain Score: 3  (Esophageal pains.) Pain Loc: Chest (Esophageal pains.)/10  In general this is a cachectic appearing caucasian male in no acute distress. He's alert and oriented x4 and appropriate throughout the examination. Cardiopulmonary assessment is negative for acute distress and he exhibits normal effort.     ECOG = 1  0 - Asymptomatic (Fully active, able to Wilson on all predisease activities without restriction)  1 - Symptomatic but completely ambulatory (Restricted in physically strenuous activity but ambulatory and able to Wilson out work of a light or sedentary nature. For example, light housework, office work)  2 - Symptomatic, <50% in bed during the day (Ambulatory and capable of all self care but unable to Wilson out any work activities. Up and about more than 50% of waking hours)  3 - Symptomatic, >50% in bed, but not bedbound (Capable of only limited self-care, confined to bed or chair 50% or more of waking hours)  4 - Bedbound (Completely disabled. Cannot Wilson on any self-care. Totally confined to bed or chair)  5 - Death   Eustace Pen MM, Creech RH, Tormey DC, et al. [id number]). "Toxicity and response criteria of the [facility]". [place] Oncol. 5 (6): 649-55    LABORATORY DATA:  Lab Results  Component Value Date   WBC 6.5  05/15/2021   HGB 13.8 05/15/2021   HCT 40.8 05/15/2021   MCV 97.8 05/15/2021   PLT 304 05/15/2021   Lab Results  Component Value Date   NA 140 05/15/2021   K 3.7 05/15/2021   CL 99 05/15/2021   CO2 25 05/15/2021   Lab Results  Component Value Date   ALT 14 05/15/2021   AST 19 05/15/2021   ALKPHOS 56 09/10/2016   BILITOT 0.5 05/15/2021      RADIOGRAPHY: CT Abdomen Pelvis Wo Contrast  Result Date: 04/24/2021 CLINICAL DATA:  Abdominal pain with loss of appetite EXAM: CT ABDOMEN AND PELVIS WITHOUT CONTRAST TECHNIQUE: Multidetector CT imaging of the abdomen and pelvis was performed  following the standard protocol without IV contrast. COMPARISON:  09/19/2016 FINDINGS: Lower chest: Eccentric mass-like thickening of the distal esophagus (series 2, image 5). Minimal tree-in-bud opacity within the dependent right lung base and dependent portion of the right middle lobe. Heart size is normal. Hepatobiliary: Unremarkable unenhanced appearance of the liver. No focal liver lesion identified. Gallbladder within normal limits. No hyperdense gallstone. No biliary dilatation. Pancreas: Unremarkable. No pancreatic ductal dilatation or surrounding inflammatory changes. Spleen: Normal in size without focal abnormality. Adrenals/Urinary Tract: Adrenal glands are unremarkable. Kidneys are normal, without renal calculi, focal lesion, or hydronephrosis. Bladder is unremarkable. Stomach/Bowel: Stomach is within normal limits. No evidence of bowel wall thickening, distention, or inflammatory changes. Vascular/Lymphatic: Scattered aortoiliac atherosclerotic calcifications without aneurysm. No abdominopelvic lymphadenopathy. Reproductive: Nonenlarged prostate gland. Other: No free fluid. No abdominopelvic fluid collection. No pneumoperitoneum. No abdominal wall hernia. Musculoskeletal: Moderate compression deformity of the T12 vertebral body is new from prior, although age indeterminate. Degenerative changes of the  bilateral hips. No suspicious bone lesions. IMPRESSION: 1. Mass-like thickening of the distal esophagus, concerning for malignancy. Further evaluation with endoscopy is recommended. 2. Moderate compression deformity of the T12 vertebral body is new from prior, although age indeterminate. Correlate with point tenderness. 3. Minimal tree-in-bud opacity within the dependent portion of the right lung base and dependent portion of the right middle lobe may represent aspiration and/or an infectious or inflammatory bronchiolitis. Aortic Atherosclerosis (ICD10-I70.0). These results will be called to the ordering clinician or representative by the Radiologist Assistant, and communication documented in the PACS or [employer]. Electronically Signed   By: Davina Poke D.O.   On: 04/24/2021 14:15       IMPRESSION/PLAN: 1. Putative esophageal carcinoma. Dr. Lisbeth Renshaw discusses the pathology findings and reviews the nature of esophageal carcinoma. He discusses that we believe the patient has at least locally advanced disease based on imaging and clinical symptoms. He will go for PET scan later this week, and also repeat EGD next week for tissue confirmation.  We discussed definitive versus palliative modalities of radiotherapy. We spent most of our time discussing definitive chemoRT. We discussed the risks, benefits, short, and long term effects of radiotherapy, as well as the curative intent, and the patient is interested in proceeding if appropriate based on his work up. Dr. Lisbeth Renshaw discusses the delivery and logistics of radiotherapy and anticipates a course of 5 1/2 weeks of radiotherapy to the esophagus with concurrent chemotherapy. Written consent is obtained and placed in the chart, a copy was provided to the patient. He will simulate next Wednesday and we anticipate starting treatment if a tissue diagnosis can be made on 06/05/21. We will follow up with PET imaging as well to help guide the role for possible  esophagectomy.  2. Severe Protein Calorie Malnutrition. The patient will likely need enteral nutritional support if he is unable to maintain his weight. He will be working with nutrition to coordinate this and we will revisit this if needed.  In a visit lasting 60 minutes, greater than 50% of the time was spent face to face discussing the patient's condition, in preparation for the discussion, and coordinating the patient's care.   The above documentation reflects my direct findings during this shared patient visit. Please see the separate note by Dr. Lisbeth Renshaw on this date for the remainder of the patient's plan of care.    Carola Rhine, [facility]   **Disclaimer: This note was dictated with voice recognition software. Similar sounding words can inadvertently be transcribed  and this note may contain transcription errors which may not have been corrected upon publication of note.**

## 2021-05-25 ENCOUNTER — Telehealth: Payer: Self-pay | Admitting: *Deleted

## 2021-05-25 ENCOUNTER — Encounter: Payer: Self-pay | Admitting: General Practice

## 2021-05-25 NOTE — Progress Notes (Signed)
[place] Psychosocial Distress Screening Clinical Social Work  Clinical Social Work was referred by distress screening protocol.  The patient scored a 7 on the Psychosocial Distress Thermometer which indicates moderate distress. Clinical Social Worker contacted patient by phone to assess for distress and other psychosocial needs. Unable to speak w patient, spoke w partner Stanton Kidney.  "It is hard to get him to eat because he cannot swallow."  Her main concern is obtaining food that he is able to eat - states that food is sometimes hard to afford.  Will refer to T Green, Estate manager/land agent, for [employer].  They are also hoping to use [employer] [employer] for rides to/from radiation - CSW will refer to this program as well.  They would like to speak w dietitian as eating is a major concern, message sent to [facility] via secure chat.  Briefly described resources available through [employer] and encouraged them to reach out as needed.    ONCBCN DISTRESS SCREENING 05/24/2021  Screening Type Initial Screening  Distress experienced in past week (1-10) 7  Practical problem type Food  Family Problem type Children  Emotional problem type Depression;Adjusting to illness  Information Concerns Type   Physical Problem type Pain;Nausea/vomiting;Sleep/insomnia;Loss of appetitie;Skin dry/itchy  Other Contact via [phone]    Clinical Social Worker follow up needed: No.  If yes, follow up plan:  Beverely Pace, [place], LCSW Clinical Social Worker Phone:  [phone]

## 2021-05-25 NOTE — Telephone Encounter (Signed)
Called patient to remind him of PET scan tomorrow with arrival at 0900/0930 and to be NPO for 6 hours prior. He understands and agrees. Offered to move his OV to be worked in after the scan and he agrees to come to [facility] after PET. Has a friend to transport him tomorrow. He expressed that transportation did not work for him in past and he does not understand the process. Sent email to [facility] to reach out to him.

## 2021-05-26 ENCOUNTER — Other Ambulatory Visit: Payer: Self-pay

## 2021-05-26 ENCOUNTER — Inpatient Hospital Stay (HOSPITAL_BASED_OUTPATIENT_CLINIC_OR_DEPARTMENT_OTHER): Payer: Medicare Other | Admitting: Nurse Practitioner

## 2021-05-26 ENCOUNTER — Ambulatory Visit (HOSPITAL_COMMUNITY)
Admission: RE | Admit: 2021-05-26 | Discharge: 2021-05-26 | Disposition: A | Discharge status: Home / Self Care | Payer: Medicare Other | Source: Ambulatory Visit | Attending: Oncology | Admitting: Oncology

## 2021-05-26 ENCOUNTER — Encounter: Payer: Self-pay | Admitting: Nurse Practitioner

## 2021-05-26 VITALS — BP 131/71 | HR 88 | Temp 98.1°F | Resp 20 | Ht 63.0 in | Wt 97.2 lb

## 2021-05-26 DIAGNOSIS — K2289 Other specified disease of esophagus: Secondary | ICD-10-CM

## 2021-05-26 DIAGNOSIS — M549 Dorsalgia, unspecified: Secondary | ICD-10-CM | POA: Diagnosis not present

## 2021-05-26 DIAGNOSIS — Z87891 Personal history of nicotine dependence: Secondary | ICD-10-CM | POA: Diagnosis not present

## 2021-05-26 DIAGNOSIS — G8929 Other chronic pain: Secondary | ICD-10-CM | POA: Diagnosis not present

## 2021-05-26 DIAGNOSIS — R634 Abnormal weight loss: Secondary | ICD-10-CM | POA: Diagnosis not present

## 2021-05-26 DIAGNOSIS — C159 Malignant neoplasm of esophagus, unspecified: Secondary | ICD-10-CM | POA: Diagnosis present

## 2021-05-26 DIAGNOSIS — C155 Malignant neoplasm of lower third of esophagus: Secondary | ICD-10-CM | POA: Diagnosis not present

## 2021-05-26 LAB — GLUCOSE, CAPILLARY: Glucose-Capillary: 105 mg/dL — ABNORMAL HIGH (ref 70–99)

## 2021-05-26 MED ORDER — DEXAMETHASONE 2 MG PO TABS: ORAL_TABLET | ORAL | 0 refills | Status: DC

## 2021-05-26 MED ORDER — FLUDEOXYGLUCOSE F - 18 (FDG) INJECTION
4.9700 | Freq: Once | INTRAVENOUS | Status: AC
  Administered 2021-05-26: 4.97 via INTRAVENOUS

## 2021-05-26 MED ORDER — PROCHLORPERAZINE MALEATE 5 MG PO TABS: 5.0000 mg | ORAL_TABLET | Freq: Four times a day (QID) | ORAL | 0 refills | Status: DC | PRN

## 2021-05-26 NOTE — Progress Notes (Signed)
START ON PATHWAY REGIMEN - Gastroesophageal     Administer weekly during RT:     Paclitaxel      Carboplatin   **Always confirm dose/schedule in your pharmacy ordering system**  Patient Characteristics: Esophageal & GE Junction, Adenocarcinoma, Preoperative or Nonsurgical Candidate (Clinical Staging), cT2 or Higher or cN+, Surgical Candidate (Up to cT4a) - Preoperative Therapy, Esophageal Histology: Adenocarcinoma Disease Classification: Esophageal Therapeutic Status: Preoperative or Nonsurgical Candidate (Clinical Staging) AJCC Grade: Staged < 8th Ed. AJCC 8 Stage Grouping: Staged < 8th Ed. AJCC T Category: Staged < 8th Ed. AJCC N Category: Staged < 8th Ed. AJCC M Category: Staged < 8th Ed. Intent of Therapy: Curative Intent, Discussed with Patient

## 2021-05-26 NOTE — Progress Notes (Signed)
  [place] OFFICE PROGRESS NOTE   Diagnosis: Esophagus cancer  INTERVAL HISTORY:   Lee Wilson returns as scheduled.  He reports continued dysphagia and pain.  Pain medication is partially effective.  He vomits periodically.  He is tolerating the nutritional supplements.  Reports he had cream potatoes yesterday.  Objective:  Vital signs in last 24 hours:  Blood pressure 131/71, pulse 88, temperature 98.1 F (36.7 C), temperature source Oral, resp. rate 20, height '5\' 3"'$  (1.6 m), weight 97 lb 3.2 oz (44.1 kg), SpO2 96 %.    Resp: Lungs clear bilaterally. Cardio: Regular rate and rhythm. GI: Abdomen soft and nontender.  No hepatosplenomegaly. Vascular: No leg edema. Neuro: Alert and oriented.   Lab Results:  Lab Results  Component Value Date   WBC 6.5 05/15/2021   HGB 13.8 05/15/2021   HCT 40.8 05/15/2021   MCV 97.8 05/15/2021   PLT 304 05/15/2021   NEUTROABS 3,484 05/15/2021    Imaging:  No results found.  Medications: I have reviewed the patient's current medications.  Assessment/Plan: Distal esophagus mass Mass at 30 cm on EGD 05/08/2021-biopsy negative for malignancy CT abdomen/pelvis 04/24/2021-masslike thickening of the distal esophagus, T12 compression fracture-age-indeterminate PET scan 05/26/2021-final report pending   Solid/liquid dysphagia secondary to #1 Chronic back pain T12 compression fracture-likely benign History of heavy alcohol use Remote history of tobacco use History of gastroesophageal reflux disease Barrett's esophagus on EGD 05/08/2021 Weight loss secondary to #1  Disposition: Lee Wilson appears unchanged.  He had a PET scan earlier today which has not been read.  Our review indicates disease localized to the esophagus.  He has radiation simulation 05/30/2021.  He is scheduled for a repeat endoscopy/biopsy 05/31/2021.  Plan is to give weekly Taxol/carboplatin during the course of radiation.  We reviewed potential toxicities  associated with chemotherapy including bone marrow toxicity, nausea, mouth sores, diarrhea or constipation, hair loss.  We discussed the potential for allergic reaction with either drug.  He understands the rationale for dexamethasone premedication.  We discussed the neuropathy associated with Taxol.  He agrees to proceed.  He will return on 06/07/2021 for chemotherapy education, follow-up and the first cycle of Taxol/carboplatin.  We will also schedule a follow-up appointment with the dietitian that day.  At Lee Wilson request I reviewed the above with his friend Stanton Kidney who is helping with his transportation.  Patient seen with Dr. Benay Spice.  Ned Card ANP/GNP-BC   05/26/2021  12:22 PM  This was a shared visit with Ned Card.  We reviewed the PET images with Lee Wilson.  There is a hypermetabolic esophagus mass.  No apparent evidence of metastatic disease (final report pending).  He is tolerating liquids and declines a feeding tube.  He will undergo a repeat biopsy and radiation simulation next week.  The plan is to initiate concurrent chemotherapy and radiation during the week of 06/05/2021 if the biopsy confirms esophagus carcinoma.  I recommend weekly Taxol/carboplatin and concurrent radiation.  We reviewed potential toxicities associated with the Taxol/carboplatin therapy regimen.  He agrees to proceed.  We will obtain a chemotherapy teaching class.  A chemotherapy plan was entered today.  I was present for greater than 50% of today's visit.  I performed medical decision making.  Julieanne Manson, MD

## 2021-05-30 ENCOUNTER — Other Ambulatory Visit: Payer: Self-pay

## 2021-05-30 ENCOUNTER — Ambulatory Visit
Admission: RE | Admit: 2021-05-30 | Discharge: 2021-05-30 | Disposition: A | Discharge status: Home / Self Care | Payer: Medicare Other | Source: Ambulatory Visit | Attending: Radiation Oncology | Admitting: Radiation Oncology

## 2021-05-30 DIAGNOSIS — C155 Malignant neoplasm of lower third of esophagus: Secondary | ICD-10-CM | POA: Diagnosis not present

## 2021-05-30 DIAGNOSIS — Z510 Encounter for antineoplastic radiation therapy: Secondary | ICD-10-CM | POA: Diagnosis present

## 2021-05-31 ENCOUNTER — Other Ambulatory Visit: Payer: Self-pay

## 2021-05-31 NOTE — Progress Notes (Signed)
The proposed treatment discussed in conference is for discussion purpose only and is not a binding recommendation.  The patients have not been physically examined, or presented with their treatment options.  Therefore, final treatment plans cannot be decided.  

## 2021-06-01 ENCOUNTER — Ambulatory Visit: Payer: Medicare Other | Admitting: Radiation Oncology

## 2021-06-04 ENCOUNTER — Other Ambulatory Visit: Payer: Self-pay | Admitting: Oncology

## 2021-06-05 ENCOUNTER — Telehealth: Payer: Self-pay | Admitting: *Deleted

## 2021-06-05 ENCOUNTER — Ambulatory Visit
Admission: RE | Admit: 2021-06-05 | Discharge: 2021-06-05 | Disposition: A | Discharge status: Home / Self Care | Payer: Medicare Other | Source: Ambulatory Visit | Attending: Radiation Oncology | Admitting: Radiation Oncology

## 2021-06-05 DIAGNOSIS — Z510 Encounter for antineoplastic radiation therapy: Secondary | ICD-10-CM | POA: Diagnosis not present

## 2021-06-05 NOTE — Telephone Encounter (Signed)
Left VM asking if repeat EGD w/biopsy has been performed yet (due August). Requested to fax results to [phone] or call office w/date of upcoming procedure.

## 2021-06-06 ENCOUNTER — Telehealth: Payer: Self-pay | Admitting: *Deleted

## 2021-06-06 ENCOUNTER — Ambulatory Visit
Admission: RE | Admit: 2021-06-06 | Discharge: 2021-06-06 | Disposition: A | Discharge status: Home / Self Care | Payer: Medicare Other | Source: Ambulatory Visit | Attending: Radiation Oncology | Admitting: Radiation Oncology

## 2021-06-06 ENCOUNTER — Other Ambulatory Visit: Payer: Self-pay

## 2021-06-06 DIAGNOSIS — Z510 Encounter for antineoplastic radiation therapy: Secondary | ICD-10-CM | POA: Diagnosis not present

## 2021-06-06 NOTE — Progress Notes (Signed)
Pharmacist Chemotherapy Monitoring - Initial Assessment    Anticipated start date: 06/07/21   The following has been reviewed per standard work regarding the patient's treatment regimen: The patient's diagnosis, treatment plan and drug doses, and organ/hematologic function Lab orders and baseline tests specific to treatment regimen  The treatment plan start date, drug sequencing, and pre-medications Prior authorization status  Patient's documented medication list, including drug-drug interaction screen and prescriptions for anti-emetics and supportive care specific to the treatment regimen The drug concentrations, fluid compatibility, administration routes, and timing of the medications to be used The patient's access for treatment and lifetime cumulative dose history, if applicable  The patient's medication allergies and previous infusion related reactions, if applicable   Changes made to treatment plan:  N/A  Follow up needed:  N/A   Lee Wilson, [facility], 06/06/2021  8:38 AM

## 2021-06-06 NOTE — Telephone Encounter (Signed)
Called patient to remind him to take his 10 mg dexamethasone at 10 pm tonight and at 6 am tomorrow. He understands and agrees.

## 2021-06-07 ENCOUNTER — Inpatient Hospital Stay: Payer: Medicare Other

## 2021-06-07 ENCOUNTER — Encounter: Payer: Self-pay | Admitting: Nurse Practitioner

## 2021-06-07 ENCOUNTER — Ambulatory Visit: Payer: Medicare Other

## 2021-06-07 ENCOUNTER — Inpatient Hospital Stay (HOSPITAL_BASED_OUTPATIENT_CLINIC_OR_DEPARTMENT_OTHER): Payer: Medicare Other | Admitting: Nurse Practitioner

## 2021-06-07 ENCOUNTER — Inpatient Hospital Stay: Payer: Medicare Other | Admitting: Nutrition

## 2021-06-07 VITALS — BP 109/63 | HR 92 | Temp 98.2°F | Resp 19 | Ht 63.0 in | Wt 95.6 lb

## 2021-06-07 DIAGNOSIS — K2289 Other specified disease of esophagus: Secondary | ICD-10-CM

## 2021-06-07 DIAGNOSIS — C159 Malignant neoplasm of esophagus, unspecified: Secondary | ICD-10-CM | POA: Diagnosis not present

## 2021-06-07 LAB — CMP (CANCER CENTER ONLY)
ALT: 9 U/L (ref 0–44)
AST: 16 U/L (ref 15–41)
Albumin: 3.5 g/dL (ref 3.5–5.0)
Alkaline Phosphatase: 46 U/L (ref 38–126)
Anion gap: 13 (ref 5–15)
BUN: 20 mg/dL (ref 8–23)
CO2: 27 mmol/L (ref 22–32)
Calcium: 9.3 mg/dL (ref 8.9–10.3)
Chloride: 95 mmol/L — ABNORMAL LOW (ref 98–111)
Creatinine: 1 mg/dL (ref 0.61–1.24)
GFR, Estimated: >60 mL/min (ref >60)
Glucose, Bld: 206 mg/dL — ABNORMAL HIGH (ref 70–99)
Potassium: 3.5 mmol/L (ref 3.5–5.1)
Sodium: 135 mmol/L (ref 135–145)
Total Bilirubin: 0.7 mg/dL (ref 0.3–1.2)
Total Protein: 7.5 g/dL (ref 6.5–8.1)

## 2021-06-07 LAB — CBC WITH DIFFERENTIAL (CANCER CENTER ONLY)
Abs Immature Granulocytes: 0.01 10*3/uL (ref 0.00–0.07)
Basophils Absolute: 0 10*3/uL (ref 0.0–0.1)
Basophils Relative: 0 %
Eosinophils Absolute: 0 10*3/uL (ref 0.0–0.5)
Eosinophils Relative: 0 %
HCT: 36.5 % — ABNORMAL LOW (ref 39.0–52.0)
Hemoglobin: 12.5 g/dL — ABNORMAL LOW (ref 13.0–17.0)
Immature Granulocytes: 0 %
Lymphocytes Relative: 14 %
Lymphs Abs: 0.5 10*3/uL — ABNORMAL LOW (ref 0.7–4.0)
MCH: 32.5 pg (ref 26.0–34.0)
MCHC: 34.2 g/dL (ref 30.0–36.0)
MCV: 94.8 fL (ref 80.0–100.0)
Monocytes Absolute: 0 10*3/uL — ABNORMAL LOW (ref 0.1–1.0)
Monocytes Relative: 1 %
Neutro Abs: 2.8 10*3/uL (ref 1.7–7.7)
Neutrophils Relative %: 85 %
Platelet Count: 283 10*3/uL (ref 150–400)
RBC: 3.85 MIL/uL — ABNORMAL LOW (ref 4.22–5.81)
RDW: 12.4 % (ref 11.5–15.5)
WBC Count: 3.3 10*3/uL — ABNORMAL LOW (ref 4.0–10.5)
nRBC: 0 % (ref 0.0–0.2)

## 2021-06-07 MED ORDER — LANSOPRAZOLE 30 MG PO CPDR: 30.0000 mg | DELAYED_RELEASE_CAPSULE | Freq: Every day | ORAL | 3 refills | Status: DC

## 2021-06-07 NOTE — Progress Notes (Signed)
Nutrition follow up for Esophagus Cancer completed with patient and friend in exam room.   Weight documented as 95.6 pounds. This is decreased from 97.2 pounds on August 3. This is additional 2% over one week.  Labs include glucose of 206 on dexamethasone.   Plan is for Carbo/Taxol and radiation.  Patient denies Nausea, Vomiting, Constipation and Diarrhea. Reports last BM was "a couple days ago." He hasn't "found" Miralax yet. He has continued dysphagia/odynophagia. He is trying to avoid acid containing foods due to his reflux. He reports he ate 1/4 of a grilled cheese sandwich twice yesterday for a total of 1/2 sandwich. He drank 1 carton of Ensure Plus. The most Ensure plus he can drink is 1 1/2 cartons. States he has 3-4 cases of ensure plus available at home from his neighbor and the cancer center.  Estimated Nutrition Needs:1450 - 1650 kcal, 60-70 gm protein, 1.7 L fluid.  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   Intervention: Reviewed importance of increasing small amounts of food and liquids with calories throughout the day. Recommended increase Ensure Plus to 3 cartons daily, drinking 1/2 carton every 3 hours. Suggested pureed foods/baby foods and full liquids. Continue bowel regimen. Explained importance of minimizing constipation to help appetite.  Monitoring, Evaluation, Goals: Patient will tolerate increased calories and protein to minimize further weight loss.   Next Visit: Wednesday, August 31, during infusion.

## 2021-06-07 NOTE — Progress Notes (Deleted)
Subjective:    Patient ID: Lee Wilson, male    DOB: [birth year], 75 y.o.   MRN: [id number]  HPI: Lee Wilson is a 75 y.o. male presenting for follow up.  No chief complaint on file.  Esophageal mass -   CAD - simvastatin  Allergies  Allergen Reactions   Penicillin G Other (See Comments)    Outpatient Encounter Medications as of 06/08/2021  Medication Sig   albuterol (VENTOLIN HFA) 108 (90 Base) MCG/ACT inhaler Inhale 2 puffs into the lungs every 4 (four) hours as needed for wheezing or shortness of breath.   cetirizine (ZYRTEC) 10 MG tablet Take 1 tablet (10 mg total) by mouth daily.   dexamethasone (DECADRON) 2 MG tablet Take 10 mg (5 tabs) at 10pm night prior to first chemo and 6am morning of first chemo   diclofenac Sodium (VOLTAREN) 1 % GEL Apply to back three times as needed for pain   gatifloxacin (ZYMAXID) 0.5 % SOLN INSTILL 1 DROP IN RIGHT EYE FOUR TIMES DAILY AS DIRECTED   HYDROcodone-acetaminophen (HYCET) 7.5-325 mg/15 ml solution Take 10 mLs by mouth every 6 (six) hours as needed for moderate pain.   hydrocortisone 2.5 % cream Apply topically 2 (two) times daily. Apply to face   lansoprazole (PREVACID) 30 MG capsule Take 1 capsule (30 mg total) by mouth daily at 12 noon. Open capsule and sprinkle in applesauce, pudding or yogurt. Do not crush medication   levothyroxine (SYNTHROID) 88 MCG tablet Take 1 tablet (88 mcg total) by mouth daily before breakfast.   lidocaine (LIDODERM) 5 % Place 1 patch onto the skin daily. Remove & Discard patch within 12 hours or as directed by MD (Patient not taking: No sig reported)   prochlorperazine (COMPAZINE) 5 MG tablet Take 1 tablet (5 mg total) by mouth every 6 (six) hours as needed for nausea or vomiting.   simvastatin (ZOCOR) 80 MG tablet Take 1 tablet (80 mg total) by mouth at bedtime.   Tiotropium Bromide Monohydrate (SPIRIVA RESPIMAT) 2.5 MCG/ACT AERS Inhale 2 puffs into the lungs daily.   No facility-administered  encounter medications on file as of 06/08/2021.    Patient Active Problem List   Diagnosis Date Noted   Esophageal mass 05/15/2021   Chest pain 05/15/2021   Rash 02/16/2021   Macrocytic anemia 01/04/2021   DDD (degenerative disc disease), lumbar 12/09/2020   Hypothyroidism 12/01/2018   Alcohol use 12/01/2018   Protein-calorie malnutrition ([place]) 03/25/2018   Carotid artery disease ([place]) 07/25/2016   Cerebrovascular disease or lesion 07/25/2016   Lytic bone lesions on xray 07/25/2016   C6 cervical fracture ([place]) 07/16/2016   C7 cervical fracture ([place]) 07/16/2016   Spleen hematoma 07/16/2016   T1 vertebral fracture ([place]) 07/16/2016   Chronic bronchitis ([place]) 09/22/2013   GERD (gastroesophageal reflux disease) 09/22/2013    Past Medical History:  Diagnosis Date   Asthma    Fall from height of greater Wilson 3 feet 07/16/2016   GERD (gastroesophageal reflux disease)    Leg cramp 01/04/2021    Relevant past medical, surgical, family and social history reviewed and updated as indicated. Interim medical history since our last visit reviewed.  Review of Systems Per HPI unless specifically indicated above     Objective:    There were no vitals taken for this visit.  Wt Readings from Last 3 Encounters:  06/07/21 95 lb 9.6 oz (43.4 kg)  05/26/21 97 lb 3.2 oz (44.1 kg)  05/24/21 97 lb 4 oz (44.1  kg)    Physical Exam  Results for orders placed or performed in visit on 06/07/21  CMP ([place] only)  Result Value Ref Range   Sodium 135 135 - 145 mmol/L   Potassium 3.5 3.5 - 5.1 mmol/L   Chloride 95 (L) 98 - 111 mmol/L   CO2 27 22 - 32 mmol/L   Glucose, Bld 206 (H) 70 - 99 mg/dL   BUN 20 8 - 23 mg/dL   Creatinine 1.00 0.61 - 1.24 mg/dL   Calcium 9.3 8.9 - 10.3 mg/dL   Total Protein 7.5 6.5 - 8.1 g/dL   Albumin 3.5 3.5 - 5.0 g/dL   AST 16 15 - 41 U/L   ALT 9 0 - 44 U/L   Alkaline Phosphatase 46 38 - 126 U/L   Total Bilirubin 0.7 0.3 - 1.2 mg/dL   GFR, Estimated >60 >60  mL/min   Anion gap 13 5 - 15  CBC with Differential (Cancer Center Only)  Result Value Ref Range   WBC Count 3.3 (L) 4.0 - 10.5 K/uL   RBC 3.85 (L) 4.22 - 5.81 MIL/uL   Hemoglobin 12.5 (L) 13.0 - 17.0 g/dL   HCT 36.5 (L) 39.0 - 52.0 %   MCV 94.8 80.0 - 100.0 fL   MCH 32.5 26.0 - 34.0 pg   MCHC 34.2 30.0 - 36.0 g/dL   RDW 12.4 11.5 - 15.5 %   Platelet Count 283 150 - 400 K/uL   nRBC 0.0 0.0 - 0.2 %   Neutrophils Relative % 85 %   Neutro Abs 2.8 1.7 - 7.7 K/uL   Lymphocytes Relative 14 %   Lymphs Abs 0.5 (L) 0.7 - 4.0 K/uL   Monocytes Relative 1 %   Monocytes Absolute 0.0 (L) 0.1 - 1.0 K/uL   Eosinophils Relative 0 %   Eosinophils Absolute 0.0 0.0 - 0.5 K/uL   Basophils Relative 0 %   Basophils Absolute 0.0 0.0 - 0.1 K/uL   Immature Granulocytes 0 %   Abs Immature Granulocytes 0.01 0.00 - 0.07 K/uL      Assessment & Plan:   Problem List Items Addressed This Visit   None    Follow up plan: No follow-ups on file.

## 2021-06-07 NOTE — Progress Notes (Signed)
  [facility] OFFICE PROGRESS NOTE   Diagnosis: Esophagus cancer  INTERVAL HISTORY:   Lee Wilson returns as scheduled.  He is accompanied by his friend.  He reports continued dysphagia/odynophagia.  Pain medication has been effective.  He is tolerating nutritional supplements.  He reports a "sour" stomach.  He is having a hard time swallowing Protonix.  Objective:  Vital signs in last 24 hours:  Blood pressure 109/63, pulse 92, temperature 98.2 F (36.8 C), temperature source Oral, resp. rate 19, height _0  (1.6 m), weight 95 lb 9.6 oz (43.4 kg), SpO2 100 %.    Resp: Lungs clear bilaterally. Cardio: Regular rate and rhythm. GI: Abdomen soft and nontender.  No hepatosplenomegaly. Vascular: No leg edema.   Lab Results:  Lab Results  Component Value Date   WBC 3.3 (L) 06/07/2021   HGB 12.5 (L) 06/07/2021   HCT 36.5 (L) 06/07/2021   MCV 94.8 06/07/2021   PLT 283 06/07/2021   NEUTROABS 2.8 06/07/2021    Imaging:  No results found.  Medications: I have reviewed the patient's current medications.  Assessment/Plan: Distal esophagus mass Mass at 30 cm on EGD 05/08/2021-biopsy negative for malignancy CT abdomen/pelvis 04/24/2021-masslike thickening of the distal esophagus, T12 compression fracture-age-indeterminate PET scan 05/26/2021-mid to lower esophageal mass is hypermetabolic.  Small subcarinal lymph node with borderline FDG uptake. Repeat endoscopy/biopsy 05/31/2021-we do not have a copy of the procedure report.  Pathology report has not been finalized.   Solid/liquid dysphagia secondary to #1 Chronic back pain T12 compression fracture-likely benign History of heavy alcohol use Remote history of tobacco use History of gastroesophageal reflux disease Barrett's esophagus on EGD 05/08/2021 Weight loss secondary to #1  Disposition: Lee Wilson appears unchanged.  He had a repeat endoscopy/biopsy 05/31/2021.  We do not have a copy of the procedure report.   Dr. Benay Spice spoke with the pathologist.  His case is being sent out for further review.  We are canceling the chemotherapy scheduled for today.  We contacted radiation oncology to make them aware of the pending pathology report.  He will return for follow-up in 1 week.  Patient seen with Dr. Benay Spice.    Ned Card ANP/GNP-BC   06/07/2021  9:29 AM  This was a shared visit with Ned Card.  I discussed the pathology from the repeat EGD biopsy with the GI pathologist.  The tissue does not reveal an epithelial malignancy.  He is feels the biopsy may represent a lymphoid malignancy.  The case has been sent out to [facility] for a second opinion.  The planned chemotherapy/radiation has been placed on hold.  We discussed the differential diagnosis with Lee Wilson.  He met with a nutritionist today.  Lee Wilson will return for an office visit in 1 week.  I was present for greater than 50% of today's visit.  I performed medical decision making.  Julieanne Manson, MD

## 2021-06-08 ENCOUNTER — Ambulatory Visit: Payer: Medicare Other | Admitting: Nurse Practitioner

## 2021-06-08 ENCOUNTER — Encounter: Payer: Self-pay | Admitting: Oncology

## 2021-06-08 ENCOUNTER — Ambulatory Visit: Payer: Medicare Other

## 2021-06-08 ENCOUNTER — Telehealth: Payer: Self-pay | Admitting: Radiation Oncology

## 2021-06-08 NOTE — Progress Notes (Signed)
Subjective:    Patient ID: Lee Wilson, male    DOB: 03-17-1946, 75 y.o.   MRN: [id number]  HPI: Lee Wilson is a 75 y.o. male presenting for follow-up.   Chief Complaint  Patient presents with   Follow-up    Had labs   Esophageal mass -following closely with GI and oncology.  It sounds like the biopsy results have been inconclusive and have been sent out to [facility] for a second opinion.  Fortunately, the patient has been eating and drinking some liquids and they have been staying down.  HYPOTHYROIDISM Currently taking levothyroxine 88 mcg.  It is unclear how often he is taking this medication and whether or not it is staying down.  He is having significant difficulty swallowing pills right now. Thyroid control status:stable Satisfied with current treatment? yes Medication side effects: no Medication compliance: fair compliance Recent dose adjustment:no Fatigue: no Cold intolerance: no Heat intolerance: no Weight gain: no Weight loss: yes Constipation: no Diarrhea/loose stools: no Palpitations: no Lower extremity edema: no Anxiety/depressed mood: no  CORONARY ARTERY DISEASE History of carotid artery disease.  Maintained on simvastatin 80 mg daily, however unclear how frequently he is taking this medication secondary to his difficulty swallowing because of the esophageal mass.  However, he is not having any chest pain, shortness of breath, swelling in his legs, difficulty breathing. Angina frequency: never Chest pain: no Edema: no Orthopnea: no Paroxysmal nocturnal dyspnea: no Dyspnea on exertion: no Pneumovax:  Up to Date Aspirin: no  Patient is requesting refills of Spiriva and albuterol rescue inhaler today.  He was not taking Spiriva daily previously.  He reports his breathing is stable today.  Allergies  Allergen Reactions   Penicillin G Other (See Comments)    Outpatient Encounter Medications as of 06/09/2021  Medication Sig   cetirizine  (ZYRTEC) 10 MG tablet Take 1 tablet (10 mg total) by mouth daily.   dexamethasone (DECADRON) 2 MG tablet Take 10 mg (5 tabs) at 10pm night prior to first chemo and 6am morning of first chemo   diclofenac Sodium (VOLTAREN) 1 % GEL Apply to back three times as needed for pain   gatifloxacin (ZYMAXID) 0.5 % SOLN INSTILL 1 DROP IN RIGHT EYE FOUR TIMES DAILY AS DIRECTED   HYDROcodone-acetaminophen (HYCET) 7.5-325 mg/15 ml solution Take 10 mLs by mouth every 6 (six) hours as needed for moderate pain.   hydrocortisone 2.5 % cream Apply topically 2 (two) times daily. Apply to face   lansoprazole (PREVACID) 30 MG capsule Take 1 capsule (30 mg total) by mouth daily at 12 noon. Open capsule and sprinkle in applesauce, pudding or yogurt. Do not crush medication   levothyroxine (SYNTHROID) 88 MCG tablet Take 1 tablet (88 mcg total) by mouth daily before breakfast.   lidocaine (LIDODERM) 5 % Place 1 patch onto the skin daily. Remove & Discard patch within 12 hours or as directed by MD   prochlorperazine (COMPAZINE) 5 MG tablet Take 1 tablet (5 mg total) by mouth every 6 (six) hours as needed for nausea or vomiting.   simvastatin (ZOCOR) 80 MG tablet Take 1 tablet (80 mg total) by mouth at bedtime.   [DISCONTINUED] albuterol (VENTOLIN HFA) 108 (90 Base) MCG/ACT inhaler Inhale 2 puffs into the lungs every 4 (four) hours as needed for wheezing or shortness of breath.   [DISCONTINUED] Tiotropium Bromide Monohydrate (SPIRIVA RESPIMAT) 2.5 MCG/ACT AERS Inhale 2 puffs into the lungs daily.   albuterol (VENTOLIN HFA) 108 (90 Base)  MCG/ACT inhaler Inhale 2 puffs into the lungs every 4 (four) hours as needed for wheezing or shortness of breath.   Tiotropium Bromide Monohydrate (SPIRIVA RESPIMAT) 2.5 MCG/ACT AERS Inhale 2 puffs into the lungs daily.   No facility-administered encounter medications on file as of 06/09/2021.    Patient Active Problem List   Diagnosis Date Noted   Esophageal mass 05/15/2021   Chest pain  05/15/2021   Rash 02/16/2021   Macrocytic anemia 01/04/2021   DDD (degenerative disc disease), lumbar 12/09/2020   Hypothyroidism 12/01/2018   Alcohol use 12/01/2018   Protein-calorie malnutrition ([place]) 03/25/2018   Carotid artery disease ([place]) 07/25/2016   Cerebrovascular disease or lesion 07/25/2016   Lytic bone lesions on xray 07/25/2016   C6 cervical fracture ([place]) 07/16/2016   C7 cervical fracture ([place]) 07/16/2016   Spleen hematoma 07/16/2016   T1 vertebral fracture ([place]) 07/16/2016   Chronic bronchitis ([place]) 09/22/2013   GERD (gastroesophageal reflux disease) 09/22/2013    Past Medical History:  Diagnosis Date   Asthma    Fall from height of greater than 3 feet 07/16/2016   GERD (gastroesophageal reflux disease)    Leg cramp 01/04/2021    Relevant past medical, surgical, family and social history reviewed and updated as indicated. Interim medical history since our last visit reviewed.  Review of Systems Per HPI unless specifically indicated above     Objective:    BP 110/68   Pulse 78   Temp 98.3 F (36.8 C) (Temporal)   Resp 18   Ht '5\' 3"'$  (1.6 m)   Wt 95 lb 3.2 oz (43.2 kg)   SpO2 97%   BMI 16.86 kg/m   Wt Readings from Last 3 Encounters:  06/09/21 95 lb 3.2 oz (43.2 kg)  06/07/21 95 lb 9.6 oz (43.4 kg)  05/26/21 97 lb 3.2 oz (44.1 kg)    Physical Exam Vitals and nursing note reviewed.  Constitutional:      General: He is not in acute distress.    Appearance: He is cachectic. He is not ill-appearing or toxic-appearing.  Eyes:     General: No scleral icterus.       Right eye: No discharge.  Neck:     Vascular: No carotid bruit.  Cardiovascular:     Rate and Rhythm: Normal rate and regular rhythm.     Heart sounds: Normal heart sounds. No murmur heard. Pulmonary:     Effort: Pulmonary effort is normal. No respiratory distress.     Breath sounds: Normal breath sounds. No wheezing, rhonchi or rales.  Abdominal:     General: Abdomen is flat. Bowel  sounds are decreased. There is no distension.     Palpations: Abdomen is soft.     Tenderness: There is no abdominal tenderness.  Musculoskeletal:     Cervical back: Normal range of motion.     Right lower leg: No edema.     Left lower leg: No edema.  Lymphadenopathy:     Cervical: No cervical adenopathy.  Skin:    General: Skin is warm and dry.     Capillary Refill: Capillary refill takes less than 2 seconds.     Coloration: Skin is not jaundiced or pale.     Findings: No erythema.  Neurological:     Mental Status: He is oriented to person, place, and time.     Motor: No weakness.     Gait: Gait normal.  Psychiatric:        Mood and Affect: Mood normal.  Behavior: Behavior normal. Behavior is cooperative.        Thought Content: Thought content normal.        Judgment: Judgment normal.      Assessment & Plan:   Problem List Items Addressed This Visit       Cardiovascular and Mediastinum   Carotid artery disease (HCC)    Chronic.  Will be due for repeat bilateral carotid ultrasound next month, however his esophageal mass treatment certainly takes precedence.  Has maintained on simvastatin 80 mg daily-we will plan to continue this as much as he can tolerate it.  We will check lipids today-patient is fasting, although will likely not adjust medication at this time.  Follow-up in 4 months.      Relevant Orders   Lipid panel (Completed)     Respiratory   Chronic bronchitis ([place]) - Primary    Chronic.  Closely he has asthma, I do not see pulmonary function testing results.  We will consider this for the future, especially if Spiriva stops helping.  However, I discussed Spiriva and albuterol rescue inhaler at length with the patient today.  He will start using Spiriva daily and return to clinic if his breathing worsens in the meantime.  Otherwise, follow-up in 4 months.      Relevant Medications   albuterol (VENTOLIN HFA) 108 (90 Base) MCG/ACT inhaler   Tiotropium Bromide  Monohydrate (SPIRIVA RESPIMAT) 2.5 MCG/ACT AERS     Endocrine   Hypothyroidism    Chronic.  Unclear how frequently patient is taking levothyroxine secondary to difficulty swallowing pills with esophageal mass.  We will plan to keep at current dose as long as TSH is not low.  Follow-up 4 months.      Relevant Orders   TSH (Completed)     Other   Esophageal mass    Patient is currently following with GI closely as well as the cancer center.  Results from the biopsy have been inconclusive so far-second opinion has been sent out to academic center.  Continue collaboration with specialists.        Follow up plan: Return in about 4 months (around 10/09/2021) for follow up.

## 2021-06-08 NOTE — Telephone Encounter (Signed)
I called the patient to let him know that we had been discussing his case with pathology.  Dr. Rondel Oh and I have talked to Dr. Donato Heinz at [facility] and the pathology department, he has sent off the slides to [facility] for second opinion but unfortunately does not feel like there is any tissue findings to even call enough to even call lymphoma.  As a result we will continue to hold his treatment until we have further discuss question about next steps and final opinion from [facility].  We have asked for his case to be added again to the GI oncology conference discussion list for next week.

## 2021-06-09 ENCOUNTER — Ambulatory Visit: Payer: Medicare Other

## 2021-06-09 ENCOUNTER — Other Ambulatory Visit: Payer: Self-pay

## 2021-06-09 ENCOUNTER — Encounter: Payer: Self-pay | Admitting: Nurse Practitioner

## 2021-06-09 ENCOUNTER — Ambulatory Visit (INDEPENDENT_AMBULATORY_CARE_PROVIDER_SITE_OTHER): Payer: Medicare Other | Admitting: Nurse Practitioner

## 2021-06-09 VITALS — BP 110/68 | HR 78 | Temp 98.3°F | Resp 18 | Ht 63.0 in | Wt 95.2 lb

## 2021-06-09 DIAGNOSIS — J410 Simple chronic bronchitis: Secondary | ICD-10-CM | POA: Diagnosis not present

## 2021-06-09 DIAGNOSIS — K2289 Other specified disease of esophagus: Secondary | ICD-10-CM | POA: Diagnosis not present

## 2021-06-09 DIAGNOSIS — I6521 Occlusion and stenosis of right carotid artery: Secondary | ICD-10-CM

## 2021-06-09 DIAGNOSIS — E039 Hypothyroidism, unspecified: Secondary | ICD-10-CM | POA: Diagnosis not present

## 2021-06-09 LAB — LIPID PANEL
Cholesterol: 212 mg/dL — ABNORMAL HIGH (ref <200)
HDL: 45 mg/dL (ref >=40)
LDL Cholesterol (Calc): 136 mg/dL (calc) — ABNORMAL HIGH
Non-HDL Cholesterol (Calc): 167 mg/dL (calc) — ABNORMAL HIGH (ref <130)
Total CHOL/HDL Ratio: 4.7 (calc) (ref <5.0)
Triglycerides: 168 mg/dL — ABNORMAL HIGH (ref <150)

## 2021-06-09 LAB — TSH: TSH: 4.93 mIU/L — ABNORMAL HIGH (ref 0.40–4.50)

## 2021-06-09 MED ORDER — ALBUTEROL SULFATE HFA 108 (90 BASE) MCG/ACT IN AERS: 2.0000 | INHALATION_SPRAY | RESPIRATORY_TRACT | 2 refills | Status: DC | PRN

## 2021-06-09 MED ORDER — SPIRIVA RESPIMAT 2.5 MCG/ACT IN AERS: 2.0000 | INHALATION_SPRAY | Freq: Every day | RESPIRATORY_TRACT | 11 refills | Status: DC

## 2021-06-11 NOTE — Assessment & Plan Note (Addendum)
Chronic.  Will be due for repeat bilateral carotid ultrasound next month, however his esophageal mass treatment certainly takes precedence.  Has maintained on simvastatin 80 mg daily-we will plan to continue this as much as he can tolerate it.  We will check lipids today-patient is fasting, although will likely not adjust medication at this time.  Follow-up in 4 months.

## 2021-06-11 NOTE — Assessment & Plan Note (Addendum)
Chronic.  Closely he has asthma, I do not see pulmonary function testing results.  We will consider this for the future, especially if Spiriva stops helping.  However, I discussed Spiriva and albuterol rescue inhaler at length with the patient today.  He will start using Spiriva daily and return to clinic if his breathing worsens in the meantime.  Otherwise, follow-up in 4 months.

## 2021-06-11 NOTE — Assessment & Plan Note (Signed)
Patient is currently following with GI closely as well as the cancer center.  Results from the biopsy have been inconclusive so far-second opinion has been sent out to academic center.  Continue collaboration with specialists.

## 2021-06-11 NOTE — Assessment & Plan Note (Addendum)
Chronic.  Unclear how frequently patient is taking levothyroxine secondary to difficulty swallowing pills with esophageal mass.  We will plan to keep at current dose as long as TSH is not low.  Follow-up 4 months.

## 2021-06-12 ENCOUNTER — Ambulatory Visit: Payer: Medicare Other

## 2021-06-13 ENCOUNTER — Ambulatory Visit: Payer: Medicare Other

## 2021-06-14 ENCOUNTER — Inpatient Hospital Stay: Payer: Medicare Other

## 2021-06-14 ENCOUNTER — Ambulatory Visit: Payer: Medicare Other

## 2021-06-14 ENCOUNTER — Encounter: Payer: Self-pay | Admitting: *Deleted

## 2021-06-14 ENCOUNTER — Inpatient Hospital Stay: Payer: Medicare Other | Admitting: Nutrition

## 2021-06-14 ENCOUNTER — Inpatient Hospital Stay: Payer: Medicare Other | Admitting: Nurse Practitioner

## 2021-06-14 NOTE — Progress Notes (Signed)
Call to [facility] [phone]) and was informed pathology report from 05/31/21 procedure is not back yet. Case was sent to [facility] for review. Was informed by Dr. Leanna Sato assistant that he will call [facility] today to follow up.

## 2021-06-15 ENCOUNTER — Telehealth: Payer: Self-pay | Admitting: *Deleted

## 2021-06-15 ENCOUNTER — Ambulatory Visit: Payer: Medicare Other

## 2021-06-15 NOTE — Telephone Encounter (Signed)
Received fax requesting alternative to Spiriva.   Insurance prefers [employer].

## 2021-06-16 ENCOUNTER — Ambulatory Visit: Payer: Medicare Other

## 2021-06-16 NOTE — Progress Notes (Signed)
Called [facility] to f/u on pathology from 05/31/21 case. Informed that they have been calling [facility] for ETA on the report without a response. Last call was today.

## 2021-06-20 ENCOUNTER — Ambulatory Visit: Payer: Medicare Other

## 2021-06-20 NOTE — Progress Notes (Signed)
Pharmacist Chemotherapy Monitoring - Initial Assessment    Anticipated start date: 06/21/21   The following has been reviewed per standard work regarding the patient's treatment regimen: The patient's diagnosis, treatment plan and drug doses, and organ/hematologic function Lab orders and baseline tests specific to treatment regimen  The treatment plan start date, drug sequencing, and pre-medications Prior authorization status  Patient's documented medication list, including drug-drug interaction screen and prescriptions for anti-emetics and supportive care specific to the treatment regimen The drug concentrations, fluid compatibility, administration routes, and timing of the medications to be used The patient's access for treatment and lifetime cumulative dose history, if applicable  The patient's medication allergies and previous infusion related reactions, if applicable   Changes made to treatment plan:  treatment plan date  Follow up needed:  N/A   Patrica Duel, [facility], 06/20/2021  11:37 AM

## 2021-06-21 ENCOUNTER — Inpatient Hospital Stay: Payer: Medicare Other

## 2021-06-21 ENCOUNTER — Inpatient Hospital Stay: Payer: Medicare Other | Admitting: Nutrition

## 2021-06-21 ENCOUNTER — Inpatient Hospital Stay: Payer: Medicare Other | Admitting: Nurse Practitioner

## 2021-06-21 ENCOUNTER — Telehealth: Payer: Self-pay | Admitting: Radiation Oncology

## 2021-06-21 ENCOUNTER — Ambulatory Visit: Payer: Medicare Other

## 2021-06-21 MED ORDER — UMECLIDINIUM BROMIDE 62.5 MCG/INH IN AEPB: 1.0000 | INHALATION_SPRAY | Freq: Every day | RESPIRATORY_TRACT | 3 refills | Status: DC

## 2021-06-21 NOTE — Telephone Encounter (Signed)
Prescription sent to pharmacy.   Call placed to patient. No answer. No VM.

## 2021-06-21 NOTE — Telephone Encounter (Signed)
If that is the only alternative they will cover, we can try it.  If they cover Symbicort, I would prefer that instead.

## 2021-06-21 NOTE — Telephone Encounter (Signed)
I called the patient this afternoon to follow-up on the conversations that his providers have been having.  We have been in close contact with pathology.  So far his pathology still does not confirm the suspected clinical diagnosis of esophageal cancer.  I reviewed that while tissue confirmation would help definitively allow [place] to move forward with offering him treatment, Dr. Lisbeth Renshaw does also have concerns and any further delays in his care as the patient's weight has steadily declined.  The patient tells me this evening that he has been able to tolerate drinking 1 boost or protein style drink per day but any more than this causes constipation.  I encouraged him to try and increase this to at least 2 if he is able to still eat regular foods but to also supplement oral cathartics to try and keep his bowels moving regularly.  We discussed what he is currently eating, he describes being able to tolerate small bites of apple, potato chips, and a gravy biscuit.  I suggested that he try and eat soft foods like eggs which he also agrees he has had the ability to tolerate, yogurts, milkshakes, and soups.  We reviewed the concerns about him becoming dehydrated and subsequently risks of renal failure if he continues to be malnourished to the point of metabolic dysfunction.  He assures me that he is able to still drink liquids, and is not having any tea colored urine.  It would be recommended to attempt another biopsy and Dr. Benay Spice recommends proceeding with endoscopic ultrasound to have better imaging in order to sample more tissue.  We reviewed his case with Dr. Paulita Fujita who was out of town for the next week and a half, he recommended that we contact [facility] to speak with Dr. Rush Landmark or Dr. Ardis Hughs.  Dr. Ardis Hughs is out of the office this week but Dr. Rush Landmark was able to review the patient's case and could perform endoscopic ultrasound and evaluation of the patient next week in the inpatient setting.  We will discuss  further with Dr. Benay Spice regarding possible inpatient admission for this work-up.  We do not have an admitting service but would be happy to consult during this time.  He will likely need restimulation given the time since his original treatment planning.

## 2021-06-21 NOTE — Addendum Note (Signed)
Addended by: Sheral Flow on: 06/21/2021 12:09 PM   Modules accepted: Orders

## 2021-06-22 ENCOUNTER — Ambulatory Visit: Payer: Medicare Other

## 2021-06-22 NOTE — Telephone Encounter (Signed)
Please keep me up-to-date with whether patient does come in for hospitalization.  If he does he will need to be consulted by the [facility] GI group initially but they can reach out to me (next week when I am on service and I would be happy to be available for repeat EGD/EUS).  If patient is not going to be hospitalized, please reach back out to me to see if any outpatient availability has developed in the next few weeks since Dr. Paulita Fujita is not available. Thanks. GM

## 2021-06-23 ENCOUNTER — Ambulatory Visit: Payer: Medicare Other

## 2021-06-23 ENCOUNTER — Telehealth: Payer: Self-pay | Admitting: Nurse Practitioner

## 2021-06-23 NOTE — Telephone Encounter (Signed)
I notified Mr. Hausser the recent biopsy was nondiagnostic.  He understands we are making arrangements for admission 06/26/2021 to proceed with EUS.  He agrees with this plan.

## 2021-06-26 ENCOUNTER — Inpatient Hospital Stay (HOSPITAL_COMMUNITY)
Admission: AD | Admit: 2021-06-26 | Discharge: 2021-06-29 | DRG: 380 | Disposition: A | Discharge status: Home / Self Care | Payer: Medicare Other | Attending: Oncology | Admitting: Oncology

## 2021-06-26 ENCOUNTER — Ambulatory Visit: Payer: Medicare Other

## 2021-06-26 ENCOUNTER — Telehealth: Payer: Self-pay | Admitting: *Deleted

## 2021-06-26 ENCOUNTER — Encounter (HOSPITAL_COMMUNITY): Payer: Self-pay | Admitting: Oncology

## 2021-06-26 ENCOUNTER — Other Ambulatory Visit: Payer: Self-pay

## 2021-06-26 DIAGNOSIS — K2210 Ulcer of esophagus without bleeding: Secondary | ICD-10-CM | POA: Diagnosis present

## 2021-06-26 DIAGNOSIS — K219 Gastro-esophageal reflux disease without esophagitis: Secondary | ICD-10-CM | POA: Diagnosis present

## 2021-06-26 DIAGNOSIS — R1110 Vomiting, unspecified: Secondary | ICD-10-CM

## 2021-06-26 DIAGNOSIS — Z79899 Other long term (current) drug therapy: Secondary | ICD-10-CM

## 2021-06-26 DIAGNOSIS — Z20822 Contact with and (suspected) exposure to covid-19: Secondary | ICD-10-CM | POA: Diagnosis present

## 2021-06-26 DIAGNOSIS — Z87891 Personal history of nicotine dependence: Secondary | ICD-10-CM

## 2021-06-26 DIAGNOSIS — K449 Diaphragmatic hernia without obstruction or gangrene: Secondary | ICD-10-CM | POA: Diagnosis present

## 2021-06-26 DIAGNOSIS — M549 Dorsalgia, unspecified: Secondary | ICD-10-CM | POA: Diagnosis present

## 2021-06-26 DIAGNOSIS — D509 Iron deficiency anemia, unspecified: Secondary | ICD-10-CM | POA: Diagnosis not present

## 2021-06-26 DIAGNOSIS — E039 Hypothyroidism, unspecified: Secondary | ICD-10-CM | POA: Diagnosis present

## 2021-06-26 DIAGNOSIS — E43 Unspecified severe protein-calorie malnutrition: Secondary | ICD-10-CM | POA: Diagnosis present

## 2021-06-26 DIAGNOSIS — R599 Enlarged lymph nodes, unspecified: Secondary | ICD-10-CM | POA: Diagnosis present

## 2021-06-26 DIAGNOSIS — R0789 Other chest pain: Secondary | ICD-10-CM

## 2021-06-26 DIAGNOSIS — Z681 Body mass index (BMI) 19 or less, adult: Secondary | ICD-10-CM | POA: Diagnosis not present

## 2021-06-26 DIAGNOSIS — Z23 Encounter for immunization: Secondary | ICD-10-CM

## 2021-06-26 DIAGNOSIS — K2289 Other specified disease of esophagus: Secondary | ICD-10-CM | POA: Diagnosis present

## 2021-06-26 DIAGNOSIS — R634 Abnormal weight loss: Secondary | ICD-10-CM | POA: Diagnosis present

## 2021-06-26 DIAGNOSIS — M4854XA Collapsed vertebra, not elsewhere classified, thoracic region, initial encounter for fracture: Secondary | ICD-10-CM | POA: Diagnosis present

## 2021-06-26 DIAGNOSIS — R1319 Other dysphagia: Secondary | ICD-10-CM

## 2021-06-26 DIAGNOSIS — K229 Disease of esophagus, unspecified: Secondary | ICD-10-CM | POA: Diagnosis not present

## 2021-06-26 DIAGNOSIS — E860 Dehydration: Secondary | ICD-10-CM | POA: Diagnosis present

## 2021-06-26 DIAGNOSIS — Z880 Allergy status to penicillin: Secondary | ICD-10-CM

## 2021-06-26 DIAGNOSIS — K3189 Other diseases of stomach and duodenum: Secondary | ICD-10-CM | POA: Diagnosis not present

## 2021-06-26 DIAGNOSIS — M545 Low back pain, unspecified: Secondary | ICD-10-CM | POA: Diagnosis not present

## 2021-06-26 DIAGNOSIS — G8929 Other chronic pain: Secondary | ICD-10-CM | POA: Diagnosis present

## 2021-06-26 DIAGNOSIS — J449 Chronic obstructive pulmonary disease, unspecified: Secondary | ICD-10-CM | POA: Diagnosis present

## 2021-06-26 DIAGNOSIS — C159 Malignant neoplasm of esophagus, unspecified: Secondary | ICD-10-CM | POA: Diagnosis not present

## 2021-06-26 DIAGNOSIS — F1011 Alcohol abuse, in remission: Secondary | ICD-10-CM

## 2021-06-26 DIAGNOSIS — R1310 Dysphagia, unspecified: Secondary | ICD-10-CM

## 2021-06-26 LAB — CBC WITH DIFFERENTIAL/PLATELET
Abs Immature Granulocytes: 0.01 10*3/uL (ref 0.00–0.07)
Basophils Absolute: 0 10*3/uL (ref 0.0–0.1)
Basophils Relative: 1 %
Eosinophils Absolute: 0.1 10*3/uL (ref 0.0–0.5)
Eosinophils Relative: 1 %
HCT: 33.3 % — ABNORMAL LOW (ref 39.0–52.0)
Hemoglobin: 11.6 g/dL — ABNORMAL LOW (ref 13.0–17.0)
Immature Granulocytes: 0 %
Lymphocytes Relative: 25 %
Lymphs Abs: 1.5 10*3/uL (ref 0.7–4.0)
MCH: 33.6 pg (ref 26.0–34.0)
MCHC: 34.8 g/dL (ref 30.0–36.0)
MCV: 96.5 fL (ref 80.0–100.0)
Monocytes Absolute: 0.5 10*3/uL (ref 0.1–1.0)
Monocytes Relative: 8 %
Neutro Abs: 3.9 10*3/uL (ref 1.7–7.7)
Neutrophils Relative %: 65 %
Platelets: 185 10*3/uL (ref 150–400)
RBC: 3.45 MIL/uL — ABNORMAL LOW (ref 4.22–5.81)
RDW: 14.4 % (ref 11.5–15.5)
WBC: 6 10*3/uL (ref 4.0–10.5)
nRBC: 0 % (ref 0.0–0.2)

## 2021-06-26 LAB — COMPREHENSIVE METABOLIC PANEL
ALT: 9 U/L (ref 0–44)
AST: 18 U/L (ref 15–41)
Albumin: 3.5 g/dL (ref 3.5–5.0)
Alkaline Phosphatase: 47 U/L (ref 38–126)
Anion gap: 10 (ref 5–15)
BUN: 12 mg/dL (ref 8–23)
CO2: 27 mmol/L (ref 22–32)
Calcium: 9.2 mg/dL (ref 8.9–10.3)
Chloride: 103 mmol/L (ref 98–111)
Creatinine, Ser: 0.86 mg/dL (ref 0.61–1.24)
GFR, Estimated: >60 mL/min (ref >60)
Glucose, Bld: 95 mg/dL (ref 70–99)
Potassium: 3.8 mmol/L (ref 3.5–5.1)
Sodium: 140 mmol/L (ref 135–145)
Total Bilirubin: 0.7 mg/dL (ref 0.3–1.2)
Total Protein: 6.9 g/dL (ref 6.5–8.1)

## 2021-06-26 LAB — PROTIME-INR
INR: 1 (ref 0.8–1.2)
Prothrombin Time: 12.8 seconds (ref 11.4–15.2)

## 2021-06-26 LAB — APTT: aPTT: 26 seconds (ref 24–36)

## 2021-06-26 LAB — SARS CORONAVIRUS 2 (TAT 6-24 HRS): SARS Coronavirus 2: NEGATIVE

## 2021-06-26 MED ORDER — ATORVASTATIN CALCIUM 40 MG PO TABS: 40.0000 mg | ORAL_TABLET | Freq: Every day | ORAL | Status: DC

## 2021-06-26 MED ORDER — ENOXAPARIN SODIUM 40 MG/0.4ML IJ SOSY
40.0000 mg | PREFILLED_SYRINGE | INTRAMUSCULAR | Status: DC
Start: 2021-06-26 — End: 2021-06-26

## 2021-06-26 MED ORDER — PROCHLORPERAZINE MALEATE 10 MG PO TABS
5.0000 mg | ORAL_TABLET | Freq: Four times a day (QID) | ORAL | Status: DC | PRN
Start: 2021-06-26 — End: 2021-06-26

## 2021-06-26 MED ORDER — ENOXAPARIN SODIUM 30 MG/0.3ML IJ SOSY: 30.0000 mg | PREFILLED_SYRINGE | INTRAMUSCULAR | Status: DC

## 2021-06-26 MED ORDER — LORATADINE 10 MG PO TABS
10.0000 mg | ORAL_TABLET | Freq: Every day | ORAL | Status: DC
  Administered 2021-06-26 – 2021-06-29 (×3): 10 mg via ORAL
  Filled 2021-06-26 (×3): qty 1

## 2021-06-26 MED ORDER — PROCHLORPERAZINE EDISYLATE 10 MG/2ML IJ SOLN: 5.0000 mg | Freq: Four times a day (QID) | INTRAMUSCULAR | Status: DC | PRN

## 2021-06-26 MED ORDER — ENSURE ENLIVE PO LIQD
237.0000 mL | Freq: Two times a day (BID) | ORAL | Status: DC
  Administered 2021-06-26 – 2021-06-29 (×5): 237 mL via ORAL

## 2021-06-26 MED ORDER — UMECLIDINIUM BROMIDE 62.5 MCG/INH IN AEPB
1.0000 | INHALATION_SPRAY | Freq: Every day | RESPIRATORY_TRACT | Status: DC
  Administered 2021-06-27 – 2021-06-29 (×3): 1 via RESPIRATORY_TRACT
  Filled 2021-06-26: qty 7

## 2021-06-26 MED ORDER — PANTOPRAZOLE SODIUM 40 MG PO TBEC: 40.0000 mg | DELAYED_RELEASE_TABLET | Freq: Every day | ORAL | Status: DC

## 2021-06-26 MED ORDER — ALBUTEROL SULFATE (2.5 MG/3ML) 0.083% IN NEBU: 2.5000 mg | INHALATION_SOLUTION | RESPIRATORY_TRACT | Status: DC | PRN

## 2021-06-26 MED ORDER — INFLUENZA VAC A&B SA ADJ QUAD 0.5 ML IM PRSY
0.5000 mL | PREFILLED_SYRINGE | INTRAMUSCULAR | Status: AC
  Administered 2021-06-27: 0.5 mL via INTRAMUSCULAR
  Filled 2021-06-26: qty 0.5

## 2021-06-26 MED ORDER — DICLOFENAC SODIUM 1 % EX GEL
2.0000 g | Freq: Four times a day (QID) | CUTANEOUS | Status: DC
  Administered 2021-06-28: 2 g via TOPICAL
  Filled 2021-06-26: qty 100

## 2021-06-26 MED ORDER — LEVOTHYROXINE SODIUM 88 MCG PO TABS
88.0000 ug | ORAL_TABLET | Freq: Every day | ORAL | Status: DC
  Administered 2021-06-27 – 2021-06-29 (×2): 88 ug via ORAL
  Filled 2021-06-26 (×2): qty 1

## 2021-06-26 MED ORDER — HYDROCODONE-ACETAMINOPHEN 7.5-325 MG/15ML PO SOLN
10.0000 mL | Freq: Four times a day (QID) | ORAL | Status: DC | PRN
  Administered 2021-06-28: 10 mL via ORAL
  Filled 2021-06-26: qty 15

## 2021-06-26 MED ORDER — SODIUM CHLORIDE 0.9 % IV SOLN: INTRAVENOUS | Status: DC

## 2021-06-26 MED ORDER — PANTOPRAZOLE SODIUM 40 MG IV SOLR
40.0000 mg | Freq: Every day | INTRAVENOUS | Status: DC
  Administered 2021-06-26 – 2021-06-29 (×4): 40 mg via INTRAVENOUS
  Filled 2021-06-26 (×4): qty 40

## 2021-06-26 NOTE — Plan of Care (Signed)

## 2021-06-26 NOTE — H&P (Addendum)
[place]  Telephone:([phone] Fax:([phone]) [phone]   MEDICAL ONCOLOGY - ADMISSION H&P  Reason for Admission: Esophageal mass, dysphagia, dehydration  HPI: Mr. Lee Wilson is a 75 year old male with a past medical history significant for chronic back pain, T12 compression fracture, history of heavy alcohol use, remote history of tobacco use, GERD, Barrett's esophagus.  He was noted to have a distal esophageal mass on EGD from 05/08/2021.  Biopsy negative for malignancy.  PET scan from 05/26/2021 showed that the mid to lower esophageal mass was hypermetabolic and small subcarinal lymph node had borderline FDG uptake.  A repeat endoscopy/biopsy on 05/31/2021 was also nondiagnostic.  He has had persistent difficulty with dysphagia for solids and liquids.  He has been taking liquid pain medication which has been effective for him.  The patient tells me that he had some difficulty with chest pain last night and vomited.  He has a lot of difficulty swallowing liquids and pills.  However, he tells me that he can eat soft foods.  He otherwise is not having any complaints today.  The patient is being admitted today to expedite work-up with repeat EGD/EUS for further evaluation of his esophageal mass.  Past Medical History:  Diagnosis Date   Asthma    Fall from height of greater than 3 feet 07/16/2016   GERD (gastroesophageal reflux disease)    Leg cramp 01/04/2021  :   Past Surgical History:  Procedure Laterality Date   FRACTURE SURGERY    :   Current Facility-Administered Medications  Medication Dose Route Frequency Provider Last Rate Last Admin   0.9 %  sodium chloride infusion   Intravenous Continuous Curcio, Lee Awkward, NP          Allergies  Allergen Reactions   Penicillin G Other (See Comments)  :   Family History  Problem Relation Age of Onset   Leukemia Mother    Heart attack Father   :   Social History   Socioeconomic History   Marital status: Widowed     Spouse name: Not on file   Number of children: Not on file   Years of education: Not on file   Highest education level: Not on file  Occupational History   Not on file  Tobacco Use   Smoking status: Former   Smokeless tobacco: Never  Substance and Sexual Activity   Alcohol use: Not Currently   Drug use: No   Sexual activity: Not on file  Other Topics Concern   Not on file  Social History Narrative   Left handed   Lives in a one story home    Drinks coffee    Social Determinants of Health   Financial Resource Strain: Low Risk    Difficulty of Paying Living Expenses: Not hard at all  Food Insecurity: No Food Insecurity   Worried About Charity fundraiser in the Last Year: Never true   [place] in the Last Year: Never true  Transportation Needs: No Transportation Needs   Lack of Transportation (Medical): No   Lack of Transportation (Non-Medical): No  Physical Activity: Not on file  Stress: No Stress Concern Present   Feeling of Stress : Only a little  Social Connections: Socially Isolated   Frequency of Communication with Friends and Family: More than three times a week   Frequency of Social Gatherings with Friends and Family: More than three times a week   Attends Religious Services: Never   Retail buyer of [employer]  or Organizations: No   Attends Archivist Meetings: Never   Marital Status: Widowed  Intimate Partner Violence: Not on file  :  Review of Systems: A comprehensive 14 point review of systems was negative except as noted in the HPI.  Exam: No data found.  General: Awake and alert, no distress. Eyes:  no scleral icterus.   ENT:  There were no oropharyngeal lesions.   Respiratory: lungs were clear bilaterally without wheezing or crackles.   Cardiovascular:  Regular rate and rhythm, S1/S2, without murmur, rub or gallop.  There was no pedal edema.   GI:  abdomen was soft, flat, nontender, nondistended, without organomegaly.   Skin exam was  without echymosis, petichae.   Neuro exam was nonfocal. Patient was alert and oriented.  Attention was good.   Language was appropriate.  Mood was normal without depression.  Speech was not pressured.  Thought content was not tangential.     Lab Results  Component Value Date   WBC 3.3 (L) 06/07/2021   HGB 12.5 (L) 06/07/2021   HCT 36.5 (L) 06/07/2021   PLT 283 06/07/2021   GLUCOSE 206 (H) 06/07/2021   CHOL 212 (H) 06/09/2021   TRIG 168 (H) 06/09/2021   HDL 45 06/09/2021   LDLCALC 136 (H) 06/09/2021   ALT 9 06/07/2021   AST 16 06/07/2021   NA 135 06/07/2021   K 3.5 06/07/2021   CL 95 (L) 06/07/2021   CREATININE 1.00 06/07/2021   BUN 20 06/07/2021   CO2 27 06/07/2021    No results found.   No results found.  Assessment and Plan:  Distal esophagus mass Mass at 30 cm on EGD 05/08/2021-biopsy negative for malignancy CT abdomen/pelvis 04/24/2021-masslike thickening of the distal esophagus, T12 compression fracture-age-indeterminate PET scan 05/26/2021-mid to lower esophageal mass is hypermetabolic.  Small subcarinal lymph node with borderline FDG uptake. Repeat endoscopy/biopsy 05/31/2021-we do not have a copy of the procedure report.  Pathology revealed no malignancy 2.Solid/liquid dysphagia secondary to #1 3.Chronic back pain 4.T12 compression fracture-likely benign 5. History of heavy alcohol use 6. Remote history of tobacco use 7. History of gastroesophageal reflux disease 8. Barrett's esophagus on EGD 05/08/2021 9. Weight loss secondary to #1  Mr. Lee Wilson has an esophageal mass with dysphagia for solids and liquids.  He also is dehydrated.  He has undergone 2 biopsies of the esophageal mass which have been nondiagnostic.  He is now admitted to expedite work-up of the esophageal mass with EGD/EUS.  We will plan to admit to the hospital with GI consult for EGD/EUS.  We will start on IV fluids for dehydration.  We will give him a soft diet as tolerated.  Continue home pain  medication and other home medications.  We will obtain baseline lab studies prior to procedure including a CBC with differential, c-Met, PT/INR, PTT.  Dietitian consultation also recommended.  Recommendations: 1.  Admit to the hospital. 2.  GI consultation for EGD/EUS. 3.  IV fluids with normal saline at 75 cc/h. 4.  Continue home medications including pain medication. 5.  Soft diet as tolerated. 6.  Baseline lab work including a CBC with differential, c-Met, PT/INR, PTT. 7.  Dietitian consultation.  Lee Bussing, DNP, AGPCNP-BC, AOCNP   Mr. Lee Wilson was interviewed and examined.  The repeat esophagus biopsy is nondiagnostic.  He continues to have dysphagia and weight loss.  He is being admitted for supportive care measures and for a repeat EGD/EUS.  He continues to have solid dysphagia.  We discussed  feeding tube placement.  He does not want to have a gastrostomy tube placed.  We will continue intravenous hydration, antiacid therapy, and analgesics.  I discussed the case with Dr. Rush Landmark.  I was present for greater than 50% of today's visit.  I performed medical decision making.

## 2021-06-26 NOTE — Consult Note (Addendum)
Referring Provider:  Primary Care Physician:  Eulogio Bear, NP Primary Gastroenterologist:  Dr. Alessandra Bevels  Reason for Consultation:  weight loss, esophageal mass  HPI: Lee Wilson is a 75 y.o. 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 Endoscopy 05/08/2021 showed 4 cm fungated, ulcerated mass 30 cm from incisors, with evidence of Barrett's, pathology showed reactive gastropathy, H. pylori negative, no evidence of carcinoma with ulceration at gastroesophageal junction Repeat endoscopy 05/31/2021 and showed inconclusive pathology, confirmed by Belva Bertin. Patient currently admitted for dehydration and for EUS with fine-needle aspiration to evaluate known esophageal mass.   Patient has had 2 doses of radiation therapy August 22 and 23rd.  Patient states he has had least 3 months of dysphagia to solid foods, some issues with liquids as well.  Will have nausea and vomiting, denies hematemesis. Has had reflux, occasionally choking at night, does better sitting. Patient has been on lansoprazole and Carafate Patient has had about a 10 pound weight loss in the last 2 to 3 months per patient. History of moderate alcohol use since age 64, states has not had any alcohol in last 3 months. States he has not smoked any cigarettes since age of 8  Denies anti-inflammatory use. Patient is not on any blood thinners. Patient has had black or darker stools, denies iron or Pepto use. Patient has baseline shortness of breath and cough due to COPD and asthma.  PET 05/27/2021 IMPRESSION: 1. Mid to lower esophageal mass is hypermetabolic and consistent with known esophageal cancer. No involvement of the distal esophagus or GE junction. 2. Small subcarinal lymph node with borderline FDG uptake. Attention on future studies suggested. No other findings for  locoregional adenopathy or metastatic disease.  Past Medical History:  Diagnosis Date   Asthma    Fall from height of greater than 3 feet 07/16/2016   GERD (gastroesophageal reflux disease)    Leg cramp 01/04/2021    Past Surgical History:  Procedure Laterality Date   FRACTURE SURGERY      Prior to Admission medications   Medication Sig Start Date End Date Taking? Authorizing Provider  albuterol (VENTOLIN HFA) 108 (90 Base) MCG/ACT inhaler Inhale 2 puffs into the lungs every 4 (four) hours as needed for wheezing or shortness of breath. 06/09/21   Eulogio Bear, NP  cetirizine (ZYRTEC) 10 MG tablet Take 1 tablet (10 mg total) by mouth daily. 12/09/20   Alycia Rossetti, MD  dexamethasone (DECADRON) 2 MG tablet Take 10 mg (5 tabs) at 10pm night prior to first chemo and 6am morning of first chemo 06/06/21   Owens Shark, NP  diclofenac Sodium (VOLTAREN) 1 % GEL Apply to back three times as needed for pain 12/09/20   Alycia Rossetti, MD  gatifloxacin (ZYMAXID) 0.5 % SOLN INSTILL 1 DROP IN RIGHT EYE FOUR TIMES DAILY AS DIRECTED 02/24/21   [provider]  HYDROcodone-acetaminophen (HYCET) 7.5-325 mg/15 ml solution Take 10 mLs by mouth every 6 (six) hours as needed for moderate pain. 05/17/21   Ladell Pier, MD  hydrocortisone 2.5 % cream Apply topically 2 (two) times daily. Apply to face 02/16/21   Eulogio Bear, NP  lansoprazole (PREVACID) 30 MG capsule Take 1 capsule (30 mg total) by mouth daily at 12 noon. Open capsule and sprinkle in applesauce, pudding or yogurt. Do not crush medication 06/07/21   Ladell Pier,  MD  levothyroxine (SYNTHROID) 88 MCG tablet Take 1 tablet (88 mcg total) by mouth daily before breakfast. 02/16/21   Noemi Chapel A, NP  lidocaine (LIDODERM) 5 % Place 1 patch onto the skin daily. Remove & Discard patch within 12 hours or as directed by MD 05/15/21   Eulogio Bear, NP  prochlorperazine (COMPAZINE) 5 MG tablet Take 1 tablet (5 mg total)  by mouth every 6 (six) hours as needed for nausea or vomiting. 05/26/21   Owens Shark, NP  simvastatin (ZOCOR) 80 MG tablet Take 1 tablet (80 mg total) by mouth at bedtime. 02/24/21   Eulogio Bear, NP  umeclidinium bromide (INCRUSE ELLIPTA) 62.5 MCG/INH AEPB Inhale 1 puff into the lungs daily. 06/21/21   Eulogio Bear, NP    Scheduled Meds:  atorvastatin  40 mg Oral Daily   diclofenac Sodium  2 g Topical QID   enoxaparin (LOVENOX) injection  40 mg Subcutaneous Q24H   [START ON 06/27/2021] levothyroxine  88 mcg Oral QAC breakfast   loratadine  10 mg Oral Daily   pantoprazole  40 mg Oral Daily   umeclidinium bromide  1 puff Inhalation Daily   Continuous Infusions:  sodium chloride     PRN Meds:.albuterol, HYDROcodone-acetaminophen, prochlorperazine  Allergies as of 06/26/2021 - Review Complete 06/26/2021  Allergen Reaction Noted   Penicillin g Other (See Comments) 08/16/2016    Family History  Problem Relation Age of Onset   Leukemia Mother    Heart attack Father     Social History   Socioeconomic History   Marital status: Widowed    Spouse name: Not on file   Number of children: Not on file   Years of education: Not on file   Highest education level: Not on file  Occupational History   Not on file  Tobacco Use   Smoking status: Former   Smokeless tobacco: Never  Substance and Sexual Activity   Alcohol use: Not Currently   Drug use: No   Sexual activity: Not on file  Other Topics Concern   Not on file  Social History Narrative   Left handed   Lives in a one story home    Drinks coffee    Social Determinants of Health   Financial Resource Strain: Low Risk    Difficulty of Paying Living Expenses: Not hard at all  Food Insecurity: No Food Insecurity   Worried About Charity fundraiser in the Last Year: Never true   [place] in the Last Year: Never true  Transportation Needs: No Transportation Needs   Lack of Transportation (Medical): No    Lack of Transportation (Non-Medical): No  Physical Activity: Not on file  Stress: No Stress Concern Present   Feeling of Stress : Only a little  Social Connections: Socially Isolated   Frequency of Communication with Friends and Family: More than three times a week   Frequency of Social Gatherings with Friends and Family: More than three times a week   Attends Religious Services: Never   Marine scientist or Organizations: No   Attends Archivist Meetings: Never   Marital Status: Widowed  Intimate Partner Violence: Not on file    Review of Systems:  Review of Systems  Constitutional:  Positive for weight loss. Negative for chills and fever.  Respiratory:  Positive for cough and shortness of breath.        Patient states he is at his baseline for shortness of breath  and cough, denies sputum production or wheezing.  Use inhaler for asthma/COPD  Cardiovascular:  Negative for chest pain.  Gastrointestinal:  Positive for heartburn, melena (reports possible melena, denies iron and pepto use.), nausea and vomiting. Negative for abdominal pain, blood in stool, constipation and diarrhea.  Musculoskeletal:  Negative for falls.  Skin:  Negative for rash.  Neurological:  Negative for focal weakness.  Psychiatric/Behavioral:  Negative for memory loss.     Physical Exam: Vital signs: Vitals:   06/26/21 1459  BP: 124/71  Pulse: 86  Resp: 18  Temp: 97.9 F (36.6 C)     Physical Exam Constitutional:      General: He is not in acute distress.    Appearance: He is cachectic.  HENT:     Head: Normocephalic.  Eyes:     General: No scleral icterus.    Conjunctiva/sclera: Conjunctivae normal.  Cardiovascular:     Rate and Rhythm: Normal rate and regular rhythm.     Heart sounds: No murmur heard.    Comments: Distant heart sounds Pulmonary:     Effort: Pulmonary effort is normal.     Comments: Barrel chest, CTAB posterior but diffuse decreased breath sounds, minor anterior  wheezing Abdominal:     General: Abdomen is flat. Bowel sounds are normal.     Palpations: Abdomen is soft.     Tenderness: There is no abdominal tenderness. There is no guarding or rebound.  Musculoskeletal:        General: Normal range of motion.  Skin:    Coloration: Skin is not jaundiced.  Neurological:     General: No focal deficit present.  Psychiatric:        Mood and Affect: Mood normal.        Behavior: Behavior is cooperative.     GI:  Lab Results: No results for input(s): WBC, HGB, HCT, PLT in the last 72 hours. BMET No results for input(s): NA, K, CL, CO2, GLUCOSE, BUN, CREATININE, CALCIUM in the last 72 hours. LFT No results for input(s): PROT, ALBUMIN, AST, ALT, ALKPHOS, BILITOT, BILIDIR, IBILI in the last 72 hours. PT/INR No results for input(s): LABPROT, INR in the last 72 hours.  Studies/Results: No results found.  Impression and Plan Esophageal mass Patient has had 2 inconclusive endoscopy Will set up EUS with [facility]- will discuss with Dr. Rush Landmark and appreciate the consult for EUS  History of Barrett's esophagus pantoprazole 40 mg BID  Continue Carafate QID  Weight loss Poor PO intake IV fluids    LOS: 0 days   Vladimir Crofts  PA-C 06/26/2021, 3:03 PM  Contact #  [phone]

## 2021-06-26 NOTE — Telephone Encounter (Signed)
Spoke w/Robin in [employer] and requested oncology or med surg bed at [facility] today for direct admission from home.  Esophageal CA, dysphagia,dehydration

## 2021-06-27 ENCOUNTER — Ambulatory Visit: Payer: Medicare Other

## 2021-06-27 DIAGNOSIS — K229 Disease of esophagus, unspecified: Secondary | ICD-10-CM | POA: Diagnosis not present

## 2021-06-27 DIAGNOSIS — R634 Abnormal weight loss: Secondary | ICD-10-CM | POA: Diagnosis not present

## 2021-06-27 DIAGNOSIS — R1319 Other dysphagia: Secondary | ICD-10-CM | POA: Diagnosis not present

## 2021-06-27 DIAGNOSIS — E860 Dehydration: Secondary | ICD-10-CM | POA: Diagnosis not present

## 2021-06-27 DIAGNOSIS — D509 Iron deficiency anemia, unspecified: Secondary | ICD-10-CM

## 2021-06-27 MED ORDER — ADULT MULTIVITAMIN W/MINERALS CH
1.0000 | ORAL_TABLET | Freq: Every day | ORAL | Status: DC
  Administered 2021-06-27 – 2021-06-29 (×2): 1 via ORAL
  Filled 2021-06-27 (×2): qty 1

## 2021-06-27 MED ORDER — ENOXAPARIN SODIUM 30 MG/0.3ML IJ SOSY: 30.0000 mg | PREFILLED_SYRINGE | INTRAMUSCULAR | Status: DC

## 2021-06-27 MED ORDER — ENOXAPARIN SODIUM 40 MG/0.4ML IJ SOSY: 40.0000 mg | PREFILLED_SYRINGE | INTRAMUSCULAR | Status: DC

## 2021-06-27 MED ORDER — ENOXAPARIN SODIUM 30 MG/0.3ML IJ SOSY
30.0000 mg | PREFILLED_SYRINGE | INTRAMUSCULAR | Status: AC
  Administered 2021-06-27: 30 mg via SUBCUTANEOUS
  Filled 2021-06-27: qty 0.3

## 2021-06-27 MED ORDER — BENEPROTEIN PO POWD
1.0000 | Freq: Three times a day (TID) | ORAL | Status: DC
  Filled 2021-06-27: qty 227

## 2021-06-27 MED ORDER — RESOURCE INSTANT PROTEIN PO PWD PACKET
6.0000 g | Freq: Three times a day (TID) | ORAL | Status: DC
  Administered 2021-06-27 – 2021-06-29 (×2): 6 g via ORAL
  Filled 2021-06-27 (×6): qty 6

## 2021-06-27 NOTE — Anesthesia Preprocedure Evaluation (Addendum)
Anesthesia Evaluation  Patient identified by MRN, date of birth, ID band Patient awake    Reviewed: Allergy & Precautions, NPO status , Patient's Chart, lab work & pertinent test results  Airway Mallampati: II  TM Distance: >3 FB Neck ROM: Limited    Dental no notable dental hx.    Pulmonary neg pulmonary ROS, asthma , former smoker,    Pulmonary exam normal breath sounds clear to auscultation       Cardiovascular negative cardio ROS Normal cardiovascular exam Rhythm:Regular Rate:Normal     Neuro/Psych negative neurological ROS  negative psych ROS   GI/Hepatic GERD  ,(+)     substance abuse  alcohol use, esophageal mass    Endo/Other  Hypothyroidism   Renal/GU negative Renal ROS  negative genitourinary   Musculoskeletal negative musculoskeletal ROS (+) Arthritis ,   Abdominal   Peds negative pediatric ROS (+)  Hematology  (+) anemia , Lab Results      Component                Value               Date                      WBC                      6.0                 06/26/2021                HGB                      11.6 (L)            06/26/2021                HCT                      33.3 (L)            06/26/2021                MCV                      96.5                06/26/2021                PLT                      185                 06/26/2021              Anesthesia Other Findings   Reproductive/Obstetrics negative OB ROS                            Anesthesia Physical Anesthesia Plan  ASA: 4  Anesthesia Plan: MAC   Post-op Pain Management:    Induction: Intravenous  PONV Risk Score and Plan: Treatment may vary due to age or medical condition and Propofol infusion  Airway Management Planned: Nasal Cannula  Additional Equipment: None  Intra-op Plan:   Post-operative Plan:   Informed Consent: I have reviewed the patients History and Physical, chart, labs and  discussed the procedure including the risks, benefits and alternatives for  the proposed anesthesia with the patient or authorized representative who has indicated his/her understanding and acceptance.     Dental advisory given  Plan Discussed with: CRNA and Surgeon  Anesthesia Plan Comments:        Anesthesia Quick Evaluation

## 2021-06-27 NOTE — Progress Notes (Signed)
Initial Nutrition Assessment  DOCUMENTATION CODES:   Underweight, Severe malnutrition in context of chronic illness  INTERVENTION:   -Ensure Plus PO BID, each provides 350 kcals and 13g protein  -Multivitamin with minerals daily  -Magic cup TID with meals, each supplement provides 290 kcal and 9 grams of protein  -Beneprotein powder TID with meals, each provides 25 kcals and 6g protein   NUTRITION DIAGNOSIS:   Severe Malnutrition related to chronic illness, cancer and cancer related treatments as evidenced by percent weight loss, energy intake < or equal to 75% for > or equal to 1 month, severe fat depletion, severe muscle depletion.  GOAL:   Patient will meet greater than or equal to 90% of their needs  MONITOR:   PO intake, Supplement acceptance, Labs, Weight trends, I & O's  REASON FOR ASSESSMENT:   Consult Assessment of nutrition requirement/status  ASSESSMENT:   75 y.o. male with history of CAD, COPD, daily alcohol use, macrocytic anemia  Patient found to have esophageal cancer on endoscopy due to progressive dysphagia, nausea and weight loss.  Endoscopy 05/08/2021 showed 4 cm fungated, ulcerated mass 30 cm from incisors, with evidence of Barrett's, pathology showed reactive gastropathy, H. pylori negative, no evidence of carcinoma with ulceration at gastroesophageal junction  Repeat endoscopy 05/31/2021 and showed inconclusive pathology, confirmed by Belva Bertin.  Patient currently admitted for dehydration and for EUS with fine-needle aspiration to evaluate known esophageal mass.  Patient in bed, sipping on an Ensure supplement. Pt states he likes them but can't drink more than 2 a day or they cause diarrhea. Pt ate some eggs for breakfast but was unable to swallow the bacon he had well. Pt was placed on GI soft diet, will switch to dysphagia 3 diet for swallowing purposes. Pt has had dysphagia for 3 months now. Last seen by Ewing RD on 8/26, pt was continuing to  lose weight. Pt diagnosed with severe malnutrition then, malnutrition continues. Pt to continue Ensure BID, RD to order Beneprotein and Magic cups with meals. Per oncology note, a feeding tube was discussed with patient. Pt re-emphasizes to RD today that he does not want a feeding tube. Will try to maximize calories and protein with meals and supplements.  Per weight records, pt has lost 24 lbs since 5/5 (21% wt loss x 4.5 months, significant for time frame).  Medications reviewed.  Labs reviewed.  NUTRITION - FOCUSED PHYSICAL EXAM:  Flowsheet Row Most Recent Value  Orbital Region Moderate depletion  Upper Arm Region Severe depletion  Thoracic and Lumbar Region Unable to assess  Buccal Region Severe depletion  Temple Region Severe depletion  Clavicle Bone Region Severe depletion  Clavicle and Acromion Bone Region Severe depletion  Scapular Bone Region Severe depletion  Dorsal Hand Severe depletion  Patellar Region Unable to assess  Anterior Thigh Region Unable to assess  Posterior Calf Region Unable to assess  Edema (RD Assessment) None  Hair Reviewed  Eyes Reviewed  Mouth Reviewed  [missing teeth]  Skin Reviewed       Diet Order:   Diet Order             DIET DYS 3 Room service appropriate? Yes; Fluid consistency: Thin  Diet effective now                   EDUCATION NEEDS:   Education needs have been addressed  Skin:  Skin Assessment: Reviewed RN Assessment  Last BM:  9/12  Height:   Ht Readings from  Last 1 Encounters:  06/26/21 '5\' 5"'$  (1.651 m)    Weight:   Wt Readings from Last 1 Encounters:  06/26/21 40.3 kg    BMI:  Body mass index is 14.78 kg/m.  Estimated Nutritional Needs:   Kcal:  1600-1800  Protein:  75-90g  Fluid:  1.8L/day  Clayton Bibles, MS, RD, LDN Inpatient Clinical Dietitian Contact information available via Amion

## 2021-06-27 NOTE — Progress Notes (Addendum)
HEMATOLOGY-ONCOLOGY PROGRESS NOTE  SUBJECTIVE: Reports intermittent pain with swallowing.  Hydrocodone effective.  Tolerating soft diet.  PHYSICAL EXAMINATION:  Vitals:   06/26/21 1939 06/27/21 0431  BP: 102/63 112/64  Pulse: 69 90  Resp: 18 20  Temp: 98.4 F (36.9 C) 97.8 F (36.6 C)  SpO2: 100% 99%   Filed Weights   06/26/21 1522  Weight: 40.3 kg    Intake/Output from previous day: 09/12 0701 - 09/13 0700 In: 803.1 [P.O.:120; I.V.:683.1] Out: -   GENERAL:alert, no distress and comfortable SKIN: skin color, texture, turgor are normal, no rashes or significant lesions EYES: normal, Conjunctiva are pink and non-injected, sclera clear OROPHARYNX:no exudate, no erythema and lips, buccal mucosa, and tongue normal  LUNGS: clear to auscultation and percussion with normal breathing effort HEART: regular rate & rhythm and no murmurs and no lower extremity edema ABDOMEN:abdomen soft, non-tender and normal bowel sounds NEURO: alert & oriented x 3 with fluent speech, no focal motor/sensory deficits  LABORATORY DATA:  I have reviewed the data as listed CMP Latest Ref Rng & Units 06/26/2021 06/07/2021 05/15/2021  Glucose 70 - 99 mg/dL 95 206(H) 92  BUN 8 - 23 mg/dL '12 20 22  '$ Creatinine 0.61 - 1.24 mg/dL 0.86 1.00 1.05  Sodium 135 - 145 mmol/L 140 135 140  Potassium 3.5 - 5.1 mmol/L 3.8 3.5 3.7  Chloride 98 - 111 mmol/L 103 95(L) 99  CO2 22 - 32 mmol/L '27 27 25  '$ Calcium 8.9 - 10.3 mg/dL 9.2 9.3 9.1  Total Protein 6.5 - 8.1 g/dL 6.9 7.5 7.1  Total Bilirubin 0.3 - 1.2 mg/dL 0.7 0.7 0.5  Alkaline Phos 38 - 126 U/L 47 46 -  AST 15 - 41 U/L '18 16 19  '$ ALT 0 - 44 U/L '9 9 14    '$ Lab Results  Component Value Date   WBC 6.0 06/26/2021   HGB 11.6 (L) 06/26/2021   HCT 33.3 (L) 06/26/2021   MCV 96.5 06/26/2021   PLT 185 06/26/2021   NEUTROABS 3.9 06/26/2021    No results found.  ASSESSMENT AND PLAN: Distal esophagus mass Mass at 30 cm on EGD 05/08/2021-biopsy negative for  malignancy CT abdomen/pelvis 04/24/2021-masslike thickening of the distal esophagus, T12 compression fracture-age-indeterminate PET scan 05/26/2021-mid to lower esophageal mass is hypermetabolic.  Small subcarinal lymph node with borderline FDG uptake. Repeat endoscopy/biopsy 05/31/2021-we do not have a copy of the procedure report.  Pathology revealed no malignancy 2.Solid/liquid dysphagia secondary to #1 3.Chronic back pain 4.T12 compression fracture-likely benign 5. History of heavy alcohol use 6. Remote history of tobacco use 7. History of gastroesophageal reflux disease 8. Barrett's esophagus on EGD 05/08/2021 9. Weight loss secondary to #1 10.  Normocytic anemia  Lee Wilson appears stable.  He has an esophageal mass and has had 2 biopsies which have been nondiagnostic. He was evaluated by GI and plan is for EUS and EGD by Dr. Rush Landmark 9/14.  Will plan to continue him on a soft diet.  N.p.o. after midnight anticipation of procedure.  Continue IV fluids, home medications, and as needed pain medication.  Recommend out of bed as tolerated.  Dietitian consult has been ordered already.  We will plan to discharge home following EUS and EGD once deemed stable by GI.  Recommendations: 1.  Soft diet as tolerated 2.  Continue home medications and as needed pain medication 3.  N.p.o. after midnight for EUS and EGD tomorrow. 4.  Hold a.m. dose of Lovenox tomorrow. 5.  Dietitian consult 6.  Continue IV fluids   LOS: 1 day   Lee Bussing, DNP, AGPCNP-BC, AOCNP 06/27/21 Lee Wilson was interviewed and examined.  He appears unchanged.  An EUS scheduled for tomorrow.  We will continue supportive care.  I was present for greater than 50% of today's visit.  I performed medical decision making.

## 2021-06-27 NOTE — Progress Notes (Signed)
[facility] Gastroenterology Progress Note  Lee Wilson 75 y.o. 02-23-1946  CC:  Esophageal Mass   Subjective: Patient has no complaints. Reports he did not eat dinner last night and hasn't eaten today yet, had a difficult time figuring out how to order meals. Denies melena/hematochezia. Denies nausea, vomiting, abdominal pain, dysphagia.  ROS : Review of Systems  Cardiovascular:  Negative for chest pain and palpitations.  Gastrointestinal:  Negative for abdominal pain, blood in stool, constipation, diarrhea, heartburn, melena, nausea and vomiting.     Objective: Vital signs in last 24 hours: Vitals:   06/26/21 1939 06/27/21 0431  BP: 102/63 112/64  Pulse: 69 90  Resp: 18 20  Temp: 98.4 F (36.9 C) 97.8 F (36.6 C)  SpO2: 100% 99%    Physical Exam:  General:  Alert, cooperative, no distress  Head:  Normocephalic, without obvious abnormality, atraumatic  Eyes:  Anicteric sclera, EOM's intact  Lungs:   Clear to auscultation bilaterally, respirations unlabored  Heart:  Regular rate and rhythm, S1, S2 normal  Abdomen:   Soft, non-tender, bowel sounds active all four quadrants,  no masses,     Lab Results: Recent Labs    06/26/21 1704  NA 140  K 3.8  CL 103  CO2 27  GLUCOSE 95  BUN 12  CREATININE 0.86  CALCIUM 9.2   Recent Labs    06/26/21 1704  AST 18  ALT 9  ALKPHOS 47  BILITOT 0.7  PROT 6.9  ALBUMIN 3.5   Recent Labs    06/26/21 1704  WBC 6.0  NEUTROABS 3.9  HGB 11.6*  HCT 33.3*  MCV 96.5  PLT 185   Recent Labs    06/26/21 1704  LABPROT 12.8  INR 1.0      Assessment/Plan Esophageal mass Patient has had 2 inconclusive endoscopies - EUS with Dr. Rush Landmark tomrorow I thoroughly discussed the procedures to include nature, alternatives, benefits, and risks including but not limited to bleeding, perforation, infection, anesthesia/cardiac and pulmonary complications. Patient provides understanding and gave verbal consent to proceed.    History of Barrett's esophagus pantoprazole 40 mg BID  Continue Carafate QID   Weight loss Poor PO intake, can continue diet as tolerated. Discussed with nurse to aid with ordering meals for patient as he was unaware how to do so. NPO at midnight. IV fluids  [facility] will follow  Garnette Scheuermann PA-C 06/27/2021, 9:48 AM  Contact #  [phone]

## 2021-06-28 ENCOUNTER — Inpatient Hospital Stay (HOSPITAL_COMMUNITY): Payer: Medicare Other | Admitting: Anesthesiology

## 2021-06-28 ENCOUNTER — Ambulatory Visit: Payer: Medicare Other

## 2021-06-28 ENCOUNTER — Encounter (HOSPITAL_COMMUNITY): Payer: Self-pay | Admitting: Oncology

## 2021-06-28 ENCOUNTER — Encounter (HOSPITAL_COMMUNITY)
Admission: AD | Disposition: A | Discharge status: Home / Self Care | Payer: Self-pay | Source: Home / Self Care | Attending: Oncology

## 2021-06-28 ENCOUNTER — Encounter: Payer: Medicare Other | Admitting: Dietician

## 2021-06-28 DIAGNOSIS — E43 Unspecified severe protein-calorie malnutrition: Secondary | ICD-10-CM | POA: Insufficient documentation

## 2021-06-28 DIAGNOSIS — K3189 Other diseases of stomach and duodenum: Secondary | ICD-10-CM

## 2021-06-28 DIAGNOSIS — E860 Dehydration: Secondary | ICD-10-CM | POA: Diagnosis not present

## 2021-06-28 DIAGNOSIS — R1319 Other dysphagia: Secondary | ICD-10-CM | POA: Diagnosis not present

## 2021-06-28 DIAGNOSIS — K229 Disease of esophagus, unspecified: Secondary | ICD-10-CM | POA: Diagnosis not present

## 2021-06-28 DIAGNOSIS — R634 Abnormal weight loss: Secondary | ICD-10-CM | POA: Diagnosis not present

## 2021-06-28 DIAGNOSIS — K2210 Ulcer of esophagus without bleeding: Principal | ICD-10-CM

## 2021-06-28 HISTORY — PX: ESOPHAGOGASTRODUODENOSCOPY (EGD) WITH PROPOFOL: SHX5813

## 2021-06-28 HISTORY — PX: BIOPSY: SHX5522

## 2021-06-28 HISTORY — PX: UPPER ESOPHAGEAL ENDOSCOPIC ULTRASOUND (EUS): SHX6562

## 2021-06-28 HISTORY — PX: FINE NEEDLE ASPIRATION: SHX5430

## 2021-06-28 SURGERY — UPPER ESOPHAGEAL ENDOSCOPIC ULTRASOUND (EUS)
Anesthesia: Monitor Anesthesia Care

## 2021-06-28 MED ORDER — LACTATED RINGERS IV SOLN
INTRAVENOUS | Status: DC
  Administered 2021-06-28: 1000 mL via INTRAVENOUS

## 2021-06-28 MED ORDER — PROPOFOL 10 MG/ML IV BOLUS
INTRAVENOUS | Status: DC | PRN
  Administered 2021-06-28: 20 mg via INTRAVENOUS

## 2021-06-28 MED ORDER — PHENYLEPHRINE 40 MCG/ML (10ML) SYRINGE FOR IV PUSH (FOR BLOOD PRESSURE SUPPORT)
PREFILLED_SYRINGE | INTRAVENOUS | Status: DC | PRN
  Administered 2021-06-28 (×2): 80 ug via INTRAVENOUS
  Administered 2021-06-28: 120 ug via INTRAVENOUS

## 2021-06-28 MED ORDER — SODIUM CHLORIDE 0.9 % IV SOLN: INTRAVENOUS | Status: DC

## 2021-06-28 MED ORDER — PROPOFOL 500 MG/50ML IV EMUL
INTRAVENOUS | Status: DC | PRN
  Administered 2021-06-28: 75 ug/kg/min via INTRAVENOUS

## 2021-06-28 MED ORDER — ENOXAPARIN SODIUM 30 MG/0.3ML IJ SOSY: 30.0000 mg | PREFILLED_SYRINGE | INTRAMUSCULAR | Status: DC

## 2021-06-28 MED ORDER — LIDOCAINE 2% (20 MG/ML) 5 ML SYRINGE
INTRAMUSCULAR | Status: DC | PRN
  Administered 2021-06-28: 40 mg via INTRAVENOUS

## 2021-06-28 NOTE — Progress Notes (Signed)
Full procedure note will be completed and hopefully up loaded to the system later today.  In brief, EGD showed esophagus with long segment Barrett's. For centimeter ulcer in the distal esophagus. 6 cm hiatal hernia. Mild gastritis. Normal duodenum. EUS showed evidence of mass in the distal esophagus. Lymph nodes noted adjacent to the masslike area. Fine-needle biopsy of wall thickening/masslike area of the esophagus performed. Endoscopic biopsies performed of the ulcer. The pathology and cytology to be rushed.  Patient will be placed on a full liquid diet.  [facility] will follow up with the patient tomorrow.  Justice Britain, MD [facility] Office # [phone]

## 2021-06-28 NOTE — Anesthesia Postprocedure Evaluation (Signed)
Anesthesia Post Note  Patient: CODI KERTZ  Procedure(s) Performed: UPPER ESOPHAGEAL ENDOSCOPIC ULTRASOUND (EUS) FINE NEEDLE ASPIRATION (FNA) LINEAR BIOPSY     Patient location during evaluation: PACU Anesthesia Type: MAC Level of consciousness: awake and alert Pain management: pain level controlled Vital Signs Assessment: post-procedure vital signs reviewed and stable Respiratory status: spontaneous breathing, nonlabored ventilation, respiratory function stable and patient connected to nasal cannula oxygen Cardiovascular status: stable and blood pressure returned to baseline Postop Assessment: no apparent nausea or vomiting Anesthetic complications: no   No notable events documented.  Last Vitals:  Vitals:   06/28/21 1700 06/28/21 1710  BP: (!) 151/50 (!) 142/54  Pulse: (!) 48 (!) 52  Resp: 14 (!) 22  Temp:    SpO2: 100% 100%    Last Pain:  Vitals:   06/28/21 1710  TempSrc:   PainSc: 0-No pain                 Tiajuana Amass

## 2021-06-28 NOTE — Anesthesia Procedure Notes (Signed)
Procedure Name: MAC Date/Time: 06/28/2021 3:39 PM Performed by: Cynda Familia, CRNA Pre-anesthesia Checklist: Patient identified, Emergency Drugs available, Suction available, Patient being monitored and Timeout performed Patient Re-evaluated:Patient Re-evaluated prior to induction Oxygen Delivery Method: Simple face mask Placement Confirmation: positive ETCO2 and breath sounds checked- equal and bilateral Dental Injury: Teeth and Oropharynx as per pre-operative assessment

## 2021-06-28 NOTE — Progress Notes (Signed)
[facility] Gastroenterology Progress Note  Lee Wilson 75 y.o. 1946-08-25  CC:  Esophageal Mass   Subjective: Patient has no complaints.  NPO at midnight.  Lovenox was held yesterday evening in anticipation of the procedure today.  Denies melena/hematochezia.  Denies nausea, vomiting, abdominal pain.   ROS : Review of Systems  Cardiovascular:  Negative for chest pain and palpitations.  Gastrointestinal:  Negative for abdominal pain, blood in stool, constipation, diarrhea, heartburn, melena, nausea and vomiting.     Objective: Vital signs in last 24 hours: Vitals:   06/28/21 0458 06/28/21 1046  BP: 122/77   Pulse: 66   Resp: 18   Temp: 98.6 F (37 C)   SpO2: 100% 100%   Physical Exam Constitutional:      General: He is not in acute distress.    Appearance: He is underweight.  Abdominal:     General: Abdomen is flat. Bowel sounds are normal. There is no distension.     Palpations: Abdomen is soft.     Tenderness: There is no abdominal tenderness. There is no guarding or rebound.  Skin:    Coloration: Skin is not jaundiced.  Neurological:     General: No focal deficit present.     Mental Status: He is alert and oriented to person, place, and time.  Psychiatric:        Mood and Affect: Mood normal.        Behavior: Behavior normal.      Lab Results: Recent Labs    06/26/21 1704  NA 140  K 3.8  CL 103  CO2 27  GLUCOSE 95  BUN 12  CREATININE 0.86  CALCIUM 9.2    Recent Labs    06/26/21 1704  AST 18  ALT 9  ALKPHOS 47  BILITOT 0.7  PROT 6.9  ALBUMIN 3.5    Recent Labs    06/26/21 1704  WBC 6.0  NEUTROABS 3.9  HGB 11.6*  HCT 33.3*  MCV 96.5  PLT 185    Recent Labs    06/26/21 1704  LABPROT 12.8  INR 1.0      Assessment/Plan Esophageal mass Patient has had 2 inconclusive endoscopies Significant findings on CAT scan and PET scan. S/p 2 radiation sessions - EUS with Dr. Rush Landmark today Risk and benefits of the procedure and any  questions about the procedure discussed with the patient.   [facility] will follow  Vladimir Crofts PA-C 06/28/2021, 12:36 PM  Contact #  [phone]

## 2021-06-28 NOTE — Transfer of Care (Signed)
Immediate Anesthesia Transfer of Care Note  Patient: Lee Wilson  Procedure(s) Performed: UPPER ESOPHAGEAL ENDOSCOPIC ULTRASOUND (EUS) FINE NEEDLE ASPIRATION (FNA) LINEAR BIOPSY  Patient Location: PACU and Endoscopy Unit  Anesthesia Type:MAC  Level of Consciousness: sedated  Airway & Oxygen Therapy: Patient Spontanous Breathing and Patient connected to face mask oxygen  Post-op Assessment: Report given to RN and Post -op Vital signs reviewed and stable  Post vital signs: Reviewed and stable  Last Vitals:  Vitals Value Taken Time  BP 135/48 06/28/21 1653  Temp    Pulse 50 06/28/21 1654  Resp 18 06/28/21 1654  SpO2 100 % 06/28/21 1654  Vitals shown include unvalidated device data.  Last Pain:  Vitals:   06/28/21 1322  TempSrc: Oral  PainSc: 0-No pain         Complications: No notable events documented.

## 2021-06-28 NOTE — Progress Notes (Signed)
HEMATOLOGY-ONCOLOGY PROGRESS NOTE  SUBJECTIVE: He has persistent dysphagia and anterior chest pain.  He is tolerating adequate.  PHYSICAL EXAMINATION:  Vitals:   06/27/21 2019 06/28/21 0458  BP: 112/69 122/77  Pulse: (!) 56 66  Resp: 18 18  Temp: 98.5 F (36.9 C) 98.6 F (37 C)  SpO2: 99% 100%   Filed Weights   06/26/21 1522  Weight: 88 lb 13.5 oz (40.3 kg)    Intake/Output from previous day: 09/13 0701 - 09/14 0700 In: 2571.6 [P.O.:715; I.V.:1856.6] Out: -   GENERAL:alert, no distress and comfortable OROPHARYNX: No thrush LUNGS: clear to auscultation and percussion with normal breathing effort HEART: regular rate & rhythm and no murmurs and no lower extremity edema ABDOMEN:abdomen soft, non-tender and normal bowel sounds NEURO: alert & oriented x 3 with fluent speech, no focal motor/sensory deficits  LABORATORY DATA:  I have reviewed the data as listed CMP Latest Ref Rng & Units 06/26/2021 06/07/2021 05/15/2021  Glucose 70 - 99 mg/dL 95 206(H) 92  BUN 8 - 23 mg/dL '12 20 22  '$ Creatinine 0.61 - 1.24 mg/dL 0.86 1.00 1.05  Sodium 135 - 145 mmol/L 140 135 140  Potassium 3.5 - 5.1 mmol/L 3.8 3.5 3.7  Chloride 98 - 111 mmol/L 103 95(L) 99  CO2 22 - 32 mmol/L '27 27 25  '$ Calcium 8.9 - 10.3 mg/dL 9.2 9.3 9.1  Total Protein 6.5 - 8.1 g/dL 6.9 7.5 7.1  Total Bilirubin 0.3 - 1.2 mg/dL 0.7 0.7 0.5  Alkaline Phos 38 - 126 U/L 47 46 -  AST 15 - 41 U/L '18 16 19  '$ ALT 0 - 44 U/L '9 9 14    '$ Lab Results  Component Value Date   WBC 6.0 06/26/2021   HGB 11.6 (L) 06/26/2021   HCT 33.3 (L) 06/26/2021   MCV 96.5 06/26/2021   PLT 185 06/26/2021   NEUTROABS 3.9 06/26/2021    No results found.  ASSESSMENT AND PLAN: Distal esophagus mass Mass at 30 cm on EGD 05/08/2021-biopsy negative for malignancy CT abdomen/pelvis 04/24/2021-masslike thickening of the distal esophagus, T12 compression fracture-age-indeterminate PET scan 05/26/2021-mid to lower esophageal mass is hypermetabolic.  Small  subcarinal lymph node with borderline FDG uptake. Repeat endoscopy/biopsy 05/31/2021-we do not have a copy of the procedure report.  Pathology revealed no malignancy 2.Solid/liquid dysphagia secondary to #1 3.Chronic back pain 4.T12 compression fracture-likely benign 5. History of heavy alcohol use 6. Remote history of tobacco use 7. History of gastroesophageal reflux disease 8. Barrett's esophagus on EGD 05/08/2021 9. Weight loss secondary to #1 10.  Normocytic anemia  Mr. Counselman appears stable.  He is scheduled for an EGD/EUS today.  Recommendations: 1.  Soft diet as tolerated 2.  Continue home medications and as needed pain medication 3.  EGD/EUS today 4.  Discharge to home the following day EGD today or in the a.m. 06/29/2021   LOS: 2 days   Betsy Coder, MD 06/28/21

## 2021-06-28 NOTE — Interval H&P Note (Signed)
History and Physical Interval Note:  06/28/2021 2:48 PM  Lee Wilson  has presented today for surgery, with the diagnosis of Esophageal mass.  The various methods of treatment have been discussed with the patient and family. After consideration of risks, benefits and other options for treatment, the patient has consented to  Procedure(s): UPPER ESOPHAGEAL ENDOSCOPIC ULTRASOUND (EUS) (N/A) as a surgical intervention.  The patient's history has been reviewed, patient examined, no change in status, stable for surgery.  I have reviewed the patient's chart and labs.  Questions were answered to the patient's satisfaction.     [employer]

## 2021-06-28 NOTE — H&P (View-Only) (Signed)
[facility] Gastroenterology Progress Note  Lee Wilson 75 y.o. 09-May-1946  CC:  Esophageal Mass   Subjective: Patient has no complaints.  NPO at midnight.  Lovenox was held yesterday evening in anticipation of the procedure today.  Denies melena/hematochezia.  Denies nausea, vomiting, abdominal pain.   ROS : Review of Systems  Cardiovascular:  Negative for chest pain and palpitations.  Gastrointestinal:  Negative for abdominal pain, blood in stool, constipation, diarrhea, heartburn, melena, nausea and vomiting.     Objective: Vital signs in last 24 hours: Vitals:   06/28/21 0458 06/28/21 1046  BP: 122/77   Pulse: 66   Resp: 18   Temp: 98.6 F (37 C)   SpO2: 100% 100%   Physical Exam Constitutional:      General: He is not in acute distress.    Appearance: He is underweight.  Abdominal:     General: Abdomen is flat. Bowel sounds are normal. There is no distension.     Palpations: Abdomen is soft.     Tenderness: There is no abdominal tenderness. There is no guarding or rebound.  Skin:    Coloration: Skin is not jaundiced.  Neurological:     General: No focal deficit present.     Mental Status: He is alert and oriented to person, place, and time.  Psychiatric:        Mood and Affect: Mood normal.        Behavior: Behavior normal.      Lab Results: Recent Labs    06/26/21 1704  NA 140  K 3.8  CL 103  CO2 27  GLUCOSE 95  BUN 12  CREATININE 0.86  CALCIUM 9.2    Recent Labs    06/26/21 1704  AST 18  ALT 9  ALKPHOS 47  BILITOT 0.7  PROT 6.9  ALBUMIN 3.5    Recent Labs    06/26/21 1704  WBC 6.0  NEUTROABS 3.9  HGB 11.6*  HCT 33.3*  MCV 96.5  PLT 185    Recent Labs    06/26/21 1704  LABPROT 12.8  INR 1.0      Assessment/Plan Esophageal mass Patient has had 2 inconclusive endoscopies Significant findings on CAT scan and PET scan. S/p 2 radiation sessions - EUS with Dr. Rush Landmark today Risk and benefits of the procedure and any  questions about the procedure discussed with the patient.   [facility] will follow  Vladimir Crofts PA-C 06/28/2021, 12:36 PM  Contact #  [phone]

## 2021-06-29 ENCOUNTER — Ambulatory Visit: Payer: Medicare Other

## 2021-06-29 ENCOUNTER — Other Ambulatory Visit: Payer: Self-pay | Admitting: *Deleted

## 2021-06-29 ENCOUNTER — Other Ambulatory Visit: Payer: Self-pay | Admitting: Radiation Oncology

## 2021-06-29 ENCOUNTER — Encounter (HOSPITAL_COMMUNITY): Payer: Self-pay | Admitting: Gastroenterology

## 2021-06-29 DIAGNOSIS — M545 Low back pain, unspecified: Secondary | ICD-10-CM | POA: Diagnosis not present

## 2021-06-29 DIAGNOSIS — K2289 Other specified disease of esophagus: Secondary | ICD-10-CM

## 2021-06-29 DIAGNOSIS — C159 Malignant neoplasm of esophagus, unspecified: Secondary | ICD-10-CM

## 2021-06-29 DIAGNOSIS — R599 Enlarged lymph nodes, unspecified: Secondary | ICD-10-CM

## 2021-06-29 DIAGNOSIS — G8929 Other chronic pain: Secondary | ICD-10-CM | POA: Diagnosis not present

## 2021-06-29 DIAGNOSIS — K229 Disease of esophagus, unspecified: Secondary | ICD-10-CM | POA: Diagnosis not present

## 2021-06-29 DIAGNOSIS — Z79899 Other long term (current) drug therapy: Secondary | ICD-10-CM

## 2021-06-29 MED ORDER — DEXAMETHASONE 2 MG PO TABS: ORAL_TABLET | ORAL | 0 refills | Status: DC

## 2021-06-29 MED ORDER — ADULT MULTIVITAMIN W/MINERALS CH: 1.0000 | ORAL_TABLET | Freq: Every day | ORAL | Status: AC

## 2021-06-29 MED ORDER — HYDROCODONE-ACETAMINOPHEN 7.5-325 MG/15ML PO SOLN: 10.0000 mL | Freq: Four times a day (QID) | ORAL | 0 refills | Status: AC | PRN

## 2021-06-29 MED ORDER — RESOURCE INSTANT PROTEIN PO PWD PACKET: 6.0000 g | Freq: Three times a day (TID) | ORAL | Status: AC

## 2021-06-29 NOTE — Progress Notes (Signed)
Per Dr. Benay Spice: Schedule for OV/1st Taxol/Carbo on 07/05/21. Scheduling message sent to get appointment on his discharge papers. Called in dexamethasone to take night before and am of 1st chemo.

## 2021-06-29 NOTE — Discharge Instructions (Signed)
Dr. Benay Spice is planning your first treatment for 07/05/21-scheduling will call. A new script was sent to your pharmacy to take dexamethasone 10 mg at bedtime night before 1st Taxol and at 6 am day of 1st Taxol.

## 2021-06-29 NOTE — Op Note (Signed)
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                            Sherrill Medicines:                Monitored Anesthesia Care Complications:            No immediate complications. Estimated Blood Loss:     Estimated blood loss was minimal. Procedure:                Pre-Anesthesia Assessment:                           - Prior to the procedure, a History and Physical                            was performed, and patient medications and                            allergies were reviewed. The patient's tolerance of                            previous anesthesia was also reviewed. The risks                            and benefits of the procedure and the sedation                            options and risks were discussed with the patient.                            All questions were answered, and informed consent                            was obtained. Prior Anticoagulants: The patient has                            taken Lovenox (enoxaparin), last   dose was 1 day                            prior to procedure. ASA Grade Assessment: III - A                            patient with severe systemic disease. After                            reviewing the risks and benefits, the patient was                            deemed in satisfactory condition to undergo the                            procedure.                            After obtaining informed consent, the endoscope was                            passed under direct vision. Throughout the                            procedure, the patient's blood pressure, pulse, and                            oxygen saturations were monitored continuously. The                            [id number]-H190 ([id number]) Olympus endoscope was introduced                            through the mouth, and advanced to the second part                            of duodenum. The GF-UE190-AL5 ([id number]) Olympus                            radial ultrasound scope was introduced through the                            mouth, and advanced to the duodenum for ultrasound                            examination from the stomach and duodenum. The                            GF-[id number] ([id number]) Olympus linear ultrasound scope                            was introduced through the mouth, and advanced to                              the duodenum for ultrasound examination from the                            stomach and duodenum. The upper EUS was                            accomplished without difficulty. The patient                            tolerated the procedure. Scope In: Scope Out: Findings:      ENDOSCOPIC FINDING: :      The Z-line was irregular and was found 20 cm from the incisors.      Circumferential salmon-colored mucosa was present from 20 to 35 cm.      One cratered esophageal ulcer taking up greater than 50% of the mucosal       wall was found within the Barrett's esophagus from 29 to 33 cm from the       incisors. The edges were heaped up and the cratered center was hard.       Biopsies were taken with a cold forceps for histology after the EUS was       completed.      A 6 cm hiatal hernia was present.      Patchy mildly erythematous mucosa without bleeding was found in the       entire examined stomach - previously biopsied per report.      No gross lesions were  noted in the duodenal bulb, in the first portion       of the duodenum and in the second portion of the duodenum.      ENDOSONOGRAPHIC FINDING: :      A homogenous mass-like region was found in the thoracic esophagus at the       area of the ulcer. This was encountered at 29 cm from the incisors and       extended to 33 cm. The lesion was partially circumferential (involving       70% of the lumen). The endosonographic borders were irregular. It       measured up to 8 mm in thickness. There was sonographic evidence       suggesting invasion through the muscularis propria (Layer 4). Fine       needle biopsy was performed of the thickened area to try to gain further       tissue. Color Doppler imaging was utilized prior to needle puncture to       confirm a lack of significant vascular structures within the needle       path. Five passes were made with the Acquire 25 gauge ultrasound core       biopsy needle using a transesophageal approach. Visible cores of tissue       were obtained. Final cytology results are pending.      There was no sign of significant endosonographic abnormality in the       cervical esophagus. No wall thickening was identified.      Endosonographic images of the proximal cardia of the stomach were       unremarkable. No wall thickening was identified.      Two enlarged lymph nodes were visualized in the lower paraesophageal       mediastinum (level 8L) with the ultrasound probe located 30 cm  from the       incisors. The largest measured 4 mm by 10 mm in maximal cross-sectional       diameter. The nodes were irregular, hypoechoic and had well defined       margins. Sampling was not performed as it would have required going       through the mass-like region to reach these lymph nodes      Endosonographic imaging in the visualized portion of the liver showed no       mass.      The celiac region was visualized. Impression:               EGD Impression:                            - Z-line irregular, 20 cm from the incisors.                           - Salmon-colored mucosa consistent with                            long-segment Barrett's esophagus.                           - Esophageal ulcer within Barrett's - biopsied.                           - 6 cm hiatal hernia.                           - Erythematous mucosa in the stomach.                           - No gross lesions in the duodenal bulb, in the                            first portion of the duodenum and in the second                            portion of the duodenum.                           EUS Impression:                           - A mass-like area was found in the thoracic                            esophagus. Tissue was obtained from this exam, and                            results are pending. However, the endosonographic                            appearance is suspicious for adenocarcinoma. This                              was staged T3 N1 Mx by endosonographic criteria.                            The staging applies if malignancy is confirmed.                            Fine needle biopsy performed of the mass.                           - There was no sign of significant pathology in the                            cervical esophagus.                           - Endosonographic images of the proximal stomach                            were unremarkable.                           - Two enlarged lymph nodes were visualized in the                            lower paraesophageal mediastinum (level 8L) -                            however to reach these would have required going                            through the mass-like region so they were not                            sampled.                           Overall, this non-healing ulcer is certainly                            concerning for an underlying malignancy. It's                            characteristics endoscopically do  not have typical                            appearance of severe esophageal mass but the EUS                            imaging is concerning. Hopefully tissue will shed                            some light on this situation for patient. Moderate Sedation:      Not Applicable - Patient had care per Anesthesia. Recommendation:           - The patient   will be observed post-procedure,                            until all discharge criteria are met.                           - Return patient to hospital ward for ongoing care.                           - Full liquid diet.                           - Observe patient's clinical course.                           - Likely will need PEG tube feeding due to                            significant weight loss.                           - PPI BID - Liquid ideal.                           - Carafate QAC + QHS - Liquid ideal.                           - Await cytology results and await path results.                           - Hold Lovenox restart for VTE PPx for 36-48 hours.                           - The findings and recommendations were discussed                            with the patient. Procedure Code(s):        --- Professional ---                           [phone], Esophagogastroduodenoscopy, flexible,                            transoral; with transendoscopic ultrasound-guided                            intramural or transmural fine needle                            aspiration/biopsy(s) (includes endoscopic                            ultrasound examination of the esophagus, stomach,                            and either the duodenum or a surgically altered  stomach where the jejunum is examined distal to the                            anastomosis) Diagnosis Code(s):        --- Professional ---                           K22.8, Other specified diseases of esophagus                           K22.10, Ulcer of esophagus  without bleeding                           K44.9, Diaphragmatic hernia without obstruction or                            gangrene                           K31.89, Other diseases of stomach and duodenum                           R59.0, Localized enlarged lymph nodes                           R93.5, Abnormal findings on diagnostic imaging of                            other abdominal regions, including retroperitoneum CPT copyright 2019 [employer]. All rights reserved. The codes documented in this report are preliminary and upon coder review may  be revised to meet current compliance requirements. Justice Britain, MD 06/29/2021 3:41:51 AM Number of Addenda: 0

## 2021-06-29 NOTE — Progress Notes (Addendum)
Discharge orders placed for pt.  Pt aware. States family will be on their way to pick him up. Paperwork printed and reviewed with pt.  PIVs removed.   Pt with no needs at home.

## 2021-06-29 NOTE — Plan of Care (Signed)

## 2021-06-29 NOTE — Progress Notes (Signed)
Subjective: Patient is being discharged today. He states that he is able to tolerate soft foods like ice cream and soups.  Objective: Vital signs in last 24 hours: Temp:  [97 F (36.1 C)-98.2 F (36.8 C)] 98.2 F (36.8 C) (09/15 0552) Pulse Rate:  [48-67] 66 (09/15 0552) Resp:  [10-22] 16 (09/15 0552) BP: (93-164)/(48-69) 93/54 (09/15 0552) SpO2:  [97 %-100 %] 97 % (09/15 0851) Weight:  [39.9 kg] 39.9 kg (09/14 1322) Weight change:  Last BM Date: 06/26/21  PE: Thin, not in distress GENERAL: Mild pallor, no icterus  ABDOMEN: Nondistended EXTREMITIES: No deformity  Lab Results: No results found for this or any previous visit (from the past 48 hour(s)).  Studies/Results: No results found.  Medications: I have reviewed the patient's current medications.  Assessment: Status post EGD and EUS by Dr. Rush Landmark yesterday. Noted to have long segment Barrett's esophagus, 4 cm ulcer in distal esophagus, 6 cm hiatal hernia, lymph node adjacent to masslike area, status post FNA of wall thickening and masslike area of esophagus and endoscopic biopsies of the ulcer Pathology and cytology to be sent as rush  Plan: Biopsies will be followed as an outpatient with Dr. Alessandra Bevels. Patient states he is able to tolerate full liquids. Discussed about possible feeding tube placement if there is failure to thrive or severe malnutrition or severe dysphagia on follow-up.  Ronnette Juniper, MD 06/29/2021, 10:38 AM

## 2021-06-29 NOTE — Discharge Summary (Addendum)
Physician Discharge Summary  Patient ID: Lee Wilson MRN: [id number] DOB/AGE: 1946/05/06 75 y.o.  Admit date: 06/26/2021 Discharge date: 06/29/2021  Discharge Diagnoses:  Active Problems:   Esophageal mass   Dysphagia   Dehydration   Protein-calorie malnutrition, severe  Discharged Condition: good  Discharge Labs:  CBC    Component Value Date/Time   WBC 6.0 06/26/2021 1704   RBC 3.45 (L) 06/26/2021 1704   HGB 11.6 (L) 06/26/2021 1704   HGB 12.5 (L) 06/07/2021 0750   HCT 33.3 (L) 06/26/2021 1704   PLT 185 06/26/2021 1704   PLT 283 06/07/2021 0750   MCV 96.5 06/26/2021 1704   MCH 33.6 06/26/2021 1704   MCHC 34.8 06/26/2021 1704   RDW 14.4 06/26/2021 1704   LYMPHSABS 1.5 06/26/2021 1704   MONOABS 0.5 06/26/2021 1704   EOSABS 0.1 06/26/2021 1704   BASOSABS 0.0 06/26/2021 1704   CMP Latest Ref Rng & Units 06/26/2021 06/07/2021 05/15/2021  Glucose 70 - 99 mg/dL 95 206(H) 92  BUN 8 - 23 mg/dL '12 20 22  '$ Creatinine 0.61 - 1.24 mg/dL 0.86 1.00 1.05  Sodium 135 - 145 mmol/L 140 135 140  Potassium 3.5 - 5.1 mmol/L 3.8 3.5 3.7  Chloride 98 - 111 mmol/L 103 95(L) 99  CO2 22 - 32 mmol/L '27 27 25  '$ Calcium 8.9 - 10.3 mg/dL 9.2 9.3 9.1  Total Protein 6.5 - 8.1 g/dL 6.9 7.5 7.1  Total Bilirubin 0.3 - 1.2 mg/dL 0.7 0.7 0.5  Alkaline Phos 38 - 126 U/L 47 46 -  AST 15 - 41 U/L '18 16 19  '$ ALT 0 - 44 U/L '9 9 14   '$ Consults: GI  Procedures: 06/28/2021 EUS-masslike area in the thoracic esophagus, this was biopsied, endosonographic appearance suspicious for adenocarcinoma, stage T3N1, 2 enlarged lymph nodes visualized in the lower paraesophageal mediastinum  Disposition: Discharge disposition: 01-Home or Self Care      Allergies as of 06/29/2021       Reactions   Penicillin G Other (See Comments)   Unknown childhood reaction        Medication List     TAKE these medications    albuterol 108 (90 Base) MCG/ACT inhaler Commonly known as: VENTOLIN HFA Inhale 2 puffs into the  lungs every 4 (four) hours as needed for wheezing or shortness of breath.   cetirizine 10 MG tablet Commonly known as: ZYRTEC Take 1 tablet (10 mg total) by mouth daily.   dexamethasone 2 MG tablet Commonly known as: DECADRON Take 10 mg (5 tabs) at 10pm night prior to first chemo and 6am morning of first chemo   diclofenac Sodium 1 % Gel Commonly known as: Voltaren Apply to back three times as needed for pain What changed:  how much to take how to take this when to take this reasons to take this additional instructions   Ensure Take 237 mLs by mouth See admin instructions. Strawberry flavor - drink one to two cans by mouth daily   HYDROcodone-acetaminophen 7.5-325 mg/15 ml solution Commonly known as: HYCET Take 10 mLs by mouth every 6 (six) hours as needed for moderate pain.   hydrocortisone 2.5 % cream Apply topically 2 (two) times daily. Apply to face What changed:  how much to take when to take this reasons to take this additional instructions   lansoprazole 30 MG capsule Commonly known as: PREVACID Take 1 capsule (30 mg total) by mouth daily at 12 noon. Open capsule and sprinkle in applesauce, pudding or yogurt. Do  not crush medication What changed:  when to take this additional instructions   levothyroxine 88 MCG tablet Commonly known as: SYNTHROID Take 1 tablet (88 mcg total) by mouth daily before breakfast.   lidocaine 5 % Commonly known as: Lidoderm Place 1 patch onto the skin daily. Remove & Discard patch within 12 hours or as directed by MD   multivitamin with minerals Tabs tablet Take 1 tablet by mouth daily.   prochlorperazine 5 MG tablet Commonly known as: COMPAZINE Take 1 tablet (5 mg total) by mouth every 6 (six) hours as needed for nausea or vomiting.   protein supplement 6 g Powd Commonly known as: RESOURCE BENEPROTEIN Take 1 Scoop (6 g total) by mouth 3 (three) times daily with meals.   simvastatin 80 MG tablet Commonly known as:  ZOCOR Take 1 tablet (80 mg total) by mouth at bedtime. What changed: when to take this   umeclidinium bromide 62.5 MCG/INH Aepb Commonly known as: INCRUSE ELLIPTA Inhale 1 puff into the lungs daily.        HPI:  Lee Wilson is a 75 year old male with a past medical history significant for chronic back pain, T12 compression fracture, history of heavy alcohol use, remote history of tobacco use, GERD, Barrett's esophagus.  He was noted to have a distal esophageal mass on EGD from 05/08/2021.  Biopsy negative for malignancy.  PET scan from 05/26/2021 showed that the mid to lower esophageal mass was hypermetabolic and small subcarinal lymph node had borderline FDG uptake.  A repeat endoscopy/biopsy on 05/31/2021 was also nondiagnostic.  He has had persistent difficulty with dysphagia for solids and liquids.  He has been taking liquid pain medication which has been effective for him.   The patient tells me that he had some difficulty with chest pain last night and vomited.  He has a lot of difficulty swallowing liquids and pills.  However, he tells me that he can eat soft foods.  He otherwise is not having any complaints today.  The patient is being admitted today to expedite work-up with repeat EGD/EUS for further evaluation of his esophageal mass.  Hospital Course: Lee Wilson was seen by GI during this hospital admission.  He underwent EUS on 06/28/2021.  Results are listed above.  Biopsy is currently pending.  He has been tolerating a soft diet.  He reports mild dysphagia.  Pain is controlled with liquid hydrocodone.  He was seen on the morning of 06/29/2021 and was overall stable.  His exam remain unchanged. He will be discharged home and will continue his home medications.  He will also continue on hydrocodone elixir.  A refill was provided to him today.  Outpatient follow-up will be scheduled cancer center next week to review the biopsy results and to consider initiation of  chemotherapy.  Discharge Instructions     Diet full liquid   Complete by: As directed    May eat soft foods as tolerated.   Increase activity slowly   Complete by: As directed        Signed: Mikey Bussing 06/29/2021, 9:43 AM   Lee Wilson was interviewed and examined on the day of discharge.  He appears stable for discharge to home.  I reviewed the EGD/EUS report and discussed the findings with Mr. Magnotta.  We will follow-up on the pathology report.  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  The plan is to initiate weekly Taxol/carboplatin chemotherapy and radiation if the biopsy confirms a malignancy.  He will be scheduled  for follow-up at the Cancer center next week.  I was present for greater than 50% of today's visit.  I performed medical decision making.

## 2021-06-30 ENCOUNTER — Telehealth: Payer: Self-pay

## 2021-06-30 ENCOUNTER — Telehealth: Payer: Self-pay | Admitting: Gastroenterology

## 2021-06-30 ENCOUNTER — Encounter: Payer: Self-pay | Admitting: Gastroenterology

## 2021-06-30 ENCOUNTER — Ambulatory Visit: Payer: Medicare Other

## 2021-06-30 DIAGNOSIS — K2210 Ulcer of esophagus without bleeding: Secondary | ICD-10-CM

## 2021-06-30 LAB — CYTOLOGY - NON PAP

## 2021-06-30 MED ORDER — LANSOPRAZOLE 30 MG PO CPDR: 30.0000 mg | DELAYED_RELEASE_CAPSULE | Freq: Two times a day (BID) | ORAL | 6 refills | Status: DC

## 2021-06-30 MED ORDER — SUCRALFATE 1 GM/10ML PO SUSP: 1.0000 g | Freq: Three times a day (TID) | ORAL | 3 refills | Status: DC

## 2021-06-30 NOTE — Telephone Encounter (Signed)
Thanks for keeping me in the loop and for the great care you all are providing this patient.

## 2021-06-30 NOTE — Telephone Encounter (Signed)
Transition Care Management Follow-up Telephone Call Date of discharge and from where: 06/29/2021  [facility] How have you been since you were released from the hospital? Doing ok. Reports able to eat dinner last night and reports drinking well Any questions or concerns? No  Items Reviewed: Did the pt receive and understand the discharge instructions provided? yes Medications obtained and verified? Yes  Other? Yes   Reports he does not have 1 inhaler due to cost Any new allergies since your discharge? No  Dietary orders reviewed? Yes Do you have support at home? Yes   Home Care and Equipment/Supplies: Were home health services ordered? no If so, what is the name of the agency?  Has the agency set up a time to come to the patient's home? not applicable Were any new equipment or medical supplies ordered?  No What is the name of the medical supply agency?  Were you able to get the supplies/equipment? not applicable Do you have any questions related to the use of the equipment or supplies? No  Functional Questionnaire: (I = Independent and D = Dependent) ADLs: I  Bathing/Dressing- I  Meal Prep- I  Eating- I  Maintaining continence- I  Transferring/Ambulation- I  Managing Meds- I  Follow up appointments reviewed:  PCP Hospital f/u appt confirmed? No   Specialist Hospital f/u appt confirmed? Yes  Scheduled to see Radiation Onocology on 07/05/1999 Are transportation arrangements needed? No  If their condition worsens, is the pt aware to call PCP or go to the Emergency Dept.? Yes Was the patient provided with contact information for the PCP's office or ED? Yes Was to pt encouraged to call back with questions or concerns? Yes  Tomasa Rand, RN, BSN, CEN [facility] [employer] [phone]

## 2021-06-30 NOTE — Telephone Encounter (Signed)
I have called and spoken with the patient this morning. He has been updated about the results of the EGD biopsies as well as the FNA biopsies of the esophageal ulcer/esophageal thickening/mass. No evidence of malignancy. Viral stains are pending. Patient states he is eating and drinking at this time well. He has not checked his weight however. I have asked the patient to try and be aggressive with some acid suppression and ulcer healing medications.  I have sent a prescription for Prevacid 30 mg twice daily and a prescription for Carafate 1 g 4 times daily.  If this were to be a nonmalignant ulcer, this should help heal this area somewhat within the next few weeks.  If it turns out to be a malignant ulcer, then it would not likely heal.  The patient understands as well that if his weight continues to decrease that his primary team may need him to get a feeding tube, that would be temporary, but only for supplementation to increase his weight if necessary.  I will forward this information to the patient's primary [facility] gastroenterologist as well as to his oncologist and radiation oncologist.  It seems a we will be holding on chemo radiation for now which I think is not unreasonable as we try to be extremely aggressive with acid suppression and ulcer healing.  Further work-up and management and evaluation will be done by the [facility] GI team.  Justice Britain, MD [facility]oscopy Office # [phone]

## 2021-07-03 ENCOUNTER — Ambulatory Visit: Payer: Medicare Other

## 2021-07-03 ENCOUNTER — Telehealth: Payer: Self-pay | Admitting: *Deleted

## 2021-07-03 LAB — SURGICAL PATHOLOGY

## 2021-07-03 NOTE — Telephone Encounter (Signed)
Per Dr. Benay Spice: Cancel lab/OV on 9/21 and f/u prn. Final path (#3) shows no cancer. He will f/u with GI for management. Patient notified.

## 2021-07-04 ENCOUNTER — Telehealth: Payer: Self-pay | Admitting: *Deleted

## 2021-07-04 ENCOUNTER — Ambulatory Visit: Payer: Medicare Other

## 2021-07-04 NOTE — Telephone Encounter (Signed)
Patient called to inquire if he needs to have the radiation treatment. Informed him that he does not need chemo or radiation therapy since he does not have cancer per biopsy x 3. His GI physician, Dr. Rush Landmark will follow the esophagus issue. Provided him the phone number to call office to determine what his follow up there consists of.

## 2021-07-05 ENCOUNTER — Inpatient Hospital Stay: Payer: Medicare Other | Admitting: Nurse Practitioner

## 2021-07-05 ENCOUNTER — Inpatient Hospital Stay: Payer: Medicare Other

## 2021-07-05 ENCOUNTER — Ambulatory Visit: Payer: Medicare Other

## 2021-07-06 ENCOUNTER — Inpatient Hospital Stay: Payer: Medicare Other

## 2021-07-06 ENCOUNTER — Ambulatory Visit: Payer: Medicare Other

## 2021-07-07 ENCOUNTER — Ambulatory Visit: Payer: Medicare Other

## 2021-07-07 ENCOUNTER — Encounter: Payer: Medicare Other | Admitting: Dietician

## 2021-07-10 ENCOUNTER — Ambulatory Visit: Payer: Medicare Other

## 2021-07-11 ENCOUNTER — Ambulatory Visit: Payer: Medicare Other

## 2021-07-12 ENCOUNTER — Ambulatory Visit: Payer: Medicare Other

## 2021-07-13 ENCOUNTER — Ambulatory Visit: Payer: Medicare Other

## 2021-07-13 ENCOUNTER — Other Ambulatory Visit: Payer: Medicare Other

## 2021-07-14 ENCOUNTER — Ambulatory Visit: Payer: Medicare Other

## 2021-07-14 ENCOUNTER — Encounter: Payer: Medicare Other | Admitting: Dietician

## 2021-07-17 ENCOUNTER — Ambulatory Visit: Payer: Medicare Other

## 2021-07-18 ENCOUNTER — Ambulatory Visit: Payer: Medicare Other

## 2021-07-19 ENCOUNTER — Ambulatory Visit: Payer: Medicare Other

## 2021-07-20 ENCOUNTER — Ambulatory Visit: Payer: Medicare Other

## 2021-07-21 ENCOUNTER — Ambulatory Visit: Payer: Medicare Other | Admitting: Dietician

## 2021-07-21 ENCOUNTER — Ambulatory Visit: Payer: Medicare Other

## 2021-07-21 NOTE — Progress Notes (Signed)
Nutrition  Patient did not show for scheduled nutrition appointment. 

## 2021-07-24 ENCOUNTER — Ambulatory Visit: Payer: Medicare Other

## 2021-07-25 ENCOUNTER — Ambulatory Visit: Payer: Medicare Other

## 2021-07-25 ENCOUNTER — Encounter: Payer: Medicare Other | Admitting: Dietician

## 2021-07-26 ENCOUNTER — Ambulatory Visit: Payer: Medicare Other

## 2021-07-27 ENCOUNTER — Ambulatory Visit: Payer: Medicare Other

## 2021-07-28 ENCOUNTER — Ambulatory Visit: Payer: Medicare Other

## 2021-07-31 ENCOUNTER — Ambulatory Visit: Payer: Medicare Other

## 2021-08-01 ENCOUNTER — Ambulatory Visit: Payer: Medicare Other

## 2021-08-02 ENCOUNTER — Ambulatory Visit: Payer: Medicare Other

## 2021-08-03 ENCOUNTER — Ambulatory Visit: Payer: Medicare Other

## 2021-08-04 ENCOUNTER — Ambulatory Visit: Payer: Medicare Other

## 2021-08-28 ENCOUNTER — Ambulatory Visit (INDEPENDENT_AMBULATORY_CARE_PROVIDER_SITE_OTHER): Payer: Medicare Other | Admitting: Gastroenterology

## 2021-08-28 ENCOUNTER — Encounter (INDEPENDENT_AMBULATORY_CARE_PROVIDER_SITE_OTHER): Payer: Self-pay

## 2021-08-28 ENCOUNTER — Other Ambulatory Visit: Payer: Self-pay

## 2021-08-28 ENCOUNTER — Other Ambulatory Visit (INDEPENDENT_AMBULATORY_CARE_PROVIDER_SITE_OTHER): Payer: Self-pay

## 2021-08-28 ENCOUNTER — Encounter (INDEPENDENT_AMBULATORY_CARE_PROVIDER_SITE_OTHER): Payer: Self-pay | Admitting: Gastroenterology

## 2021-08-28 VITALS — BP 135/79 | HR 96 | Temp 97.9°F | Ht 65.0 in | Wt 97.2 lb

## 2021-08-28 DIAGNOSIS — K2100 Gastro-esophageal reflux disease with esophagitis, without bleeding: Secondary | ICD-10-CM

## 2021-08-28 DIAGNOSIS — K2210 Ulcer of esophagus without bleeding: Secondary | ICD-10-CM

## 2021-08-28 NOTE — Patient Instructions (Addendum)
Schedule EGD  Continue Prevacid 30 mg twice a day Explained presumed etiology of reflux symptoms. Instruction provided in the use of antireflux medication - patient should take medication in the morning 30-45 minutes before eating breakfast. Discussed avoidance of eating within 2 hours of lying down to sleep and benefit of blocks to elevate head of bed. Also, will benefit from avoiding carbonated drinks/sodas or food that has tomatoes, spicy or greasy food.

## 2021-08-28 NOTE — Progress Notes (Signed)
Lee Wilson, M.D. Gastroenterology & Hepatology [facility] [address] [place], [place] [address] Primary Care Physician: Eulogio Bear, NP [address] [place] [address]  Referring MD: PCP  Chief Complaint:    History of Present Illness: Lee Wilson is a 75 y.o. 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  Patient is a poor historian.  He reports that he was doing well but sometime around March 2022 he presented worsening episodes of vomiting on and off for multiple months.  Due to this symptoms he presented significant weight loss in the past.  Patient does not provide this information, but based on medical chart he was presented significant dysphagia to solids with frequent regurgitation.  Due to the symptoms the patient underwent a CT of the abdomen and pelvis with IV contrast on 04/24/2021 which showed masslike thickening of the distal esophagus.  With these, the patient underwent an EGD by Dr. Alessandra Bevels at [facility] on 05/08/2021 which showed a masslike lesion in the distal esophagus at 30 cm from the incisors with a stigmata of recent bleeding which was biopsied but was negative for malignancy (I do not have the original report of this procedure, this information is obtained from notes in epic).  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  He subsequently underwent a PET CT on 05/26/2021 that showed the mid to lower esophageal mass that was hypermetabolic and a small subcarinal lymph node with borderline increased uptake.  He underwent a repeat EGD on 05/31/2021 which showed a large mass in the distal esophagus between 30 to 34 cm from the incisors which was nonobstructive and partially circumferential.  There was a irregular Z-line at 38  cm, there was scattered inflammation in the entire stomach.  Biopsies are not available.  Due to this the patient was referred to oncology and he was being followed by Dr. Betsy Coder.  Due to concern for malignancy, the patient was set up for potential regimen of chemotherapy and radiotherapy. IOt seems he received two cycles of RTX. However as there was no definite diagnosis for his esophageal mass and he was presenting worsening symptoms, he was admitted on 06/26/2021 to undergo an EGD with EUS by Dr. Rush Landmark.  EUS was performed on 06/29/2021 which showed submucosal mucosa between 20 to 25 cm, there was presence of a cratered esophageal ulcer involving more than 50% of the mucosal wall between 29 3033 cm with heaped up edges and crater centered.  Multiple biopsies were taken for histology.  There was a 6 cm hiatal hernia.  There was patchy erythema in the stomach.  The endoscopic ultrasound showed presence of an ulcer involving up to 8 mm in thickness with invasion through the muscularis propria.  5 passes of the needle were performed to obtain tissue via FNB.  There were 2 enlarged lymph nodes in the lower esophageal mediastinum with largest measuring 4 x 10 mm.  These nodes were irregular, hypoechoic and had well-defined margins but something was not performed.  Patient was discharged on Prevacid 30 mg twice a day. FNB did not show any malignant cells.  Pathology from biopsies showed ulcer and reactive changes with focal intestinal metaplasia but no presence of any dysplasia or malignancy.  PAS staining was negative for fungal organisms, staining was negative for CMV, HSV.  It was considered that his ulcer was possibly benign although a repeat EGD was recommended to the patient.  However he did not schedule a follow-up appointment for this.  He states that after being discharged from the hospital in September 2022, he feels better. States that he has not presented any more vomiting episodes for the last  month. The vomiting episodes have much more decreased in frequency.  He states that he is feeling well and is able to eat most of the food.  He has been compliant with his medication and is taking Prevacid twice a day.  He has lost only one pound ion the last 2 months and his weight is similar to how it was in August 2022. He is eating small portions, but he is more than he used to eat before being admitted to the hospital  Has occasional episodes of heartburn especially at night. Currently is taking Prevacid 30 mg twice a day - sprinkles the powder in the food.  The patient denies having any nausea, vomiting, fever, chills, hematochezia, melena, hematemesis, abdominal distention, abdominal pain, diarrhea, jaundice, pruritus or weight loss.  Last EGD: 06/29/2021 - EUS  as described above Last Colonoscopy:never had a colonoscopy, last stool test per patient was a year ago.  Social: former smoking, neg alcohol or illicit drug use Surgical: no abdominal surgeries  Past Medical History: Past Medical History:  Diagnosis Date   Asthma    Fall from height of greater than 3 feet 07/16/2016   GERD (gastroesophageal reflux disease)    Leg cramp 01/04/2021    Past Surgical History: Past Surgical History:  Procedure Laterality Date   BIOPSY  06/28/2021   Procedure: BIOPSY;  Surgeon: Irving Copas., MD;  Location: [facility];  Service: Gastroenterology;;   ESOPHAGOGASTRODUODENOSCOPY (EGD) WITH PROPOFOL N/A 06/28/2021   Procedure: ESOPHAGOGASTRODUODENOSCOPY (EGD) WITH PROPOFOL;  Surgeon: Irving Copas., MD;  Location: [facility];  Service: Gastroenterology;  Laterality: N/A;   FINE NEEDLE ASPIRATION  06/28/2021   Procedure: FINE NEEDLE ASPIRATION (FNA) LINEAR;  Surgeon: Irving Copas., MD;  Location: [facility];  Service: Gastroenterology;;   FRACTURE SURGERY     UPPER ESOPHAGEAL ENDOSCOPIC ULTRASOUND (EUS) N/A 06/28/2021   Procedure: UPPER ESOPHAGEAL ENDOSCOPIC  ULTRASOUND (EUS);  Surgeon: Irving Copas., MD;  Location: [facility];  Service: Gastroenterology;  Laterality: N/A;    Family History: Family History  Problem Relation Age of Onset   Leukemia Mother    Heart attack Father     Social History: Social History   Tobacco Use  Smoking Status Former  Smokeless Tobacco Never   Social History   Substance and Sexual Activity  Alcohol Use Yes   Comment: occasionally   Social History   Substance and Sexual Activity  Drug Use No    Allergies: Allergies  Allergen Reactions   Penicillin G Other (See Comments)    Unknown childhood reaction    Medications: Current Outpatient Medications  Medication Sig Dispense Refill   albuterol (VENTOLIN HFA) 108 (90 Base) MCG/ACT inhaler Inhale 2 puffs into the lungs every 4 (four) hours as needed for wheezing or shortness of breath. 18 g 2   cetirizine (ZYRTEC) 10 MG tablet Take 1 tablet (10 mg total) by mouth daily. 30 tablet 11   dexamethasone (DECADRON) 2 MG tablet Take 10 mg (5 tabs) at 10pm night prior to first chemo and 6am morning of first chemo 10 tablet 0   diclofenac Sodium (VOLTAREN) 1 % GEL Apply to back three  times as needed for pain (Patient taking differently: Apply 1 application topically 3 (three) times daily as needed (leg pain).) 100 g 2   Ensure (ENSURE) Take 237 mLs by mouth See admin instructions. Strawberry flavor - drink one to two cans by mouth daily     HYDROcodone-acetaminophen (HYCET) 7.5-325 mg/15 ml solution Take 10 mLs by mouth every 6 (six) hours as needed for moderate pain. 240 mL 0   hydrocortisone 2.5 % cream Apply topically 2 (two) times daily. Apply to face (Patient taking differently: Apply 1 application topically 2 (two) times daily as needed (facial dry skin/itching).) 30 g 2   lansoprazole (PREVACID) 30 MG capsule Take 1 capsule (30 mg total) by mouth 2 (two) times daily before a meal. 60 capsule 6   levothyroxine (SYNTHROID) 88 MCG tablet Take 1  tablet (88 mcg total) by mouth daily before breakfast. 90 tablet 0   lidocaine (LIDODERM) 5 % Place 1 patch onto the skin daily. Remove & Discard patch within 12 hours or as directed by MD 30 patch 0   Multiple Vitamin (MULTIVITAMIN WITH MINERALS) TABS tablet Take 1 tablet by mouth daily.     prochlorperazine (COMPAZINE) 5 MG tablet Take 1 tablet (5 mg total) by mouth every 6 (six) hours as needed for nausea or vomiting. 30 tablet 0   protein supplement (RESOURCE BENEPROTEIN) 6 g POWD Take 1 Scoop (6 g total) by mouth 3 (three) times daily with meals.     simvastatin (ZOCOR) 80 MG tablet Take 1 tablet (80 mg total) by mouth at bedtime. (Patient taking differently: Take 80 mg by mouth every morning.) 90 tablet 3   sucralfate (CARAFATE) 1 GM/10ML suspension Take 10 mLs (1 g total) by mouth 4 (four) times daily -  with meals and at bedtime. (Patient not taking: Reported on 08/28/2021) 420 mL 3   umeclidinium bromide (INCRUSE ELLIPTA) 62.5 MCG/INH AEPB Inhale 1 puff into the lungs daily. (Patient not taking: No sig reported) 30 each 3   No current facility-administered medications for this visit.    Review of Systems: GENERAL: negative for malaise, night sweats HEENT: No changes in hearing or vision, no nose bleeds or other nasal problems. NECK: Negative for lumps, goiter, pain and significant neck swelling RESPIRATORY: Negative for cough, wheezing CARDIOVASCULAR: Negative for chest pain, leg swelling, palpitations, orthopnea GI: SEE HPI MUSCULOSKELETAL: Negative for joint pain or swelling, back pain, and muscle pain. SKIN: Negative for lesions, rash PSYCH: Negative for sleep disturbance, mood disorder and recent psychosocial stressors. HEMATOLOGY Negative for prolonged bleeding, bruising easily, and swollen nodes. ENDOCRINE: Negative for cold or heat intolerance, polyuria, polydipsia and goiter. NEURO: negative for tremor, gait imbalance, syncope and seizures. The remainder of the review of  systems is noncontributory.   Physical Exam: BP 135/79 (BP Location: Right Arm, Patient Position: Sitting, Cuff Size: Small)   Pulse 96   Temp 97.9 F (36.6 C) (Oral)   Ht 5\' 5"  (1.651 m)   Wt 97 lb 3.2 oz (44.1 kg)   BMI 16.17 kg/m  GENERAL: The patient is AO x3, in no acute distress. Underweight. HEENT: Head is normocephalic and atraumatic. EOMI are intact. Mouth is well hydrated and without lesions. NECK: Supple. No masses LUNGS: Clear to auscultation. No presence of rhonchi/wheezing/rales. Adequate chest expansion HEART: RRR, normal s1 and s2. ABDOMEN: Soft, nontender, no guarding, no peritoneal signs, and nondistended. BS +. No masses. EXTREMITIES: Without any cyanosis, clubbing, rash, lesions or edema. NEUROLOGIC: AOx3, no focal motor deficit. SKIN:  no jaundice, no rashes   Imaging/Labs: as above  I personally reviewed and interpreted the available labs, imaging and endoscopic files.  Impression and Plan: Lee Wilson is a 75 y.o. 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  He presented significant symptoms of dysphagia and weight loss in the past while he was undergoing investigations to determine the etiology of his ulcer.  Even though there was presence of endoscopic findings and PET scan reports with abnormalities highly concerning for malignancy, so far the work-up has been negative.  Due to these, the plans for further radiation cycles and chemotherapy were postponed.  I agree with Dr. Donneta Romberg assessment that if these alterations are indeed related to a benign esophageal ulcer, they may have markedly improved with high-dose acid suppression.  He should continue taking the Prevacid 30 mg twice a day compliantly and 30 minutes before his meals, but we will also perform an EGD with with a transparent cap to  visualize the area further.  Fortunately, he has not presented any further weight loss and is now able to eat more food which is reassuring.  - Schedule EGD with distal transparent cap - Continue Prevacid 30 mg twice a day - Explained presumed etiology of reflux symptoms. Instruction provided in the use of antireflux medication - patient should take medication in the morning 30-45 minutes before eating breakfast. Discussed avoidance of eating within 2 hours of lying down to sleep and benefit of blocks to elevate head of bed. Also, will benefit from avoiding carbonated drinks/sodas or food that has tomatoes, spicy or greasy food.  All questions were answered.      Lee Peppers, MD Gastroenterology and Hepatology [facility]

## 2021-08-28 NOTE — H&P (View-Only) (Signed)
[name], M.D. Gastroenterology & Hepatology [facility] [address] [place], [place] [address] Primary Care Physician: Eulogio Bear, NP [address] [place] [address]  Referring MD: PCP  Chief Complaint:    History of Present Illness: Lee Wilson is a 75 y.o. 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  Patient is a poor historian.  He reports that he was doing well but sometime around March 2022 he presented worsening episodes of vomiting on and off for multiple months.  Due to this symptoms he presented significant weight loss in the past.  Patient does not provide this information, but based on medical chart he was presented significant dysphagia to solids with frequent regurgitation.  Due to the symptoms the patient underwent a CT of the abdomen and pelvis with IV contrast on 04/24/2021 which showed masslike thickening of the distal esophagus.  With these, the patient underwent an EGD by Dr. Alessandra Bevels at [facility] on 05/08/2021 which showed a masslike lesion in the distal esophagus at 30 cm from the incisors with a stigmata of recent bleeding which was biopsied but was negative for malignancy (I do not have the original report of this procedure, this information is obtained from notes in epic).  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  He subsequently underwent a PET CT on 05/26/2021 that showed the mid to lower esophageal mass that was hypermetabolic and a small subcarinal lymph node with borderline increased uptake.  He underwent a repeat EGD on 05/31/2021 which showed a large mass in the distal esophagus between 30 to 34 cm from the incisors which was nonobstructive and partially circumferential.  There was a irregular Z-line at 38  cm, there was scattered inflammation in the entire stomach.  Biopsies are not available.  Due to this the patient was referred to oncology and he was being followed by Dr. Betsy Coder.  Due to concern for malignancy, the patient was set up for potential regimen of chemotherapy and radiotherapy. IOt seems he received two cycles of RTX. However as there was no definite diagnosis for his esophageal mass and he was presenting worsening symptoms, he was admitted on 06/26/2021 to undergo an EGD with EUS by Dr. Rush Landmark.  EUS was performed on 06/29/2021 which showed submucosal mucosa between 20 to 25 cm, there was presence of a cratered esophageal ulcer involving more than 50% of the mucosal wall between 29 3033 cm with heaped up edges and crater centered.  Multiple biopsies were taken for histology.  There was a 6 cm hiatal hernia.  There was patchy erythema in the stomach.  The endoscopic ultrasound showed presence of an ulcer involving up to 8 mm in thickness with invasion through the muscularis propria.  5 passes of the needle were performed to obtain tissue via FNB.  There were 2 enlarged lymph nodes in the lower esophageal mediastinum with largest measuring 4 x 10 mm.  These nodes were irregular, hypoechoic and had well-defined margins but something was not performed.  Patient was discharged on Prevacid 30 mg twice a day. FNB did not show any malignant cells.  Pathology from biopsies showed ulcer and reactive changes with focal intestinal metaplasia but no presence of any dysplasia or malignancy.  PAS staining was negative for fungal organisms, staining was negative for CMV, HSV.  It was considered that his ulcer was possibly benign although a repeat EGD was recommended to the patient.  However he did not schedule a follow-up appointment for this.  He states that after being discharged from the hospital in September 2022, he feels better. States that he has not presented any more vomiting episodes for the last  month. The vomiting episodes have much more decreased in frequency.  He states that he is feeling well and is able to eat most of the food.  He has been compliant with his medication and is taking Prevacid twice a day.  He has lost only one pound ion the last 2 months and his weight is similar to how it was in Lee 2022. He is eating small portions, but he is more than he used to eat before being admitted to the hospital  Has occasional episodes of heartburn especially at night. Currently is taking Prevacid 30 mg twice a day - sprinkles the powder in the food.  The patient denies having any nausea, vomiting, fever, chills, hematochezia, melena, hematemesis, abdominal distention, abdominal pain, diarrhea, jaundice, pruritus or weight loss.  Last EGD: 06/29/2021 - EUS  as described above Last Colonoscopy:never had a colonoscopy, last stool test per patient was a year ago.  Social: former smoking, neg alcohol or illicit drug use Surgical: no abdominal surgeries  Past Medical History: Past Medical History:  Diagnosis Date   Asthma    Fall from height of greater than 3 feet 07/16/2016   GERD (gastroesophageal reflux disease)    Leg cramp 01/04/2021    Past Surgical History: Past Surgical History:  Procedure Laterality Date   BIOPSY  06/28/2021   Procedure: BIOPSY;  Surgeon: Irving Copas., MD;  Location: [facility];  Service: Gastroenterology;;   ESOPHAGOGASTRODUODENOSCOPY (EGD) WITH PROPOFOL N/A 06/28/2021   Procedure: ESOPHAGOGASTRODUODENOSCOPY (EGD) WITH PROPOFOL;  Surgeon: Irving Copas., MD;  Location: [facility];  Service: Gastroenterology;  Laterality: N/A;   FINE NEEDLE ASPIRATION  06/28/2021   Procedure: FINE NEEDLE ASPIRATION (FNA) LINEAR;  Surgeon: Irving Copas., MD;  Location: [facility];  Service: Gastroenterology;;   FRACTURE SURGERY     UPPER ESOPHAGEAL ENDOSCOPIC ULTRASOUND (EUS) N/A 06/28/2021   Procedure: UPPER ESOPHAGEAL ENDOSCOPIC  ULTRASOUND (EUS);  Surgeon: Irving Copas., MD;  Location: [facility];  Service: Gastroenterology;  Laterality: N/A;    Family History: Family History  Problem Relation Age of Onset   Leukemia Mother    Heart attack Father     Social History: Social History   Tobacco Use  Smoking Status Former  Smokeless Tobacco Never   Social History   Substance and Sexual Activity  Alcohol Use Yes   Comment: occasionally   Social History   Substance and Sexual Activity  Drug Use No    Allergies: Allergies  Allergen Reactions   Penicillin G Other (See Comments)    Unknown childhood reaction    Medications: Current Outpatient Medications  Medication Sig Dispense Refill   albuterol (VENTOLIN HFA) 108 (90 Base) MCG/ACT inhaler Inhale 2 puffs into the lungs every 4 (four) hours as needed for wheezing or shortness of breath. 18 g 2   cetirizine (ZYRTEC) 10 MG tablet Take 1 tablet (10 mg total) by mouth daily. 30 tablet 11   dexamethasone (DECADRON) 2 MG tablet Take 10 mg (5 tabs) at 10pm night prior to first chemo and 6am morning of first chemo 10 tablet 0   diclofenac Sodium (VOLTAREN) 1 % GEL Apply to back three  times as needed for pain (Patient taking differently: Apply 1 application topically 3 (three) times daily as needed (leg pain).) 100 g 2   Ensure (ENSURE) Take 237 mLs by mouth See admin instructions. Strawberry flavor - drink one to two cans by mouth daily     HYDROcodone-acetaminophen (HYCET) 7.5-325 mg/15 ml solution Take 10 mLs by mouth every 6 (six) hours as needed for moderate pain. 240 mL 0   hydrocortisone 2.5 % cream Apply topically 2 (two) times daily. Apply to face (Patient taking differently: Apply 1 application topically 2 (two) times daily as needed (facial dry skin/itching).) 30 g 2   lansoprazole (PREVACID) 30 MG capsule Take 1 capsule (30 mg total) by mouth 2 (two) times daily before a meal. 60 capsule 6   levothyroxine (SYNTHROID) 88 MCG tablet Take 1  tablet (88 mcg total) by mouth daily before breakfast. 90 tablet 0   lidocaine (LIDODERM) 5 % Place 1 patch onto the skin daily. Remove & Discard patch within 12 hours or as directed by MD 30 patch 0   Multiple Vitamin (MULTIVITAMIN WITH MINERALS) TABS tablet Take 1 tablet by mouth daily.     prochlorperazine (COMPAZINE) 5 MG tablet Take 1 tablet (5 mg total) by mouth every 6 (six) hours as needed for nausea or vomiting. 30 tablet 0   protein supplement (RESOURCE BENEPROTEIN) 6 g POWD Take 1 Scoop (6 g total) by mouth 3 (three) times daily with meals.     simvastatin (ZOCOR) 80 MG tablet Take 1 tablet (80 mg total) by mouth at bedtime. (Patient taking differently: Take 80 mg by mouth every morning.) 90 tablet 3   sucralfate (CARAFATE) 1 GM/10ML suspension Take 10 mLs (1 g total) by mouth 4 (four) times daily -  with meals and at bedtime. (Patient not taking: Reported on 08/28/2021) 420 mL 3   umeclidinium bromide (INCRUSE ELLIPTA) 62.5 MCG/INH AEPB Inhale 1 puff into the lungs daily. (Patient not taking: No sig reported) 30 each 3   No current facility-administered medications for this visit.    Review of Systems: GENERAL: negative for malaise, night sweats HEENT: No changes in hearing or vision, no nose bleeds or other nasal problems. NECK: Negative for lumps, goiter, pain and significant neck swelling RESPIRATORY: Negative for cough, wheezing CARDIOVASCULAR: Negative for chest pain, leg swelling, palpitations, orthopnea GI: SEE HPI MUSCULOSKELETAL: Negative for joint pain or swelling, back pain, and muscle pain. SKIN: Negative for lesions, rash PSYCH: Negative for sleep disturbance, mood disorder and recent psychosocial stressors. HEMATOLOGY Negative for prolonged bleeding, bruising easily, and swollen nodes. ENDOCRINE: Negative for cold or heat intolerance, polyuria, polydipsia and goiter. NEURO: negative for tremor, gait imbalance, syncope and seizures. The remainder of the review of  systems is noncontributory.   Physical Exam: BP 135/79 (BP Location: Right Arm, Patient Position: Sitting, Cuff Size: Small)   Pulse 96   Temp 97.9 F (36.6 C) (Oral)   Ht 5\' 5"  (1.651 m)   Wt 97 lb 3.2 oz (44.1 kg)   BMI 16.17 kg/m  GENERAL: The patient is AO x3, in no acute distress. Underweight. HEENT: Head is normocephalic and atraumatic. EOMI are intact. Mouth is well hydrated and without lesions. NECK: Supple. No masses LUNGS: Clear to auscultation. No presence of rhonchi/wheezing/rales. Adequate chest expansion HEART: RRR, normal s1 and s2. ABDOMEN: Soft, nontender, no guarding, no peritoneal signs, and nondistended. BS +. No masses. EXTREMITIES: Without any cyanosis, clubbing, rash, lesions or edema. NEUROLOGIC: AOx3, no focal motor deficit. SKIN:  no jaundice, no rashes   Imaging/Labs: as above  I personally reviewed and interpreted the available labs, imaging and endoscopic files.  Impression and Plan: Lee Wilson is a 75 y.o. 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  He presented significant symptoms of dysphagia and weight loss in the past while he was undergoing investigations to determine the etiology of his ulcer.  Even though there was presence of endoscopic findings and PET scan reports with abnormalities highly concerning for malignancy, so far the work-up has been negative.  Due to these, the plans for further radiation cycles and chemotherapy were postponed.  I agree with Dr. Donneta Romberg assessment that if these alterations are indeed related to a benign esophageal ulcer, they may have markedly improved with high-dose acid suppression.  He should continue taking the Prevacid 30 mg twice a day compliantly and 30 minutes before his meals, but we will also perform an EGD with with a transparent cap to  visualize the area further.  Fortunately, he has not presented any further weight loss and is now able to eat more food which is reassuring.  - Schedule EGD with distal transparent cap - Continue Prevacid 30 mg twice a day - Explained presumed etiology of reflux symptoms. Instruction provided in the use of antireflux medication - patient should take medication in the morning 30-45 minutes before eating breakfast. Discussed avoidance of eating within 2 hours of lying down to sleep and benefit of blocks to elevate head of bed. Also, will benefit from avoiding carbonated drinks/sodas or food that has tomatoes, spicy or greasy food.  All questions were answered.      [name], MD Gastroenterology and Hepatology [facility]

## 2021-08-31 ENCOUNTER — Other Ambulatory Visit: Payer: Self-pay

## 2021-08-31 ENCOUNTER — Encounter (HOSPITAL_COMMUNITY)
Admission: RE | Admit: 2021-08-31 | Discharge: 2021-08-31 | Disposition: A | Discharge status: Home / Self Care | Payer: Medicare Other | Source: Ambulatory Visit | Attending: Gastroenterology | Admitting: Gastroenterology

## 2021-08-31 ENCOUNTER — Encounter (HOSPITAL_COMMUNITY): Payer: Self-pay

## 2021-08-31 VITALS — BP 142/73 | HR 81 | Temp 97.6°F | Resp 18 | Ht 65.0 in | Wt 97.2 lb

## 2021-08-31 DIAGNOSIS — Z01812 Encounter for preprocedural laboratory examination: Secondary | ICD-10-CM | POA: Insufficient documentation

## 2021-08-31 DIAGNOSIS — D649 Anemia, unspecified: Secondary | ICD-10-CM

## 2021-08-31 HISTORY — DX: Anemia, unspecified: D64.9

## 2021-08-31 HISTORY — DX: Malignant (primary) neoplasm, unspecified: C80.1

## 2021-08-31 LAB — CBC WITH DIFFERENTIAL/PLATELET
Abs Immature Granulocytes: 0.01 10*3/uL (ref 0.00–0.07)
Basophils Absolute: 0 10*3/uL (ref 0.0–0.1)
Basophils Relative: 0 %
Eosinophils Absolute: 0.1 10*3/uL (ref 0.0–0.5)
Eosinophils Relative: 2 %
HCT: 35.5 % — ABNORMAL LOW (ref 39.0–52.0)
Hemoglobin: 12.7 g/dL — ABNORMAL LOW (ref 13.0–17.0)
Immature Granulocytes: 0 %
Lymphocytes Relative: 27 %
Lymphs Abs: 1.8 10*3/uL (ref 0.7–4.0)
MCH: 35.9 pg — ABNORMAL HIGH (ref 26.0–34.0)
MCHC: 35.8 g/dL (ref 30.0–36.0)
MCV: 100.3 fL — ABNORMAL HIGH (ref 80.0–100.0)
Monocytes Absolute: 0.5 10*3/uL (ref 0.1–1.0)
Monocytes Relative: 8 %
Neutro Abs: 4.3 10*3/uL (ref 1.7–7.7)
Neutrophils Relative %: 63 %
Platelets: 161 10*3/uL (ref 150–400)
RBC: 3.54 MIL/uL — ABNORMAL LOW (ref 4.22–5.81)
RDW: 13.2 % (ref 11.5–15.5)
WBC: 6.9 10*3/uL (ref 4.0–10.5)
nRBC: 0 % (ref 0.0–0.2)

## 2021-08-31 NOTE — Patient Instructions (Addendum)
ARIQ KHAMIS  08/31/2021     @PREFPERIOPPHARMACY @   Your procedure is scheduled on 09/05/2021.  Report to [facility] at 12:45 PM  Call this number if you have problems the morning of surgery:  [phone]   Remember:  Do not eat or drink after midnight.   Please follow the diet instructions given to you by Dr Colman Cater office    Take these medicines the morning of surgery with A SIP OF WATER : Hydrocodone, Prevacid and Synthroid   Please use your inhaler the morning of the procedure    Do not wear jewelry, make-up or nail polish.  Do not wear lotions, powders, or perfumes, or deodorant.  Do not shave 48 hours prior to surgery.  Men may shave face and neck.  Do not bring valuables to the hospital.  [facility] is not responsible for any belongings or valuables.  Contacts, dentures or bridgework may not be worn into surgery.  Leave your suitcase in the car.  After surgery it may be brought to your room.  For patients admitted to the hospital, discharge time will be determined by your treatment team.  Patients discharged the day of surgery will not be allowed to drive home.   Name and phone number of your driver:   Family Special instructions:  N/a  Please read over the following fact sheets that you were given. Care and Recovery After Surgery    Upper Endoscopy, Adult Upper endoscopy is a procedure to look inside the upper GI (gastrointestinal) tract. The upper GI tract is made up of: The part of the body that moves food from your mouth to your stomach (esophagus). The stomach. The first part of your small intestine (duodenum). This procedure is also called esophagogastroduodenoscopy (EGD) or gastroscopy. In this procedure, your health care provider passes a thin, flexible tube (endoscope) through your mouth and down your esophagus into your stomach. A small camera is attached to the end of the tube. Images from the camera appear on a monitor in the exam room.  During this procedure, your health care provider may also remove a small piece of tissue to be sent to a lab and examined under a microscope (biopsy). Your health care provider may do an upper endoscopy to diagnose cancers of the upper GI tract. You may also have this procedure to find the cause of other conditions, such as: Stomach pain. Heartburn. Pain or problems when swallowing. Nausea and vomiting. Stomach bleeding. Stomach ulcers. Tell a health care provider about: Any allergies you have. All medicines you are taking, including vitamins, herbs, eye drops, creams, and over-the-counter medicines. Any problems you or family members have had with anesthetic medicines. Any blood disorders you have. Any surgeries you have had. Any medical conditions you have. Whether you are pregnant or may be pregnant. What are the risks? Generally, this is a safe procedure. However, problems may occur, including: Infection. Bleeding. Allergic reactions to medicines. A tear or hole (perforation) in the esophagus, stomach, or duodenum. What happens before the procedure? Staying hydrated Follow instructions from your health care provider about hydration, which may include: Up to 2 hours before the procedure - you may continue to drink clear liquids, such as water, clear fruit juice, black coffee, and plain tea.  Eating and drinking restrictions Follow instructions from your health care provider about eating and drinking, which may include: 8 hours before the procedure - stop eating heavy meals or foods, such as meat, fried foods, or fatty  foods. 6 hours before the procedure - stop eating light meals or foods, such as toast or cereal. 6 hours before the procedure - stop drinking milk or drinks that contain milk. 2 hours before the procedure - stop drinking clear liquids. Medicines Ask your health care provider about: Changing or stopping your regular medicines. This is especially important if you are  taking diabetes medicines or blood thinners. Taking medicines such as aspirin and ibuprofen. These medicines can thin your blood. Do not take these medicines unless your health care provider tells you to take them. Taking over-the-counter medicines, vitamins, herbs, and supplements. General instructions Plan to have someone take you home from the hospital or clinic. If you will be going home right after the procedure, plan to have someone with you for 24 hours. Ask your health care provider what steps will be taken to help prevent infection. What happens during the procedure?  An IV will be inserted into one of your veins. You may be given one or more of the following: A medicine to help you relax (sedative). A medicine to numb the throat (local anesthetic). You will lie on your left side on an exam table. Your health care provider will pass the endoscope through your mouth and down your esophagus. Your health care provider will use the scope to check the inside of your esophagus, stomach, and duodenum. Biopsies may be taken. The endoscope will be removed. The procedure may vary among health care providers and hospitals. What happens after the procedure? Your blood pressure, heart rate, breathing rate, and blood oxygen level will be monitored until you leave the hospital or clinic. Do not drive for 24 hours if you were given a sedative during your procedure. When your throat is no longer numb, you may be given some fluids to drink. It is up to you to get the results of your procedure. Ask your health care provider, or the department that is doing the procedure, when your results will be ready. Summary Upper endoscopy is a procedure to look inside the upper GI tract. During the procedure, an IV will be inserted into one of your veins. You may be given a medicine to help you relax. A medicine will be used to numb your throat. The endoscope will be passed through your mouth and down your  esophagus. This information is not intended to replace advice given to you by your health care provider. Make sure you discuss any questions you have with your health care provider. Document Revised: 03/26/2018 Document Reviewed: 03/03/2018 Elsevier Patient Education  [employer].

## 2021-09-05 ENCOUNTER — Ambulatory Visit (HOSPITAL_COMMUNITY): Payer: Medicare Other | Admitting: Anesthesiology

## 2021-09-05 ENCOUNTER — Other Ambulatory Visit (INDEPENDENT_AMBULATORY_CARE_PROVIDER_SITE_OTHER): Payer: Self-pay | Admitting: Gastroenterology

## 2021-09-05 ENCOUNTER — Encounter (HOSPITAL_COMMUNITY): Payer: Self-pay | Admitting: Gastroenterology

## 2021-09-05 ENCOUNTER — Encounter (HOSPITAL_COMMUNITY)
Admission: RE | Disposition: A | Discharge status: Home / Self Care | Payer: Self-pay | Source: Ambulatory Visit | Attending: Gastroenterology

## 2021-09-05 ENCOUNTER — Other Ambulatory Visit: Payer: Self-pay

## 2021-09-05 ENCOUNTER — Ambulatory Visit (HOSPITAL_COMMUNITY)
Admission: RE | Admit: 2021-09-05 | Discharge: 2021-09-05 | Disposition: A | Discharge status: Home / Self Care | Payer: Medicare Other | Source: Ambulatory Visit | Attending: Gastroenterology | Admitting: Gastroenterology

## 2021-09-05 DIAGNOSIS — K449 Diaphragmatic hernia without obstruction or gangrene: Secondary | ICD-10-CM | POA: Diagnosis not present

## 2021-09-05 DIAGNOSIS — K2270 Barrett's esophagus without dysplasia: Secondary | ICD-10-CM | POA: Diagnosis not present

## 2021-09-05 DIAGNOSIS — K2210 Ulcer of esophagus without bleeding: Secondary | ICD-10-CM | POA: Diagnosis not present

## 2021-09-05 DIAGNOSIS — Z87891 Personal history of nicotine dependence: Secondary | ICD-10-CM | POA: Insufficient documentation

## 2021-09-05 HISTORY — PX: BIOPSY: SHX5522

## 2021-09-05 HISTORY — PX: ESOPHAGOGASTRODUODENOSCOPY (EGD) WITH PROPOFOL: SHX5813

## 2021-09-05 SURGERY — ESOPHAGOGASTRODUODENOSCOPY (EGD) WITH PROPOFOL
Anesthesia: General

## 2021-09-05 MED ORDER — SUCRALFATE 1 G PO TABS: 1.0000 g | ORAL_TABLET | Freq: Three times a day (TID) | ORAL | 0 refills | Status: DC

## 2021-09-05 MED ORDER — PROPOFOL 10 MG/ML IV BOLUS
INTRAVENOUS | Status: DC | PRN
  Administered 2021-09-05: 20 mg via INTRAVENOUS
  Administered 2021-09-05: 50 mg via INTRAVENOUS
  Administered 2021-09-05 (×9): 20 mg via INTRAVENOUS

## 2021-09-05 MED ORDER — SUCRALFATE 1 GM/10ML PO SUSP: 1.0000 g | Freq: Three times a day (TID) | ORAL | 3 refills | Status: DC

## 2021-09-05 MED ORDER — LACTATED RINGERS IV SOLN: INTRAVENOUS | Status: DC

## 2021-09-05 MED ORDER — FAMOTIDINE 40 MG PO TABS: 40.0000 mg | ORAL_TABLET | Freq: Two times a day (BID) | ORAL | 0 refills | Status: DC

## 2021-09-05 NOTE — Discharge Instructions (Signed)
You are being discharged to home.  Resume your previous diet.  We are waiting for your pathology results.  Continue your present medications - Prevacid 30 mg twice a day. Start carafate slurry 1 g every 6 hours. Start famotidine 40 mg twice a day for 3 months.  Your physician has recommended a repeat upper endoscopy for surveillance based on pathology results.

## 2021-09-05 NOTE — Interval H&P Note (Signed)
History and Physical Interval Note:  09/05/2021 1:03 PM Lee Wilson is a 75 y.o. 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 BP (!) 143/74   Pulse 75   Temp 97.7 F (36.5 C) (Oral)   Resp 18   SpO2 100%  GENERAL: The patient is AO x3, in no acute distress. HEENT: Head is normocephalic and atraumatic. EOMI are intact. Mouth is well hydrated and without lesions. Underweight. NECK: Supple. No masses LUNGS: Clear to auscultation. No presence of rhonchi/wheezing/rales. Adequate chest expansion HEART: RRR, normal s1 and s2. ABDOMEN: Soft, nontender, no guarding, no peritoneal signs, and nondistended. BS +. No masses. EXTREMITIES: Without any cyanosis, clubbing, rash, lesions or edema. NEUROLOGIC: AOx3, no focal motor deficit. SKIN: no jaundice, no rashes   [name]  has presented today for surgery, with the diagnosis of Large Esophageal Ulcer.  The various methods of treatment have been discussed with the patient and family. After consideration of risks, benefits and other options for treatment, the patient has consented to  Procedure(s) with comments: ESOPHAGOGASTRODUODENOSCOPY (EGD) WITH PROPOFOL (N/A) - 1:30 as a surgical intervention.  The patient's history has been reviewed, patient examined, no change in status, stable for surgery.  I have reviewed the patient's chart and labs.  Questions were answered to the patient's satisfaction.     [name]

## 2021-09-05 NOTE — Op Note (Signed)
[facility] Patient Name: Lee Wilson Procedure Date: 09/05/2021 12:22 PM MRN: [id number] Date of Birth: [birth year] Attending MD: [name] ,  CSN: [id number] Age: 75 Admit Type: Outpatient Procedure:                Upper GI endoscopy Indications:              Follow-up of esophageal ulcer, previous concern for                            esophageal cancer. Providers:                [name], Lambert Mody, Raphael Gibney,                            Technician Referring MD:              Medicines:                Monitored Anesthesia Care Complications:            No immediate complications. Estimated Blood Loss:     Estimated blood loss: none. Procedure:                Pre-Anesthesia Assessment:                           - Prior to the procedure, a History and Physical                            was performed, and patient medications, allergies                            and sensitivities were reviewed. The patient's                            tolerance of previous anesthesia was reviewed.                           - The risks and benefits of the procedure and the                            sedation options and risks were discussed with the                            patient. All questions were answered and informed                            consent was obtained.                           - ASA Grade Assessment: II - A patient with mild                            systemic disease.                           After obtaining informed consent, the endoscope was  passed under direct vision. Throughout the                            procedure, the patient's blood pressure, pulse, and                            oxygen saturations were monitored continuously. The                            [id number]-H190 ([id number]) scope was introduced through the                            mouth, and advanced to the second part of duodenum.                             The upper GI endoscopy was accomplished without                            difficulty. The patient tolerated the procedure                            well. Scope In: 1:16:17 PM Scope Out: 1:50:39 PM Total Procedure Duration: 0 hours 34 minutes 22 seconds  Findings:      The esophagus and gastroesophageal junction were examined with white       light and narrow band imaging (NBI). There were esophageal mucosal       changes consistent with long-segment Barrett's esophagus, classified as       Barrett's stage C0-M19 per Prague criteria, classified as Barrett's       stage C20-M20 per Prague criteria. These changes involved the mucosa       extending to an irregular Z-Wilson (19 cm from the incisors).       Salmon-colored mucosa was present and no other visible abnormalities       were present. The maximum longitudinal extent of these esophageal       mucosal changes was 13 cm in length. Mucosa was biopsied with a cold       forceps for histology in 4 quadrants at intervals of 1 cm. A total of 13       specimen bottles were sent to pathology.      One cratered esophageal ulcer with no bleeding and no stigmata of recent       bleeding was found 30 to 32 cm from the incisors. The lesion was 1 mm in       largest dimension. Biopsies were taken with a cold forceps for histology.      A 3 cm hiatal hernia was present.      The entire examined stomach was normal.      The examined duodenum was normal. Impression:               - Esophageal mucosal changes consistent with                            long-segment Barrett's esophagus, classified as  Barrett's stage C0-M19 per Prague criteria,                            classified as Barrett's stage C20-M20 per Prague                            criteria. Biopsied.                           - Esophageal ulcer with no bleeding and no stigmata                            of recent bleeding. Biopsied.                            - 3 cm hiatal hernia.                           - Normal stomach.                           - Normal examined duodenum. Moderate Sedation:      Per Anesthesia Care Recommendation:           - Discharge patient to home (ambulatory).                           - Resume previous diet.                           - Await pathology results.                           - Continue present medications - Prevacid 30 mg                            twice a day.                           - Start carafate slurry 1 g every 6 hours.                           - Start famotidine 40 mg twice a day for 3 months.                           - Repeat upper endoscopy for surveillance based on                            pathology results. Procedure Code(s):        --- Professional ---                           [phone], Esophagogastroduodenoscopy, flexible,                            transoral; with biopsy, single or multiple Diagnosis Code(s):        --- Professional ---  K22.70, Barrett's esophagus without dysplasia                           K22.10, Ulcer of esophagus without bleeding                           K44.9, Diaphragmatic hernia without obstruction or                            gangrene CPT copyright 2019 American Medical Association. All rights reserved. The codes documented in this report are preliminary and upon coder review may  be revised to meet current compliance requirements. [name], MD [name],  09/05/2021 2:04:47 PM This report has been signed electronically. Number of Addenda: 0

## 2021-09-05 NOTE — Progress Notes (Signed)
Discussed this procedure with the patient via phone call also sent the patient a copy of this instruction sheet.

## 2021-09-05 NOTE — Progress Notes (Signed)
-  Put a Carafate into a cleaned out old pill bottle or other small closed container.   -Pour in 20 ml (4 tsp) of warm water (could be flavored water or you could use tepid tea). -Let it sit for about 5 minutes shaking occasionally -Drink it down -Clean the bottle for next use  Please send these instructions to the patient.

## 2021-09-05 NOTE — Anesthesia Postprocedure Evaluation (Signed)
Anesthesia Post Note  Patient: Lee Wilson  Procedure(s) Performed: ESOPHAGOGASTRODUODENOSCOPY (EGD) WITH PROPOFOL BIOPSY  Patient location during evaluation: Phase II Anesthesia Type: General Anesthetic complications: no Comments: Phase nursing staff discharged the patient as per protocol.   No notable events documented.   Last Vitals:  Vitals:   09/05/21 1359 09/05/21 1401  BP: (!) 96/55   Pulse: (!) 54   Resp: 16   Temp: 36.7 C 36.7 C  SpO2: 98%     Last Pain:  Vitals:   09/05/21 1401  TempSrc: Axillary  PainSc:                  Krishna Dancel C Eloisa Chokshi

## 2021-09-05 NOTE — Transfer of Care (Signed)
Immediate Anesthesia Transfer of Care Note  Patient: Lee Wilson  Procedure(s) Performed: ESOPHAGOGASTRODUODENOSCOPY (EGD) WITH PROPOFOL  Patient Location: PACU  Anesthesia Type:General  Level of Consciousness: awake, alert  and oriented  Airway & Oxygen Therapy: Patient Spontanous Breathing and Patient connected to nasal cannula oxygen  Post-op Assessment: Report given to RN, Post -op Vital signs reviewed and stable and Patient moving all extremities  Post vital signs: Reviewed and stable  Last Vitals:  Vitals Value Taken Time  BP    Temp    Pulse    Resp    SpO2      Last Pain:  Vitals:   09/05/21 1308  TempSrc:   PainSc: 0-No pain      Patients Stated Pain Goal: 6 (97/91/50 4136)  Complications: No notable events documented.

## 2021-09-05 NOTE — Anesthesia Procedure Notes (Signed)
Procedure Name: MAC Date/Time: 09/05/2021 1:11 PM Performed by: Annamary Carolin, CRNA Pre-anesthesia Checklist: Patient identified, Emergency Drugs available, Suction available, Patient being monitored and Timeout performed Patient Re-evaluated:Patient Re-evaluated prior to induction Oxygen Delivery Method: Nasal cannula Preoxygenation: Pre-oxygenation with 100% oxygen Induction Type: IV induction Dental Injury: Teeth and Oropharynx as per pre-operative assessment

## 2021-09-05 NOTE — Anesthesia Preprocedure Evaluation (Signed)
Anesthesia Evaluation  Patient identified by MRN, date of birth, ID band Patient awake    Reviewed: Allergy & Precautions, H&P , NPO status , Patient's Chart, lab work & pertinent test results  Airway Mallampati: I  TM Distance: >3 FB Neck ROM: Full    Dental  (+) Dental Advisory Given, Missing, Poor Dentition, Chipped   Pulmonary asthma , former smoker,    Pulmonary exam normal breath sounds clear to auscultation       Cardiovascular Exercise Tolerance: Good + Peripheral Vascular Disease  Normal cardiovascular exam Rhythm:Regular Rate:Normal     Neuro/Psych negative neurological ROS  negative psych ROS   GI/Hepatic PUD, GERD  Medicated,(+)     substance abuse  alcohol use, Esophageal mass Mass-like thickening of the distal esophagus, concerning formalignancy. Further evaluation with endoscopy is recommended. 2. Moderate compression deformity of the T12 vertebral body is newfrom prior, although age indeterminate. Correlate with pointtenderness. 3. Minimal tree-in-bud opacity within the dependent portion of theright lung base and dependent portion of the right middle lobe mayrepresent aspiration and/or an infectious or inflammatory bronchiolitis.    Endo/Other  Hypothyroidism   Renal/GU negative Renal ROS  negative genitourinary   Musculoskeletal  (+) Arthritis , Osteoarthritis,  C6, C7 cervical fx   Abdominal   Peds negative pediatric ROS (+)  Hematology negative hematology ROS (+) anemia ,   Anesthesia Other Findings Protein calorie malnutrition Chronic bronchitis (HCC) GERD (gastroesophageal reflux disease) C6 cervical fracture (HCC) C7 cervical fracture (HCC) Spleen hematoma T1 vertebral fracture (HCC) Carotid artery disease ([place]) Cerebrovascular disease or lesion Lytic bone lesions on xray Protein-calorie malnutrition (HCC) Hypothyroidism Alcohol use DDD (degenerative disc disease),  lumbar Macrocytic anemia Rash Esophageal mass Chest pain Dehydration Protein-calorie malnutrition, severe Esophageal ulcer    Reproductive/Obstetrics negative OB ROS                            Anesthesia Physical Anesthesia Plan  ASA: 3  Anesthesia Plan: General   Post-op Pain Management: Minimal or no pain anticipated   Induction: Intravenous  PONV Risk Score and Plan:   Airway Management Planned: Nasal Cannula and Natural Airway  Additional Equipment:   Intra-op Plan:   Post-operative Plan:   Informed Consent: I have reviewed the patients History and Physical, chart, labs and discussed the procedure including the risks, benefits and alternatives for the proposed anesthesia with the patient or authorized representative who has indicated his/her understanding and acceptance.     Dental advisory given  Plan Discussed with: CRNA and Surgeon  Anesthesia Plan Comments:         Anesthesia Quick Evaluation

## 2021-09-08 LAB — SURGICAL PATHOLOGY

## 2021-09-11 ENCOUNTER — Encounter (INDEPENDENT_AMBULATORY_CARE_PROVIDER_SITE_OTHER): Payer: Self-pay | Admitting: *Deleted

## 2021-09-11 ENCOUNTER — Encounter (HOSPITAL_COMMUNITY): Payer: Self-pay | Admitting: Gastroenterology

## 2021-09-11 NOTE — Progress Notes (Signed)
Requested by anesthesia as part of pre-op evaluation.

## 2021-09-13 ENCOUNTER — Encounter (INDEPENDENT_AMBULATORY_CARE_PROVIDER_SITE_OTHER): Payer: Self-pay

## 2021-09-13 ENCOUNTER — Other Ambulatory Visit (INDEPENDENT_AMBULATORY_CARE_PROVIDER_SITE_OTHER): Payer: Self-pay

## 2021-09-13 DIAGNOSIS — C159 Malignant neoplasm of esophagus, unspecified: Secondary | ICD-10-CM

## 2021-10-02 ENCOUNTER — Ambulatory Visit: Payer: Medicare Other | Admitting: Nurse Practitioner

## 2021-10-04 ENCOUNTER — Ambulatory Visit (INDEPENDENT_AMBULATORY_CARE_PROVIDER_SITE_OTHER): Payer: Medicare Other | Admitting: Nurse Practitioner

## 2021-10-04 ENCOUNTER — Encounter: Payer: Self-pay | Admitting: Oncology

## 2021-10-04 ENCOUNTER — Other Ambulatory Visit: Payer: Self-pay

## 2021-10-04 VITALS — BP 112/68 | HR 96 | Ht 65.0 in | Wt 101.0 lb

## 2021-10-04 DIAGNOSIS — K2100 Gastro-esophageal reflux disease with esophagitis, without bleeding: Secondary | ICD-10-CM

## 2021-10-04 DIAGNOSIS — J410 Simple chronic bronchitis: Secondary | ICD-10-CM

## 2021-10-04 DIAGNOSIS — E039 Hypothyroidism, unspecified: Secondary | ICD-10-CM

## 2021-10-04 DIAGNOSIS — K2210 Ulcer of esophagus without bleeding: Secondary | ICD-10-CM

## 2021-10-04 DIAGNOSIS — Z1211 Encounter for screening for malignant neoplasm of colon: Secondary | ICD-10-CM

## 2021-10-04 MED ORDER — ALBUTEROL SULFATE HFA 108 (90 BASE) MCG/ACT IN AERS: 2.0000 | INHALATION_SPRAY | RESPIRATORY_TRACT | 2 refills | Status: DC | PRN

## 2021-10-04 NOTE — Patient Instructions (Signed)
CT scan is 10/19/21 at 4 pm .

## 2021-10-05 ENCOUNTER — Encounter: Payer: Self-pay | Admitting: Nurse Practitioner

## 2021-10-05 LAB — CBC WITH DIFFERENTIAL/PLATELET
Absolute Monocytes: 676 cells/uL (ref 200–950)
Basophils Absolute: 61 cells/uL (ref 0–200)
Basophils Relative: 0.8 %
Eosinophils Absolute: 182 cells/uL (ref 15–500)
Eosinophils Relative: 2.4 %
HCT: 37.2 % — ABNORMAL LOW (ref 38.5–50.0)
Hemoglobin: 13.2 g/dL (ref 13.2–17.1)
Lymphs Abs: 1961 cells/uL (ref 850–3900)
MCH: 35.1 pg — ABNORMAL HIGH (ref 27.0–33.0)
MCHC: 35.5 g/dL (ref 32.0–36.0)
MCV: 98.9 fL (ref 80.0–100.0)
MPV: 9.8 fL (ref 7.5–12.5)
Monocytes Relative: 8.9 %
Neutro Abs: 4720 cells/uL (ref 1500–7800)
Neutrophils Relative %: 62.1 %
Platelets: 176 10*3/uL (ref 140–400)
RBC: 3.76 10*6/uL — ABNORMAL LOW (ref 4.20–5.80)
RDW: 12.4 % (ref 11.0–15.0)
Total Lymphocyte: 25.8 %
WBC: 7.6 10*3/uL (ref 3.8–10.8)

## 2021-10-05 LAB — TSH: TSH: 10.99 mIU/L — ABNORMAL HIGH (ref 0.40–4.50)

## 2021-10-05 NOTE — Assessment & Plan Note (Signed)
Chronic.  Continue collaboration with GI.

## 2021-10-05 NOTE — Assessment & Plan Note (Addendum)
Chronic.  We will check TSH today.  Patient reports he has been taking levothyroxine more consistently, however it is difficult to determine how much this has been.  He is somewhat of a poor historian in regard to his medication.  I worry about increasing levothyroxine in this frail patient and sending him into thyroid storm.  Follow-up 4 months.

## 2021-10-05 NOTE — Assessment & Plan Note (Signed)
Chronic.  Continue collaboration with GI.  Check blood counts today.

## 2021-10-05 NOTE — Assessment & Plan Note (Signed)
Chronic.  Patient is unable to afford inhalers-we will consult with pharmacy team.  He is not using Spiriva anymore.  He has not needed rescue inhaler recently, however I did remind him that if he starts using more frequently, I do want him to let me know.  Continue with current plan- follow-up 4 months or sooner if breathing worsens.

## 2021-10-05 NOTE — Progress Notes (Signed)
Subjective:    Patient ID: Lee Wilson, male    DOB: 12-Oct-1946, 75 y.o.   MRN: [id number]  HPI: Lee Wilson is a 75 y.o. male presenting for  Chief Complaint  Patient presents with   Asthma   COPD Patient reports his breathing is well controlled.  He tells me the daily inhaler was too expensive, so he did not pick up from the pharmacy.  He has not been using the albuterol because his breathing has been good. COPD status: stable Satisfied with current treatment?: yes Oxygen use: no Dyspnea frequency: Rarely Cough frequency: Rarely Rescue inhaler frequency: Rarely Limitation of activity: no Productive cough:  Last Spirometry: N/A Pneumovax: Up to Date Influenza: Up to Date  ESOPHAGEAL ULCER Patient reports he is no longer following with oncology.  He saw gastroenterology last month and they did another biopsy.  Again, there were no cancer cells found.  Since they have aggressively treated the ulcer with Carafate and a PPI, he has been able to eat more.  He denies regurgitating food.  He says he does sometimes still have abdominal pain in his epigastric area, but the acid medicine helps.  He has gained 4 pounds since last visit.  Unfortunately, he tells me today he is drinking alcohol again.  He is tells me he drinks 2 beers at the most. Duration: months Onset: gradual Severity: Mild to moderate Quality: Aching Location:  epigastric  Episode duration: Minutes Fever: no Nausea: no Vomiting: no Weight loss: no Decreased appetite: no Diarrhea: no Constipation: yes Blood in stool: no Heartburn: no Jaundice: no Rash: no Dysuria/urinary frequency: no Hematuria: no  HYPOTHYROIDISM Reports he is taking levothyroxine 88 mcg daily on an empty stomach.  Although, it looks like he may have been out of this medicine for a few months now.  Of note, he did miss quite a few doses of this medication back when he was dealing with the esophageal ulcer and was having  significant difficulty swallowing and eating. Thyroid control status: Unknown Satisfied with current treatment? yes Medication side effects: no Medication compliance: fair compliance Recent dose adjustment:no Fatigue: no Cold intolerance: no Heat intolerance: no Weight gain: no Weight loss: yes Constipation: yes Diarrhea/loose stools: no Palpitations: no Lower extremity edema: no Anxiety/depressed mood: no  Allergies  Allergen Reactions   Penicillin G Other (See Comments)    Unknown childhood reaction    Outpatient Encounter Medications as of 10/04/2021  Medication Sig Note   aspirin EC 81 MG tablet Take 81 mg by mouth daily. Swallow whole.    cetirizine (ZYRTEC) 10 MG tablet Take 1 tablet (10 mg total) by mouth daily.    dexamethasone (DECADRON) 2 MG tablet Take 10 mg (5 tabs) at 10pm night prior to first chemo and 6am morning of first chemo    diclofenac Sodium (VOLTAREN) 1 % GEL Apply to back three times as needed for pain    Ensure (ENSURE) Take 237 mLs by mouth See admin instructions. Strawberry flavor - drink one to two cans by mouth daily    famotidine (PEPCID) 40 MG tablet Take 1 tablet (40 mg total) by mouth 2 (two) times daily.    HYDROcodone-acetaminophen (HYCET) 7.5-325 mg/15 ml solution Take 10 mLs by mouth every 6 (six) hours as needed for moderate pain.    hydrocortisone 2.5 % cream Apply topically 2 (two) times daily. Apply to face (Patient taking differently: Apply 1 application topically 2 (two) times daily as needed (facial dry skin/itching).)  lansoprazole (PREVACID) 30 MG capsule Take 1 capsule (30 mg total) by mouth 2 (two) times daily before a meal. (Patient taking differently: Take 30 mg by mouth daily as needed (Heartburn).)    levothyroxine (SYNTHROID) 88 MCG tablet Take 1 tablet (88 mcg total) by mouth daily before breakfast.    lidocaine (LIDODERM) 5 % Place 1 patch onto the skin daily. Remove & Discard patch within 12 hours or as directed by MD     Multiple Vitamin (MULTIVITAMIN WITH MINERALS) TABS tablet Take 1 tablet by mouth daily.    pravastatin (PRAVACHOL) 40 MG tablet Take 40 mg by mouth daily.    prochlorperazine (COMPAZINE) 5 MG tablet Take 1 tablet (5 mg total) by mouth every 6 (six) hours as needed for nausea or vomiting.    protein supplement (RESOURCE BENEPROTEIN) 6 g POWD Take 1 Scoop (6 g total) by mouth 3 (three) times daily with meals.    simvastatin (ZOCOR) 80 MG tablet Take 1 tablet (80 mg total) by mouth at bedtime.    sucralfate (CARAFATE) 1 g tablet Take 1 tablet (1 g total) by mouth 4 (four) times daily -  with meals and at bedtime.    vitamin B-12 (CYANOCOBALAMIN) 1000 MCG tablet Take 1,000 mcg by mouth daily.    [DISCONTINUED] albuterol (VENTOLIN HFA) 108 (90 Base) MCG/ACT inhaler Inhale 2 puffs into the lungs every 4 (four) hours as needed for wheezing or shortness of breath.    [DISCONTINUED] umeclidinium bromide (INCRUSE ELLIPTA) 62.5 MCG/INH AEPB Inhale 1 puff into the lungs daily. 06/26/2021: Pt states that he is waiting for medicaid to pay for this   albuterol (VENTOLIN HFA) 108 (90 Base) MCG/ACT inhaler Inhale 2 puffs into the lungs every 4 (four) hours as needed for wheezing or shortness of breath.    No facility-administered encounter medications on file as of 10/04/2021.    Patient Active Problem List   Diagnosis Date Noted   Esophageal ulcer 08/28/2021   Protein-calorie malnutrition, severe 06/28/2021   Dehydration 06/26/2021   Esophageal mass 05/15/2021   Chest pain 05/15/2021   Rash 02/16/2021   Macrocytic anemia 01/04/2021   DDD (degenerative disc disease), lumbar 12/09/2020   Hypothyroidism 12/01/2018   Alcohol use 12/01/2018   Protein-calorie malnutrition ([place]) 03/25/2018   Carotid artery disease ([place]) 07/25/2016   Cerebrovascular disease or lesion 07/25/2016   Lytic bone lesions on xray 07/25/2016   C6 cervical fracture ([place]) 07/16/2016   C7 cervical fracture ([place]) 07/16/2016   Spleen  hematoma 07/16/2016   T1 vertebral fracture ([place]) 07/16/2016   Chronic bronchitis ([place]) 09/22/2013   GERD (gastroesophageal reflux disease) 09/22/2013    Past Medical History:  Diagnosis Date   Anemia    Asthma    Cancer ([place])    Fall from height of greater than 3 feet 07/16/2016   GERD (gastroesophageal reflux disease)    Leg cramp 01/04/2021    Relevant past medical, surgical, family and social history reviewed and updated as indicated. Interim medical history since our last visit reviewed.  Review of Systems Per HPI unless specifically indicated above     Objective:    BP 112/68    Pulse 96    Ht 5\' 5"  (1.651 m)    Wt 101 lb (45.8 kg)    SpO2 93%    BMI 16.81 kg/m   Wt Readings from Last 3 Encounters:  10/04/21 101 lb (45.8 kg)  08/31/21 97 lb 3.2 oz (44.1 kg)  08/28/21 97 lb 3.2 oz (44.1  kg)    Physical Exam Vitals and nursing note reviewed.  Constitutional:      General: He is not in acute distress.    Appearance: Normal appearance. He is not toxic-appearing.     Comments: Cachectic  HENT:     Head: Normocephalic and atraumatic.     Mouth/Throat:     Mouth: Mucous membranes are moist.     Pharynx: Oropharynx is clear.  Eyes:     General: No scleral icterus.       Right eye: No discharge.        Left eye: No discharge.     Extraocular Movements: Extraocular movements intact.  Cardiovascular:     Rate and Rhythm: Normal rate and regular rhythm.  Pulmonary:     Effort: Pulmonary effort is normal. No respiratory distress.     Breath sounds: Normal breath sounds. No wheezing, rhonchi or rales.  Abdominal:     General: Abdomen is flat. Bowel sounds are normal. There is no distension.     Palpations: Abdomen is soft. There is no mass.     Tenderness: There is no abdominal tenderness. There is no guarding.  Skin:    General: Skin is warm and dry.     Coloration: Skin is not jaundiced or pale.     Findings: No erythema.  Neurological:     Mental Status: He is  alert and oriented to person, place, and time.     Motor: No weakness.     Gait: Gait normal.  Psychiatric:        Mood and Affect: Mood normal.        Behavior: Behavior normal.        Thought Content: Thought content normal.        Judgment: Judgment normal.       Assessment & Plan:   Problem List Items Addressed This Visit       Respiratory   Chronic bronchitis ([place]) - Primary    Chronic.  Patient is unable to afford inhalers-we will consult with pharmacy team.  He is not using Spiriva anymore.  He has not needed rescue inhaler recently, however I did remind him that if he starts using more frequently, I do want him to let me know.  Continue with current plan- follow-up 4 months or sooner if breathing worsens.      Relevant Medications   albuterol (VENTOLIN HFA) 108 (90 Base) MCG/ACT inhaler   Other Relevant Orders   AMB Referral to [facility] Coordinaton     Digestive   GERD (gastroesophageal reflux disease)    Chronic.  Continue collaboration with GI.  Check blood counts today.        Relevant Orders   CBC with Differential (Completed)   Esophageal ulcer    Chronic.  Continue collaboration with GI.        Endocrine   Hypothyroidism    Chronic.  We will check TSH today.  Patient reports he has been taking levothyroxine more consistently, however it is difficult to determine how much this has been.  He is somewhat of a poor historian in regard to his medication.  I worry about increasing levothyroxine in this frail patient and sending him into thyroid storm.  Follow-up 4 months.      Relevant Orders   TSH (Completed)   Other Visit Diagnoses     Screening for colon cancer       He adamantly declines screening for colon cancer with a colonoscopy.  He is agreeable to Cologuard-he has done this in the past.  We will order today.   Relevant Orders   Cologuard        Follow up plan: Return in about 4 months (around 02/02/2022) for follow up.

## 2021-10-06 MED ORDER — SUCRALFATE 1 G PO TABS: 1.0000 g | ORAL_TABLET | Freq: Three times a day (TID) | ORAL | 0 refills | Status: DC

## 2021-10-06 MED ORDER — LANSOPRAZOLE 30 MG PO CPDR: 30.0000 mg | DELAYED_RELEASE_CAPSULE | Freq: Two times a day (BID) | ORAL | 6 refills | Status: DC

## 2021-10-06 NOTE — Addendum Note (Signed)
Addended by: Jaynie Crumble on: 10/06/2021 12:06 PM   Modules accepted: Orders

## 2021-10-10 ENCOUNTER — Encounter: Payer: Self-pay | Admitting: Nurse Practitioner

## 2021-10-10 ENCOUNTER — Telehealth: Payer: Self-pay | Admitting: Nurse Practitioner

## 2021-10-10 ENCOUNTER — Telehealth: Payer: Self-pay

## 2021-10-10 NOTE — Telephone Encounter (Signed)
Lansoprazole rx was approved but still cost $86.00 for 30 day supply.  Is there an alternative?

## 2021-10-10 NOTE — Progress Notes (Signed)
(  Key: [id number])  This request has been approved.  Please note any additional information provided by [employer] Part D at the bottom of your screen.

## 2021-10-10 NOTE — Progress Notes (Signed)
°  Chronic Care Management   Outreach Note  10/10/2021 Name: ONEY FOLZ MRN: [id number] DOB: 1946-08-26  Referred by: Eulogio Bear, NP Reason for referral : No chief complaint on file.   An unsuccessful telephone outreach was attempted today. The patient was referred to the pharmacist for assistance with care management and care coordination.   Follow Up Plan:   Tatjana Dellinger Upstream Scheduler

## 2021-10-11 NOTE — Telephone Encounter (Signed)
He can use Goodrx coupon - looks like 90 day supply is $5

## 2021-10-11 NOTE — Telephone Encounter (Signed)
Informed patient to use Good Rx for discounted medication.

## 2021-10-12 ENCOUNTER — Telehealth: Payer: Self-pay | Admitting: Nurse Practitioner

## 2021-10-12 NOTE — Progress Notes (Signed)
°  Chronic Care Management   Note  10/12/2021 Name: Lee Wilson MRN: [id number] DOB: October 18, 1945  Lee Wilson is a 75 y.o. year old male who is a primary care patient of Eulogio Bear, NP. I reached out to Lee Wilson by phone today in response to a referral sent by Lee Wilson's PCP, Eulogio Bear, NP.   Lee Wilson was given information about Chronic Care Management services today including:  CCM service includes personalized support from designated clinical staff supervised by his physician, including individualized plan of care and coordination with other care providers 24/7 contact phone numbers for assistance for urgent and routine care needs. Service will only be billed when office clinical staff spend 20 minutes or more in a month to coordinate care. Only one practitioner may furnish and bill the service in a calendar month. The patient may stop CCM services at any time (effective at the end of the month) by phone call to the office staff.   Patient agreed to services and verbal consent obtained.   Follow up plan: PT AWARE HE MAY HAVE COPAY WITH NEW INS.   Tatjana Secretary/administrator

## 2021-10-15 ENCOUNTER — Encounter: Payer: Self-pay | Admitting: Oncology

## 2021-10-19 ENCOUNTER — Ambulatory Visit (HOSPITAL_COMMUNITY): Admission: RE | Admit: 2021-10-19 | Payer: Medicare HMO | Source: Ambulatory Visit

## 2021-10-19 ENCOUNTER — Encounter: Payer: Self-pay | Admitting: Oncology

## 2021-10-19 ENCOUNTER — Ambulatory Visit (HOSPITAL_COMMUNITY): Payer: Medicare HMO

## 2021-10-19 ENCOUNTER — Telehealth: Payer: Self-pay | Admitting: Nurse Practitioner

## 2021-10-19 NOTE — Telephone Encounter (Signed)
Received call from patient's sister in law [place]. States patient needs new referral for chest CT scan at [facility]; received call from hospital stating new referral needed. Patient has new insurance.   Current insurance; [employer] ID # [id number] Plan # [id number] Member Provider Services: [phone]  Please advise at [phone]

## 2021-10-19 NOTE — Telephone Encounter (Signed)
Attempted to reach patient and advise he contact ordering physician regarding CT.

## 2021-11-21 NOTE — Progress Notes (Unsigned)
Chronic Care Management Pharmacy Note  11/21/2021 Name:  Lee Wilson MRN:  [id number] DOB:  08/07/1946  Recent office visits:  10/04/21 Noemi Chapel, NP - Family Medicine - Chronic Bronchitis - Labs were ordered. Referral to [facility]. No medication changes. Follow up in 4 months.    06/09/21 Noemi Chapel, NP - Family Medicine - Chronic Bronchitis - Labs were ordered. No medication changes. Follow up in 4 months.    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 lansoprazole (PREVACID) 30 MG capsule prescribed. Follow up in 1 week.      Recent consult visits:  08/28/21 Montez Morita, MD - Gastroenterology - EGD ordered and scheduled. Continue Prevacid 30 mg twice a day. Follow up as scheduled.    Hospital visits: 09/05/21 Medication Reconciliation was completed by comparing discharge summary, patients EMR and Pharmacy list, and upon discussion with patient.   Admitted to the hospital on 09/05/21 due to EGD. Discharge date was 09/05/21. Discharged from [place]?Medications Started at [facility] Discharge:?? famotidine (PEPCID    Medication Changes at Hospital Discharge: None noted.    Medications Discontinued at Hospital Discharge: None noted   Medications that remain the same after Hospital Discharge:??  All other medications will remain the same.       Hospital visits: 06/26/21 - 06/29/21 Medication Reconciliation was completed by comparing discharge summary, patients EMR and Pharmacy list, and upon discussion with patient.   Admitted to the hospital on 06/26/21 due to Esophageal mass . Discharge date was 06/29/21. Discharged from [place]?Medications Started at [facility] Discharge:?? multivitamin with minerals protein supplement (RESOURCE BENEPROTEIN)   Medication Changes at Hospital Discharge: None noted.    Medications Discontinued at Hospital Discharge: None  noted.   Medications that remain the same after Hospital Discharge:??  All other medications will remain the same.    Subjective: Lee Wilson is an 76 y.o. year old male who is a primary patient of Eulogio Bear, NP.  The CCM team was consulted for assistance with disease management and care coordination needs.    Engaged with patient face to face for initial visit in response to provider referral for pharmacy case management and/or care coordination services.   Consent to Services:  The patient was given the following information about Chronic Care Management services today, agreed to services, and gave verbal consent: 1. CCM service includes personalized support from designated clinical staff supervised by the primary care provider, including individualized plan of care and coordination with other care providers 2. 24/7 contact phone numbers for assistance for urgent and routine care needs. 3. Service will only be billed when office clinical staff spend 20 minutes or more in a month to coordinate care. 4. Only one practitioner may furnish and bill the service in a calendar month. 5.The patient may stop CCM services at any time (effective at the end of the month) by phone call to the office staff. 6. The patient will be responsible for cost sharing (co-pay) of up to 20% of the service fee (after annual deductible is met). Patient agreed to services and consent obtained.  Patient Care Team: Eulogio Bear, NP as PCP - General (Nurse Practitioner) Alda Berthold, DO as Consulting Physician (Neurology) Edythe Clarity, [facility] as Pharmacist (Pharmacist)  Recent office visits: ***  Recent consult visits: [facility] visits: {Hospital DC Yes/No:25215}   Objective:  Lab Results  Component Value Date   CREATININE 0.86 06/26/2021   BUN 12 06/26/2021   EGFR 75 05/15/2021   GFRNONAA >60 06/26/2021   GFRAA >89 01/14/2015   NA 140 06/26/2021   K 3.8 06/26/2021   CALCIUM  9.2 06/26/2021   CO2 27 06/26/2021   GLUCOSE 95 06/26/2021    No results found for: HGBA1C, FRUCTOSAMINE, GFR, MICROALBUR  Last diabetic Eye exam: No results found for: HMDIABEYEEXA  Last diabetic Foot exam: No results found for: HMDIABFOOTEX   Lab Results  Component Value Date   CHOL 212 (H) 06/09/2021   HDL 45 06/09/2021   LDLCALC 136 (H) 06/09/2021   TRIG 168 (H) 06/09/2021   CHOLHDL 4.7 06/09/2021    Hepatic Function Latest Ref Rng & Units 06/26/2021 06/07/2021 05/15/2021  Total Protein 6.5 - 8.1 g/dL 6.9 7.5 7.1  Albumin 3.5 - 5.0 g/dL 3.5 3.5 -  AST 15 - 41 U/L 18 16 19   ALT 0 - 44 U/L 9 9 14   Alk Phosphatase 38 - 126 U/L 47 46 -  Total Bilirubin 0.3 - 1.2 mg/dL 0.7 0.7 0.5    Lab Results  Component Value Date/Time   TSH 10.99 (H) 10/04/2021 02:18 PM   TSH 4.93 (H) 06/09/2021 12:01 PM   FREET4 1.1 12/09/2019 10:50 AM   FREET4 1.0 12/01/2018 11:19 AM    CBC Latest Ref Rng & Units 10/04/2021 08/31/2021 06/26/2021  WBC 3.8 - 10.8 Thousand/uL 7.6 6.9 6.0  Hemoglobin 13.2 - 17.1 g/dL 13.2 12.7(L) 11.6(L)  Hematocrit 38.5 - 50.0 % 37.2(L) 35.5(L) 33.3(L)  Platelets 140 - 400 Thousand/uL 176 161 185    No results found for: VD25OH  Clinical ASCVD: {YES/NO:21197} The 10-year ASCVD risk score (Arnett DK, et al., 2019) is: 18.9%   Values used to calculate the score:     Age: 27 years     Sex: Male     Is Non-Hispanic African American: No     Diabetic: No     Tobacco smoker: No     Systolic Blood Pressure: 856 mmHg     Is BP treated: No     HDL Cholesterol: 45 mg/dL     Total Cholesterol: 212 mg/dL    Depression screen [facility] 2/9 12/09/2020 06/08/2020 12/09/2019  Decreased Interest 0 0 0  Down, Depressed, Hopeless 0 0 0  PHQ - 2 Score 0 0 0  Altered sleeping 0 - 0  Tired, decreased energy 0 - 0  Change in appetite 0 - 0  Feeling bad or failure about yourself  0 - 0  Trouble concentrating 0 - 0  Moving slowly or fidgety/restless 0 - 0  Suicidal thoughts 0 - 0  PHQ-9  Score 0 - 0  Difficult doing work/chores Not difficult at all - Not difficult at all     ***Other: (CHADS2VASc if Afib, MMRC or CAT for COPD, ACT, DEXA)  Social History   Tobacco Use  Smoking Status Former  Smokeless Tobacco Never   BP Readings from Last 3 Encounters:  10/04/21 112/68  09/05/21 (!) 96/55  08/31/21 (!) 142/73   Pulse Readings from Last 3 Encounters:  10/04/21 96  09/05/21 (!) 54  08/31/21 81   Wt Readings from Last 3 Encounters:  10/04/21 101 lb (45.8 kg)  08/31/21 97 lb 3.2 oz (44.1 kg)  08/28/21 97 lb 3.2 oz (44.1 kg)   BMI Readings from Last 3 Encounters:  10/04/21 16.81 kg/m  08/31/21 16.18 kg/m  08/28/21 16.17 kg/m  Assessment/Interventions: Review of patient past medical history, allergies, medications, health status, including review of consultants reports, laboratory and other test data, was performed as part of comprehensive evaluation and provision of chronic care management services.   SDOH:  (Social Determinants of Health) assessments and interventions performed: {yes/no:20286}  SDOH Screenings   Alcohol Screen: Low Risk    Last Alcohol Screening Score (AUDIT): 3  Depression (PHQ2-9): Low Risk    PHQ-2 Score: 0  Financial Resource Strain: Low Risk    Difficulty of Paying Living Expenses: Not hard at all  Food Insecurity: No Food Insecurity   Worried About Charity fundraiser in the Last Year: Never true   Ran Out of Food in the Last Year: Never true  Housing: Low Risk    Last Housing Risk Score: 0  Physical Activity: Not on file  Social Connections: Socially Isolated   Frequency of Communication with Friends and Family: More than three times a week   Frequency of Social Gatherings with Friends and Family: More than three times a week   Attends Religious Services: Never   Marine scientist or Organizations: No   Attends Archivist Meetings: Never   Marital Status: Widowed  Stress: No Stress Concern Present    Feeling of Stress : Only a little  Tobacco Use: Medium Risk   Smoking Tobacco Use: Former   Smokeless Tobacco Use: Never   Passive Exposure: Not on file  Transportation Needs: No Transportation Needs   Lack of Transportation (Medical): No   Lack of Transportation (Non-Medical): No    CCM Care Plan  Allergies  Allergen Reactions   Penicillin G Other (See Comments)    Unknown childhood reaction    Medications Reviewed Today     Reviewed by Amado Coe, CMA (Certified Medical Assistant) on 10/04/21 at 1356  Med List Status: <None>   Medication Order Taking? Sig Documenting Provider Last Dose Status Informant  albuterol (VENTOLIN HFA) 108 (90 Base) MCG/ACT inhaler [id number] Yes Inhale 2 puffs into the lungs every 4 (four) hours as needed for wheezing or shortness of breath. Eulogio Bear, NP Taking Active Spouse/Significant Other  aspirin EC 81 MG tablet [id number] Yes Take 81 mg by mouth daily. Swallow whole. [provider] Taking Active Spouse/Significant Other  cetirizine (ZYRTEC) 10 MG tablet [id number] Yes Take 1 tablet (10 mg total) by mouth daily. Buelah Manis, Modena Nunnery, MD Taking Active Spouse/Significant Other  dexamethasone (DECADRON) 2 MG tablet [id number] Yes Take 10 mg (5 tabs) at 10pm night prior to first chemo and 6am morning of first chemo Ladell Pier, MD Taking Active Spouse/Significant Other  diclofenac Sodium (VOLTAREN) 1 % GEL [id number] Yes Apply to back three times as needed for pain Buelah Manis, Modena Nunnery, MD Taking Active Spouse/Significant Other  Ensure [facility]) [id number] Yes Take 237 mLs by mouth See admin instructions. Strawberry flavor - drink one to two cans by mouth daily [provider] Taking Active Spouse/Significant Other  famotidine (PEPCID) 40 MG tablet [id number] Yes Take 1 tablet (40 mg total) by mouth 2 (two) times daily. Montez Morita, Quillian Quince, MD Taking Active   HYDROcodone-acetaminophen (HYCET) 7.5-325 mg/15 ml solution  [id number] Yes Take 10 mLs by mouth every 6 (six) hours as needed for moderate pain. Maryanna Shape, NP Taking Active Spouse/Significant Other  hydrocortisone 2.5 % cream [id number] Yes Apply topically 2 (two) times daily. Apply to face  Patient taking differently: Apply 1 application topically 2 (two) times daily as needed (facial dry  skin/itching).   Eulogio Bear, NP Taking Active Spouse/Significant Other  lansoprazole (PREVACID) 30 MG capsule [id number] Yes Take 1 capsule (30 mg total) by mouth 2 (two) times daily before a meal.  Patient taking differently: Take 30 mg by mouth daily as needed (Heartburn).   Mansouraty, Telford Nab., MD Taking Active Spouse/Significant Other  levothyroxine (SYNTHROID) 88 MCG tablet [id number] Yes Take 1 tablet (88 mcg total) by mouth daily before breakfast. Eulogio Bear, NP Taking Active Spouse/Significant Other  lidocaine (LIDODERM) 5 % [id number] Yes Place 1 patch onto the skin daily. Remove & Discard patch within 12 hours or as directed by MD Eulogio Bear, NP Taking Active Spouse/Significant Other  Multiple Vitamin (MULTIVITAMIN WITH MINERALS) TABS tablet [id number] Yes Take 1 tablet by mouth daily. Maryanna Shape, NP Taking Active Spouse/Significant Other  pravastatin (PRAVACHOL) 40 MG tablet [id number] Yes Take 40 mg by mouth daily. [provider] Taking Active Spouse/Significant Other  prochlorperazine (COMPAZINE) 5 MG tablet [id number] Yes Take 1 tablet (5 mg total) by mouth every 6 (six) hours as needed for nausea or vomiting. Owens Shark, NP Taking Active Spouse/Significant Other  protein supplement (RESOURCE BENEPROTEIN) 6 g POWD [id number] Yes Take 1 Scoop (6 g total) by mouth 3 (three) times daily with meals. Maryanna Shape, NP Taking Active Spouse/Significant Other  simvastatin (ZOCOR) 80 MG tablet [id number] Yes Take 1 tablet (80 mg total) by mouth at bedtime. Eulogio Bear, NP Taking Active Spouse/Significant Other   sucralfate (CARAFATE) 1 g tablet [id number] Yes Take 1 tablet (1 g total) by mouth 4 (four) times daily -  with meals and at bedtime. Montez Morita, Quillian Quince, MD Taking Active   umeclidinium bromide (INCRUSE ELLIPTA) 62.5 MCG/INH AEPB [id number] Yes Inhale 1 puff into the lungs daily. Eulogio Bear, NP Taking Active Spouse/Significant Other           Med Note Orvan Seen, Tally Joe Jun 26, 2021  5:24 PM) Pt states that he is waiting for medicaid to pay for this  vitamin B-12 (CYANOCOBALAMIN) 1000 MCG tablet [id number] Yes Take 1,000 mcg by mouth daily. 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   Chest pain 05/15/2021   Rash 02/16/2021   Macrocytic anemia 01/04/2021   DDD (degenerative disc disease), lumbar 12/09/2020   Hypothyroidism 12/01/2018   Alcohol use 12/01/2018   Protein-calorie malnutrition ([place]) 03/25/2018   Carotid artery disease ([place]) 07/25/2016   Cerebrovascular disease or lesion 07/25/2016   Lytic bone lesions on xray 07/25/2016   C6 cervical fracture ([place]) 07/16/2016   C7 cervical fracture ([place]) 07/16/2016   Spleen hematoma 07/16/2016   T1 vertebral fracture ([place]) 07/16/2016   Chronic bronchitis ([place]) 09/22/2013   GERD (gastroesophageal reflux disease) 09/22/2013    Immunization History  Administered Date(s) Administered   Fluad Quad(high Dose 65+) 12/09/2019, 12/09/2020, 06/27/2021   Influenza, High Dose Seasonal PF 09/16/2017, 12/01/2018   PFIZER(Purple Top)SARS-COV-2 Vaccination 03/22/2020, 04/21/2020   Pneumococcal Conjugate-13 07/25/2016   Pneumococcal Polysaccharide-23 01/14/2015   Tdap 05/25/2017    Conditions to be addressed/monitored:  CAD, GERD, Hypothyroidism,   There are no care plans that you recently modified to  display for this patient.    Medication Assistance: {MEDASSISTANCEINFO:25044}  Compliance/Adherence/Medication fill history: Care Gaps: ***  Star-Rating Drugs: ***  Patient's preferred pharmacy is:  [facility] [place], [place] AT [address] [place] [address] Phone: [phone] Fax: [phone]  Uses pill box? {Yes or If no, why not?:20788} Pt endorses ***% compliance  We discussed: {Pharmacy options:24294} Patient decided to: {US Pharmacy Plan:23885}  Care Plan and Follow Up Patient Decision:  {FOLLOWUP:24991}  Plan: {CM FOLLOW UP YUWC:91675}  ***  Current Barriers:  {pharmacybarriers:24917}  Pharmacist Clinical Goal(s):  Patient will {PHARMACYGOALCHOICES:24921} through collaboration with PharmD and provider.   Interventions: 1:1 collaboration with Eulogio Bear, NP regarding development and update of comprehensive plan of care as evidenced by provider attestation and co-signature Inter-disciplinary care team collaboration (see longitudinal plan of care) Comprehensive medication review performed; medication list updated in electronic medical record  GERD (Goal: ***) -{US controlled/uncontrolled:25276} -Current treatment  *** -Medications previously tried: ***  -{CCMPHARMDINTERVENTION:25122}  Hypothyroidism (Goal: ***) -{US controlled/uncontrolled:25276} -Current treatment  *** -Medications previously tried: ***  -{CCMPHARMDINTERVENTION:25122}  Hyperlipidemia: (LDL goal < ***) -{US controlled/uncontrolled:25276} -Current treatment: *** -Medications previously tried: ***  -Current dietary patterns: *** -Current exercise habits: *** -Educated on {CCM HLD Counseling:25126} -{CCMPHARMDINTERVENTION:25122}   Patient Goals/Self-Care Activities Patient will:  - {pharmacypatientgoals:24919}  Follow Up Plan: {CM FOLLOW UP UDIL:48323}

## 2021-11-28 ENCOUNTER — Telehealth: Payer: Self-pay | Admitting: Pharmacist

## 2021-11-28 NOTE — Progress Notes (Signed)
Chronic Care Management Pharmacy Assistant   Name: Lee Wilson  MRN: [id number] DOB: 12-28-1945  Lee Wilson is an 76 y.o. year old male who presents for his initial CCM visit with the clinical pharmacist.  Reason for Encounter: Chart Prep for initial visit with CPP on 11/30/21 @ 9:00am    Recent office visits:  10/04/21 Lee Chapel, NP - Family Medicine - Chronic Bronchitis - Labs were ordered. Referral to [facility]. No medication changes. Follow up in 4 months.   06/09/21 Lee Chapel, NP - Family Medicine - Chronic Bronchitis - Labs were ordered. No medication changes. Follow up in 4 months.   06/07/21 Lee Card, NP - Hematology/Oncology - Esophageal mass - Oncology treatment performed. lansoprazole (PREVACID) 30 MG capsule prescribed. Follow up in 1 week.    Recent consult visits:  08/28/21 Lee Morita, MD - Gastroenterology - EGD ordered and scheduled. Continue Prevacid 30 mg twice a day. Follow up as scheduled.   Hospital visits: 09/05/21 Medication Reconciliation was completed by comparing discharge summary, patients EMR and Pharmacy list, and upon discussion with patient.  Admitted to the hospital on 09/05/21 due to EGD. Discharge date was 09/05/21. Discharged from [place]?Medications Started at [facility] Discharge:?? famotidine (PEPCID   Medication Changes at Hospital Discharge: None noted.   Medications Discontinued at Hospital Discharge: None noted  Medications that remain the same after Hospital Discharge:??  All other medications will remain the same.     Hospital visits: 06/26/21 - 06/29/21 Medication Reconciliation was completed by comparing discharge summary, patients EMR and Pharmacy list, and upon discussion with patient.  Admitted to the hospital on 06/26/21 due to Esophageal mass . Discharge date was 06/29/21. Discharged from [place]?Medications Started at [facility]  Discharge:?? multivitamin with minerals protein supplement (RESOURCE BENEPROTEIN)  Medication Changes at Hospital Discharge: None noted.   Medications Discontinued at Hospital Discharge: None noted.  Medications that remain the same after Hospital Discharge:??  All other medications will remain the same.    Medications: Outpatient Encounter Medications as of 11/28/2021  Medication Sig   albuterol (VENTOLIN HFA) 108 (90 Base) MCG/ACT inhaler Inhale 2 puffs into the lungs every 4 (four) hours as needed for wheezing or shortness of breath.   aspirin EC 81 MG tablet Take 81 mg by mouth daily. Swallow whole.   cetirizine (ZYRTEC) 10 MG tablet Take 1 tablet (10 mg total) by mouth daily.   dexamethasone (DECADRON) 2 MG tablet Take 10 mg (5 tabs) at 10pm night prior to first chemo and 6am morning of first chemo   diclofenac Sodium (VOLTAREN) 1 % GEL Apply to back three times as needed for pain   Ensure (ENSURE) Take 237 mLs by mouth See admin instructions. Strawberry flavor - drink one to two cans by mouth daily   famotidine (PEPCID) 40 MG tablet Take 1 tablet (40 mg total) by mouth 2 (two) times daily.   HYDROcodone-acetaminophen (HYCET) 7.5-325 mg/15 ml solution Take 10 mLs by mouth every 6 (six) hours as needed for moderate pain.   hydrocortisone 2.5 % cream Apply topically 2 (two) times daily. Apply to face (Patient taking differently: Apply 1 application topically 2 (two) times daily as needed (facial dry skin/itching).)   lansoprazole (PREVACID) 30 MG capsule Take 1 capsule (30 mg total) by mouth 2 (two) times daily before a meal.   levothyroxine (SYNTHROID) 88 MCG tablet Take 1 tablet (88 mcg total) by mouth daily before  breakfast.   lidocaine (LIDODERM) 5 % Place 1 patch onto the skin daily. Remove & Discard patch within 12 hours or as directed by MD   Multiple Vitamin (MULTIVITAMIN WITH MINERALS) TABS tablet Take 1 tablet by mouth daily.   pravastatin (PRAVACHOL) 40 MG tablet Take 40 mg  by mouth daily.   prochlorperazine (COMPAZINE) 5 MG tablet Take 1 tablet (5 mg total) by mouth every 6 (six) hours as needed for nausea or vomiting.   protein supplement (RESOURCE BENEPROTEIN) 6 g POWD Take 1 Scoop (6 g total) by mouth 3 (three) times daily with meals.   simvastatin (ZOCOR) 80 MG tablet Take 1 tablet (80 mg total) by mouth at bedtime.   sucralfate (CARAFATE) 1 g tablet Take 1 tablet (1 g total) by mouth 4 (four) times daily -  with meals and at bedtime.   vitamin B-12 (CYANOCOBALAMIN) 1000 MCG tablet Take 1,000 mcg by mouth daily.   No facility-administered encounter medications on file as of 11/28/2021.     Have you seen any other providers since your last visit?  Any changes in your medications or health?   Any side effects from any medications?   Do you have an symptoms or problems not managed by your medications  Any concerns about your health right now?   Has your provider asked that you check blood pressure, blood sugar, or follow special diet at home?   Do you get any type of exercise on a regular basis?   Can you think of a goal you would like to reach for your health?   Do you have any problems getting your medications?   Is there anything that you would like to discuss during the appointment?  Please bring medications and supplements to appointment    Care Gaps  AWV: unknown Colonoscopy: upper Endo done 09/05/21 DM Eye Exam: N/A DM Foot Exam: N/A Microalbumin: N/A HbgAIC: N/A DEXA:  N/A Mammogram: N/A  Star Rating Drugs: simvastatin (ZOCOR) 80 MG tablet - last filled  03/30/21 90 days    Future Appointments  Date Time Provider [place]  11/30/2021  9:00 AM [facility] PHARMACIST [facility] None   Multiple attempts were made to contact patient. Attempts were unsuccessful. / ls,CMA   Jobe Gibbon, [place] Pharmacist Assistant  [phone]

## 2021-11-30 ENCOUNTER — Telehealth: Payer: Self-pay | Admitting: Pharmacist

## 2021-11-30 ENCOUNTER — Ambulatory Visit: Payer: Medicare Other

## 2021-11-30 NOTE — Progress Notes (Signed)
Erroneous Encounter

## 2021-12-06 DIAGNOSIS — K2270 Barrett's esophagus without dysplasia: Secondary | ICD-10-CM | POA: Diagnosis not present

## 2021-12-06 DIAGNOSIS — K449 Diaphragmatic hernia without obstruction or gangrene: Secondary | ICD-10-CM | POA: Diagnosis not present

## 2021-12-06 DIAGNOSIS — K2970 Gastritis, unspecified, without bleeding: Secondary | ICD-10-CM | POA: Diagnosis not present

## 2021-12-08 DIAGNOSIS — K2270 Barrett's esophagus without dysplasia: Secondary | ICD-10-CM | POA: Diagnosis not present

## 2021-12-14 ENCOUNTER — Other Ambulatory Visit (INDEPENDENT_AMBULATORY_CARE_PROVIDER_SITE_OTHER): Payer: Self-pay | Admitting: Gastroenterology

## 2021-12-14 ENCOUNTER — Telehealth (INDEPENDENT_AMBULATORY_CARE_PROVIDER_SITE_OTHER): Payer: Self-pay

## 2021-12-14 NOTE — Telephone Encounter (Signed)
? ? ?  Medication Refill ?(Newest Message First) ?Montez Morita, Quillian Quince, MD routed conversation to You 17 minutes ago (2:55 PM)  ? ? ?Will refill medication for now, but please schedule him for a repeat EGD in next couple of weeks. Dx: esophageal ulcer, room 3  ?  ?  ?Note   ? ?

## 2021-12-14 NOTE — Telephone Encounter (Signed)
Will refill medication for now, but please schedule him for a repeat EGD in next couple of weeks. Dx: esophageal ulcer, room 3 ?

## 2021-12-14 NOTE — Telephone Encounter (Signed)
Made into a phone message and submitted to [facility]. ?

## 2021-12-20 ENCOUNTER — Other Ambulatory Visit (INDEPENDENT_AMBULATORY_CARE_PROVIDER_SITE_OTHER): Payer: Self-pay

## 2021-12-20 DIAGNOSIS — K2210 Ulcer of esophagus without bleeding: Secondary | ICD-10-CM

## 2021-12-21 ENCOUNTER — Encounter (INDEPENDENT_AMBULATORY_CARE_PROVIDER_SITE_OTHER): Payer: Self-pay

## 2021-12-25 ENCOUNTER — Telehealth: Payer: Self-pay | Admitting: Pharmacist

## 2021-12-25 NOTE — Progress Notes (Unsigned)
Chronic Care Management Pharmacy Assistant   Name: Lee Wilson  MRN: [id number] DOB: 1945/10/28   Lee Wilson is an 76 y.o. year old male who presents for his initial CCM visit with the clinical pharmacist.   Reason for Encounter: Chart Prep for Initial Visit with CPP    Recent office visits:  10/04/21 Noemi Chapel, NP - Family Medicine - Chronic Bronchitis - Labs were ordered. Referral to [facility]. No medication changes. Follow up in 4 months.    06/09/21 Noemi Chapel, NP - Family Medicine - Chronic Bronchitis - Labs were ordered. No medication changes. Follow up in 4 months.    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 lansoprazole (PREVACID) 30 MG capsule prescribed. Follow up in 1 week.      Recent consult visits:  08/28/21 Montez Morita, MD - Gastroenterology - EGD ordered and scheduled. Continue Prevacid 30 mg twice a day. Follow up as scheduled.    Hospital visits: 09/05/21 Medication Reconciliation was completed by comparing discharge summary, patients EMR and Pharmacy list, and upon discussion with patient.   Admitted to the hospital on 09/05/21 due to EGD. Discharge date was 09/05/21. Discharged from [place]?Medications Started at [facility] Discharge:?? famotidine (PEPCID    Medication Changes at Hospital Discharge: None noted.    Medications Discontinued at Hospital Discharge: None noted   Medications that remain the same after Hospital Discharge:??  All other medications will remain the same.       Hospital visits: 06/26/21 - 06/29/21 Medication Reconciliation was completed by comparing discharge summary, patients EMR and Pharmacy list, and upon discussion with patient.   Admitted to the hospital on 06/26/21 due to Esophageal mass . Discharge date was 06/29/21. Discharged from [place]?Medications Started at [facility]  Discharge:?? multivitamin with minerals protein supplement (RESOURCE BENEPROTEIN)   Medication Changes at Hospital Discharge: None noted.    Medications Discontinued at Hospital Discharge: None noted.   Medications that remain the same after Hospital Discharge:??  All other medications will remain the same.    Medications: Outpatient Encounter Medications as of 12/25/2021  Medication Sig   albuterol (VENTOLIN HFA) 108 (90 Base) MCG/ACT inhaler Inhale 2 puffs into the lungs every 4 (four) hours as needed for wheezing or shortness of breath.   aspirin EC 81 MG tablet Take 81 mg by mouth daily. Swallow whole.   cetirizine (ZYRTEC) 10 MG tablet Take 1 tablet (10 mg total) by mouth daily.   dexamethasone (DECADRON) 2 MG tablet Take 10 mg (5 tabs) at 10pm night prior to first chemo and 6am morning of first chemo   diclofenac Sodium (VOLTAREN) 1 % GEL Apply to back three times as needed for pain   Ensure (ENSURE) Take 237 mLs by mouth See admin instructions. Strawberry flavor - drink one to two cans by mouth daily   famotidine (PEPCID) 40 MG tablet TAKE 1 TABLET(40 MG) BY MOUTH TWICE DAILY   HYDROcodone-acetaminophen (HYCET) 7.5-325 mg/15 ml solution Take 10 mLs by mouth every 6 (six) hours as needed for moderate pain.   hydrocortisone 2.5 % cream Apply topically 2 (two) times daily. Apply to face (Patient taking differently: Apply 1 application topically 2 (two) times daily as needed (facial dry skin/itching).)   lansoprazole (PREVACID) 30 MG capsule Take 1 capsule (30 mg total) by mouth 2 (two) times daily before a meal.   levothyroxine (SYNTHROID) 88 MCG  tablet Take 1 tablet (88 mcg total) by mouth daily before breakfast.   lidocaine (LIDODERM) 5 % Place 1 patch onto the skin daily. Remove & Discard patch within 12 hours or as directed by MD   Multiple Vitamin (MULTIVITAMIN WITH MINERALS) TABS tablet Take 1 tablet by mouth daily.   pravastatin (PRAVACHOL) 40 MG tablet Take 40 mg by mouth  daily.   prochlorperazine (COMPAZINE) 5 MG tablet Take 1 tablet (5 mg total) by mouth every 6 (six) hours as needed for nausea or vomiting.   protein supplement (RESOURCE BENEPROTEIN) 6 g POWD Take 1 Scoop (6 g total) by mouth 3 (three) times daily with meals.   simvastatin (ZOCOR) 80 MG tablet Take 1 tablet (80 mg total) by mouth at bedtime.   sucralfate (CARAFATE) 1 g tablet Take 1 tablet (1 g total) by mouth 4 (four) times daily -  with meals and at bedtime.   vitamin B-12 (CYANOCOBALAMIN) 1000 MCG tablet Take 1,000 mcg by mouth daily.   No facility-administered encounter medications on file as of 12/25/2021.    Have you seen any other providers since your last visit?   Any changes in your medications or health?    Any side effects from any medications?    Do you have an symptoms or problems not managed by your medications   Any concerns about your health right now?    Has your provider asked that you check blood pressure, blood sugar, or follow special diet at home?    Do you get any type of exercise on a regular basis?    Can you think of a goal you would like to reach for your health?    Do you have any problems getting your medications?    Is there anything that you would like to discuss during the appointment?   Please bring medications and supplements to appointment      Care Gaps   AWV: unknown Colonoscopy: upper Endo done 09/05/21 DM Eye Exam: N/A DM Foot Exam: N/A Microalbumin: N/A HbgAIC: N/A DEXA:  N/A Mammogram: N/A    Star Rating Drugs: simvastatin (ZOCOR) 80 MG tablet - last filled  03/30/21 90 days    Future Appointments  Date Time Provider [place]  12/28/2021  9:00 AM [facility] PHARMACIST [facility]  01/04/2022 12:45 PM [facility] PAT 1 [facility] None     Jobe Gibbon, [place] Clinical Pharmacist Assistant  [phone]

## 2021-12-27 NOTE — Progress Notes (Signed)
? ?Chronic Care Management ?Pharmacy Note ? ?12/28/2021 ?Name:  Lee Wilson MRN:  [id number] DOB:  July 21, 1946 ? ?Summary: ?Initial visit with PharmD.  Adherence is main concern with uncontrolled chronic conditions.  LDL elevated and TSH elevated.  Patient was taking multiple PPI and unclear if he is taking statin at this time. ? ?Recommendations/Changes made from today's visit: ?Upstream pill packs ? ?Plan: ?FU 90 days - he may be switching offices for PCP ? ? ?Subjective: ?Lee Wilson is an 76 y.o. year old male who is a primary patient of Eulogio Bear, NP.  The CCM team was consulted for assistance with disease management and care coordination needs.   ? ?Engaged with patient face to face for initial visit in response to provider referral for pharmacy case management and/or care coordination services.  ? ?Consent to Services:  ?The patient was given the following information about Chronic Care Management services today, agreed to services, and gave verbal consent: 1. CCM service includes personalized support from designated clinical staff supervised by the primary care provider, including individualized plan of care and coordination with other care providers 2. 24/7 contact phone numbers for assistance for urgent and routine care needs. 3. Service will only be billed when office clinical staff spend 20 minutes or more in a month to coordinate care. 4. Only one practitioner may furnish and bill the service in a calendar month. 5.The patient may stop CCM services at any time (effective at the end of the month) by phone call to the office staff. 6. The patient will be responsible for cost sharing (co-pay) of up to 20% of the service fee (after annual deductible is met). Patient agreed to services and consent obtained. ? ?Patient Care Team: ?Eulogio Bear, NP as PCP - General (Nurse Practitioner) ?Alda Berthold, DO as Consulting Physician (Neurology) ?Edythe Clarity, [facility] as Pharmacist  (Pharmacist) ? ?Recent office visits:  ?10/04/21 Noemi Chapel, NP - Family Medicine - Chronic Bronchitis - Labs were ordered. Referral to [facility]. No medication changes. Follow up in 4 months.  ?  ?06/09/21 Noemi Chapel, NP - Family Medicine - Chronic Bronchitis - Labs were ordered. No medication changes. Follow up in 4 months.  ?  ?06/07/21 Ned Card, NP - Hematology/Oncology - Esophageal mass - Oncology treatment performed. lansoprazole (PREVACID) 30 MG capsule prescribed. Follow up in 1 week.  ?  ?  ?Recent consult visits:  ?08/28/21 Montez Morita, MD - Gastroenterology - EGD ordered and scheduled. Continue Prevacid 30 mg twice a day. Follow up as scheduled.  ?  ?Hospital visits: 09/05/21 ?Medication Reconciliation was completed by comparing discharge summary, patient?s EMR and Pharmacy list, and upon discussion with patient. ?  ?Admitted to the hospital on 09/05/21 due to EGD. Discharge date was 09/05/21. Discharged from [facility].   ?  ?New?Medications Started at [facility] Discharge:?? ?famotidine (PEPCID  ?  ?Medication Changes at Hospital Discharge: ?None noted.  ?  ?Medications Discontinued at Hospital Discharge: ?None noted ?  ?Medications that remain the same after Hospital Discharge:??  ?All other medications will remain the same.   ?  ?  ?Hospital visits: 06/26/21 - 06/29/21 ?Medication Reconciliation was completed by comparing discharge summary, patient?s EMR and Pharmacy list, and upon discussion with patient. ?  ?Admitted to the hospital on 06/26/21 due to Esophageal mass . Discharge date was 06/29/21. Discharged from [facility]   ?  ?New?Medications Started at [facility] Discharge:?? ?multivitamin with minerals ?protein supplement (RESOURCE BENEPROTEIN) ?  ?  Medication Changes at Hospital Discharge: ?None noted.  ?  ?Medications Discontinued at Hospital Discharge: ?None noted. ?  ?Medications that remain the same after Hospital Discharge:??  ?All other medications will  remain the same.   ? ? ?Objective: ? ?Lab Results  ?Component Value Date  ? CREATININE 0.86 06/26/2021  ? BUN 12 06/26/2021  ? EGFR 75 05/15/2021  ? GFRNONAA >60 06/26/2021  ? GFRAA >89 01/14/2015  ? NA 140 06/26/2021  ? K 3.8 06/26/2021  ? CALCIUM 9.2 06/26/2021  ? CO2 27 06/26/2021  ? GLUCOSE 95 06/26/2021  ? ? ?No results found for: HGBA1C, FRUCTOSAMINE, GFR, MICROALBUR  ?Last diabetic Eye exam: No results found for: HMDIABEYEEXA  ?Last diabetic Foot exam: No results found for: HMDIABFOOTEX  ? ?Lab Results  ?Component Value Date  ? CHOL 212 (H) 06/09/2021  ? HDL 45 06/09/2021  ? LDLCALC 136 (H) 06/09/2021  ? TRIG 168 (H) 06/09/2021  ? CHOLHDL 4.7 06/09/2021  ? ? ?Hepatic Function Latest Ref Rng & Units 06/26/2021 06/07/2021 05/15/2021  ?Total Protein 6.5 - 8.1 g/dL 6.9 7.5 7.1  ?Albumin 3.5 - 5.0 g/dL 3.5 3.5 -  ?AST 15 - 41 U/L 18 16 19   ?ALT 0 - 44 U/L 9 9 14   ?Alk Phosphatase 38 - 126 U/L 47 46 -  ?Total Bilirubin 0.3 - 1.2 mg/dL 0.7 0.7 0.5  ? ? ?Lab Results  ?Component Value Date/Time  ? TSH 10.99 (H) 10/04/2021 02:18 PM  ? TSH 4.93 (H) 06/09/2021 12:01 PM  ? FREET4 1.1 12/09/2019 10:50 AM  ? FREET4 1.0 12/01/2018 11:19 AM  ? ? ?CBC Latest Ref Rng & Units 10/04/2021 08/31/2021 06/26/2021  ?WBC 3.8 - 10.8 Thousand/uL 7.6 6.9 6.0  ?Hemoglobin 13.2 - 17.1 g/dL 13.2 12.7(L) 11.6(L)  ?Hematocrit 38.5 - 50.0 % 37.2(L) 35.5(L) 33.3(L)  ?Platelets 140 - 400 Thousand/uL 176 161 185  ? ? ?No results found for: VD25OH ? ?Clinical ASCVD: Yes  ?The 10-year ASCVD risk score (Arnett DK, et al., 2019) is: 18.9% ?  Values used to calculate the score: ?    Age: 76 years ?    Sex: Male ?    Is Non-Hispanic African American: No ?    Diabetic: No ?    Tobacco smoker: No ?    Systolic Blood Pressure: 364 mmHg ?    Is BP treated: No ?    HDL Cholesterol: 45 mg/dL ?    Total Cholesterol: 212 mg/dL   ? ?Depression screen [facility] 2/9 12/09/2020 06/08/2020 12/09/2019  ?Decreased Interest 0 0 0  ?Down, Depressed, Hopeless 0 0 0  ?PHQ - 2 Score 0 0  0  ?Altered sleeping 0 - 0  ?Tired, decreased energy 0 - 0  ?Change in appetite 0 - 0  ?Feeling bad or failure about yourself  0 - 0  ?Trouble concentrating 0 - 0  ?Moving slowly or fidgety/restless 0 - 0  ?Suicidal thoughts 0 - 0  ?PHQ-9 Score 0 - 0  ?Difficult doing work/chores Not difficult at all - Not difficult at all  ?  ? ?Social History  ? ?Tobacco Use  ?Smoking Status Former  ?Smokeless Tobacco Never  ? ?BP Readings from Last 3 Encounters:  ?10/04/21 112/68  ?09/05/21 (!) 96/55  ?08/31/21 (!) 142/73  ? ?Pulse Readings from Last 3 Encounters:  ?10/04/21 96  ?09/05/21 (!) 54  ?08/31/21 81  ? ?Wt Readings from Last 3 Encounters:  ?10/04/21 101 lb (45.8 kg)  ?08/31/21 97 lb 3.2 oz (  44.1 kg)  ?08/28/21 97 lb 3.2 oz (44.1 kg)  ? ?BMI Readings from Last 3 Encounters:  ?10/04/21 16.81 kg/m?  ?08/31/21 16.18 kg/m?  ?08/28/21 16.17 kg/m?  ? ? ?Assessment/Interventions: Review of patient past medical history, allergies, medications, health status, including review of consultants reports, laboratory and other test data, was performed as part of comprehensive evaluation and provision of chronic care management services.  ? ?SDOH:  (Social Determinants of Health) assessments and interventions performed: Yes ? ?Financial Resource Strain: Low Risk   ? Difficulty of Paying Living Expenses: Not hard at all  ? ?Food Insecurity: No Food Insecurity  ? Worried About Charity fundraiser in the Last Year: Never true  ? Ran Out of Food in the Last Year: Never true  ? ? ?SDOH Screenings  ? ?Alcohol Screen: Not on file  ?Depression (PHQ2-9): Not on file  ?Financial Resource Strain: Low Risk   ? Difficulty of Paying Living Expenses: Not hard at all  ?Food Insecurity: No Food Insecurity  ? Worried About Charity fundraiser in the Last Year: Never true  ? Ran Out of Food in the Last Year: Never true  ?Housing: Low Risk   ? Last Housing Risk Score: 0  ?Physical Activity: Not on file  ?Social Connections: Socially Isolated  ? Frequency of  Communication with Friends and Family: More than three times a week  ? Frequency of Social Gatherings with Friends and Family: More than three times a week  ? Attends Religious Services: Never  ? Active M

## 2021-12-28 ENCOUNTER — Ambulatory Visit (INDEPENDENT_AMBULATORY_CARE_PROVIDER_SITE_OTHER): Payer: Medicare HMO | Admitting: Pharmacist

## 2021-12-28 ENCOUNTER — Other Ambulatory Visit: Payer: Self-pay

## 2021-12-28 NOTE — Patient Instructions (Addendum)
Visit Information ? ? Goals Addressed   ? ?  ?  ?  ?  ? This Visit's Progress  ?  Improve My Heart Health-Coronary Artery Disease     ?  Timeframe:  Long-Range Goal ?Priority:  High ?Start Date: 12/28/21                            ?Expected End Date: 06/30/22                     ? ?Follow Up Date 03/30/22  ?  ?- learn about small changes that will make a big difference ?- learn my personal risk factors  ?  ?Why is this important?   ?Lifestyle changes are key to improving the blood flow to your heart. Think about the things you can change and set a goal to live healthy.  ?Remember, when the blood vessels to your heart start to get clogged you may not have any symptoms.  ?Over time, they can get worse.  ?Don't ignore the signs, like chest pain, and get help right away.   ?  ?Notes:  ?  ? ?  ? ?Patient Care Plan: General Pharmacy (Adult)  ?  ? ?Problem Identified: CAD, Hypothyroidism, GERD,   ?Priority: High  ?Onset Date: 12/28/2021  ?  ? ?Long-Range Goal: Patient-Specific Goal   ?Start Date: 12/28/2021  ?Expected End Date: 06/30/2022  ?This Visit's Progress: On track  ?Priority: High  ?Note:   ?Current Barriers:  ?Adherence to medication ? ?Pharmacist Clinical Goal(s):  ?Patient will achieve improvement in adherence as evidenced by fill dates through collaboration with PharmD and provider.  ? ?Interventions: ?1:1 collaboration with Eulogio Bear, NP regarding development and update of comprehensive plan of care as evidenced by provider attestation and co-signature ?Inter-disciplinary care team collaboration (see longitudinal plan of care) ?Comprehensive medication review performed; medication list updated in electronic medical record ? ?GERD/Esophageal Ulcer (Goal: Reduce bleeding risk) ?-Controlled ?-Current treatment  ?Sucralfate 1g four times per day Appropriate, Effective, Safe, Accessible ?Lansoprazole '30mg'$  twice daily Appropriate, Effective, Safe, Accessible ?Pantoprazole '40mg'$  daily Query  Appropriate ?Famotidine '40mg'$  twice daily Appropriate, Effective, Safe, Accessible ?-Medications previously tried: pantoprazole  ?-Patient having some swallowing concerns which seem to be improving.  History of esophageal ulcer.  He is currently taking both PPI medications.  Counseled on duplicate therapy.  Recommend he continue forward with just the Lansoprazole, Sucralfate, and Famotidine.  We are going to set him up with Upstream moving forward to aid in adherence. ? ?Hyperlipidemia/CAD: (LDL goal < 70) ?-Uncontrolled ?-Current treatment: ?Simvastatin '80mg'$  daily Appropriate, Query effective, ?-Medications previously tried: pravastatin  ?-Educated on Cholesterol goals;  ?Benefits of statin for ASCVD risk reduction; ?Importance of limiting foods high in cholesterol; ?-Unclear if he has been taking this medication or not.  Fill history does not show any recent fills.  He reports he has it at home but not in his clear bag of medication he brought in today.  LDL very elevated above goal at last visit.  Will set him up with pill packs to that he is taking medication appropriately.  He is switching providers due to [place] leaving.  Recommend he have lipids checked ASAP with new provider and treat appropriately. ? ? ?Hypothryoidism (Goal: Maintain TSH) ?-Uncontrolled ?-Current treatment  ?Levothyroxine 71mg daily Appropriate, Query effective, ?-Medications previously tried: none noted ?-Most recent TSH is uncontrolled - last checked in December.  Adherence is a concern.  Reports he is taking appropriately every day in the morning now.  Will set up with pill packs to help with adherence.  Recommend recheck TSH.  ?-Recommended to continue current medication ?Recheck TSH with new PCP.  Will contact him if we hire someone here. ? ?Patient Goals/Self-Care Activities ?Patient will:  ?- take medications as prescribed as evidenced by patient report and record review ?focus on medication adherence by pill packs ? ?Follow Up  Plan: The care management team will reach out to the patient again over the next 180 days.  ?  ? ? ?Mr. Pauwels was given information about Chronic Care Management services today including:  ?CCM service includes personalized support from designated clinical staff supervised by his physician, including individualized plan of care and coordination with other care providers ?24/7 contact phone numbers for assistance for urgent and routine care needs. ?Standard insurance, coinsurance, copays and deductibles apply for chronic care management only during months in which we provide at least 20 minutes of these services. Most insurances cover these services at 100%, however patients may be responsible for any copay, coinsurance and/or deductible if applicable. This service may help you avoid the need for more expensive face-to-face services. ?Only one practitioner may furnish and bill the service in a calendar month. ?The patient may stop CCM services at any time (effective at the end of the month) by phone call to the office staff. ? ?Patient agreed to services and verbal consent obtained.  ? ?The patient verbalized understanding of instructions, educational materials, and care plan provided today and agreed to receive a mailed copy of patient instructions, educational materials, and care plan.  ?Telephone follow up appointment with pharmacy team member scheduled for: 20 dyas ? ?Edythe Clarity, [facility]  ?Beverly Milch, PharmD, CPP ?Clinical Pharmacist Practitioner ?[place] ?([phone] ? ?

## 2022-01-02 NOTE — Patient Instructions (Signed)
? ? ? ? ? ? [name] ? 01/02/2022  ?  ? '@PREFPERIOPPHARMACY'$ @ ? ? Your procedure is scheduled on  01/09/2022. ? ? Report to [facility] at  1215  P.M. ? ? Call this number if you have problems the morning of surgery: ? [phone] ? ? Remember: ? Follow the diet instructions given to you by the office. ? ?Use your inhaler before you come and bring your rescue inhaler with you. ?  ? Take these medicines the morning of surgery with A SIP OF WATER  ? ?pepcid, hycet(if needed), levothyroxine, prilosec. ? ?  ? Do not wear jewelry, make-up or nail polish. ? Do not wear lotions, powders, or perfumes, or deodorant. ? Do not shave 48 hours prior to surgery.  Men may shave face and neck. ? Do not bring valuables to the hospital. ? [facility] is not responsible for any belongings or valuables. ? ?Contacts, dentures or bridgework may not be worn into surgery.  Leave your suitcase in the car.  After surgery it may be brought to your room. ? ?For patients admitted to the hospital, discharge time will be determined by your treatment team. ? ?Patients discharged the day of surgery will not be allowed to drive home and must have someone with them for 24 hours.  ? ? ?Special instructions:   DO NOT smoke tobacco or vape for 24 hours before your procedure. ? ?Please read over the following fact sheets that you were given. ?Anesthesia Post-op Instructions and Care and Recovery After Surgery ?  ? ? ? Upper Endoscopy, Adult, Care After ?This sheet gives you information about how to care for yourself after your procedure. Your health care provider may also give you more specific instructions. If you have problems or questions, contact your health care provider. ?What can I expect after the procedure? ?After the procedure, it is common to have: ?A sore throat. ?Mild stomach pain or discomfort. ?Bloating. ?Nausea. ?Follow these instructions at home: ? ?Follow instructions from your health care provider about what to eat or drink after  your procedure. ?Return to your normal activities as told by your health care provider. Ask your health care provider what activities are safe for you. ?Take over-the-counter and prescription medicines only as told by your health care provider. ?If you were given a sedative during the procedure, it can affect you for several hours. Do not drive or operate machinery until your health care provider says that it is safe. ?Keep all follow-up visits as told by your health care provider. This is important. ?Contact a health care provider if you have: ?A sore throat that lasts longer than one day. ?Trouble swallowing. ?Get help right away if: ?You vomit blood or your vomit looks like coffee grounds. ?You have: ?A fever. ?Bloody, black, or tarry stools. ?A severe sore throat or you cannot swallow. ?Difficulty breathing. ?Severe pain in your chest or abdomen. ?Summary ?After the procedure, it is common to have a sore throat, mild stomach discomfort, bloating, and nausea. ?If you were given a sedative during the procedure, it can affect you for several hours. Do not drive or operate machinery until your health care provider says that it is safe. ?Follow instructions from your health care provider about what to eat or drink after your procedure. ?Return to your normal activities as told by your health care provider. ?This information is not intended to replace advice given to you by your health care provider. Make sure you discuss any  questions you have with your health care provider. ?Document Revised: 08/07/2019 Document Reviewed: 03/03/2018 ?Elsevier Patient Education ? [place]. ?Monitored Anesthesia Care, Care After ?This sheet gives you information about how to care for yourself after your procedure. Your health care provider may also give you more specific instructions. If you have problems or questions, contact your health care provider. ?What can I expect after the procedure? ?After the procedure, it is  common to have: ?Tiredness. ?Forgetfulness about what happened after the procedure. ?Impaired judgment for important decisions. ?Nausea or vomiting. ?Some difficulty with balance. ?Follow these instructions at home: ?For the time period you were told by your health care provider: ?  ?Rest as needed. ?Do not participate in activities where you could fall or become injured. ?Do not drive or use machinery. ?Do not drink alcohol. ?Do not take sleeping pills or medicines that cause drowsiness. ?Do not make important decisions or sign legal documents. ?Do not take care of children on your own. ?Eating and drinking ?Follow the diet that is recommended by your health care provider. ?Drink enough fluid to keep your urine pale yellow. ?If you vomit: ?Drink water, juice, or soup when you can drink without vomiting. ?Make sure you have little or no nausea before eating solid foods. ?General instructions ?Have a responsible adult stay with you for the time you are told. It is important to have someone help care for you until you are awake and alert. ?Take over-the-counter and prescription medicines only as told by your health care provider. ?If you have sleep apnea, surgery and certain medicines can increase your risk for breathing problems. Follow instructions from your health care provider about wearing your sleep device: ?Anytime you are sleeping, including during daytime naps. ?While taking prescription pain medicines, sleeping medicines, or medicines that make you drowsy. ?Avoid smoking. ?Keep all follow-up visits as told by your health care provider. This is important. ?Contact a health care provider if: ?You keep feeling nauseous or you keep vomiting. ?You feel light-headed. ?You are still sleepy or having trouble with balance after 24 hours. ?You develop a rash. ?You have a fever. ?You have redness or swelling around the IV site. ?Get help right away if: ?You have trouble breathing. ?You have new-onset confusion at  home. ?Summary ?For several hours after your procedure, you may feel tired. You may also be forgetful and have poor judgment. ?Have a responsible adult stay with you for the time you are told. It is important to have someone help care for you until you are awake and alert. ?Rest as told. Do not drive or operate machinery. Do not drink alcohol or take sleeping pills. ?Get help right away if you have trouble breathing, or if you suddenly become confused. ?This information is not intended to replace advice given to you by your health care provider. Make sure you discuss any questions you have with your health care provider. ?Document Revised: 06/16/2020 Document Reviewed: 09/03/2019 ?Elsevier Patient Education ? [place]. ? ?

## 2022-01-04 ENCOUNTER — Encounter (HOSPITAL_COMMUNITY)
Admission: RE | Admit: 2022-01-04 | Discharge: 2022-01-04 | Disposition: A | Discharge status: Home / Self Care | Payer: Medicare HMO | Source: Ambulatory Visit | Attending: Gastroenterology | Admitting: Gastroenterology

## 2022-01-04 ENCOUNTER — Encounter (HOSPITAL_COMMUNITY): Payer: Self-pay

## 2022-01-04 HISTORY — DX: Cerebral infarction, unspecified: I63.9

## 2022-01-04 NOTE — Pre-Procedure Instructions (Signed)
Attempted to do pre-op phone call. Phone rang with no voice mail. ?

## 2022-01-09 ENCOUNTER — Ambulatory Visit (HOSPITAL_COMMUNITY): Payer: Medicare HMO | Admitting: Anesthesiology

## 2022-01-09 ENCOUNTER — Ambulatory Visit (HOSPITAL_BASED_OUTPATIENT_CLINIC_OR_DEPARTMENT_OTHER): Payer: Medicare HMO | Admitting: Anesthesiology

## 2022-01-09 ENCOUNTER — Encounter (HOSPITAL_COMMUNITY)
Admission: RE | Disposition: A | Discharge status: Home / Self Care | Payer: Self-pay | Source: Ambulatory Visit | Attending: Gastroenterology

## 2022-01-09 ENCOUNTER — Ambulatory Visit (HOSPITAL_COMMUNITY)
Admission: RE | Admit: 2022-01-09 | Discharge: 2022-01-09 | Disposition: A | Discharge status: Home / Self Care | Payer: Medicare HMO | Source: Ambulatory Visit | Attending: Gastroenterology | Admitting: Gastroenterology

## 2022-01-09 ENCOUNTER — Encounter (HOSPITAL_COMMUNITY): Payer: Self-pay | Admitting: Gastroenterology

## 2022-01-09 DIAGNOSIS — Z79899 Other long term (current) drug therapy: Secondary | ICD-10-CM | POA: Diagnosis not present

## 2022-01-09 DIAGNOSIS — E039 Hypothyroidism, unspecified: Secondary | ICD-10-CM

## 2022-01-09 DIAGNOSIS — K2270 Barrett's esophagus without dysplasia: Secondary | ICD-10-CM | POA: Diagnosis not present

## 2022-01-09 DIAGNOSIS — K289 Gastrojejunal ulcer, unspecified as acute or chronic, without hemorrhage or perforation: Secondary | ICD-10-CM

## 2022-01-09 DIAGNOSIS — Z791 Long term (current) use of non-steroidal anti-inflammatories (NSAID): Secondary | ICD-10-CM | POA: Diagnosis not present

## 2022-01-09 DIAGNOSIS — K2210 Ulcer of esophagus without bleeding: Secondary | ICD-10-CM

## 2022-01-09 DIAGNOSIS — Z7982 Long term (current) use of aspirin: Secondary | ICD-10-CM | POA: Insufficient documentation

## 2022-01-09 DIAGNOSIS — Z87891 Personal history of nicotine dependence: Secondary | ICD-10-CM | POA: Insufficient documentation

## 2022-01-09 DIAGNOSIS — J45909 Unspecified asthma, uncomplicated: Secondary | ICD-10-CM | POA: Insufficient documentation

## 2022-01-09 DIAGNOSIS — K219 Gastro-esophageal reflux disease without esophagitis: Secondary | ICD-10-CM | POA: Insufficient documentation

## 2022-01-09 HISTORY — PX: ESOPHAGOGASTRODUODENOSCOPY (EGD) WITH PROPOFOL: SHX5813

## 2022-01-09 HISTORY — PX: BIOPSY: SHX5522

## 2022-01-09 SURGERY — ESOPHAGOGASTRODUODENOSCOPY (EGD) WITH PROPOFOL
Anesthesia: General

## 2022-01-09 MED ORDER — LACTATED RINGERS IV SOLN
INTRAVENOUS | Status: DC
  Administered 2022-01-09: 1000 mL via INTRAVENOUS

## 2022-01-09 MED ORDER — PROPOFOL 500 MG/50ML IV EMUL
INTRAVENOUS | Status: DC | PRN
Start: 2022-01-09 — End: 2022-01-09
  Administered 2022-01-09: 150 ug/kg/min via INTRAVENOUS

## 2022-01-09 NOTE — Discharge Instructions (Addendum)
You are being discharged to home.  ?Resume your previous diet.  ?We are waiting for your pathology results.  ?Continue your present medications.  ?Your physician has recommended a repeat upper endoscopy in six months for surveillance.  ?Schedule CT chest with Iv contrast ?

## 2022-01-09 NOTE — Transfer of Care (Signed)
Immediate Anesthesia Transfer of Care Note ? ?Patient: Lee Wilson ? ?Procedure(s) Performed: ESOPHAGOGASTRODUODENOSCOPY (EGD) WITH PROPOFOL ?BIOPSY ? ?Patient Location: PACU ? ?Anesthesia Type:General ? ?Level of Consciousness: awake, alert , oriented and patient cooperative ? ?Airway & Oxygen Therapy: Patient Spontanous Breathing ? ?Post-op Assessment: Report given to RN, Post -op Vital signs reviewed and stable and Patient moving all extremities X 4 ? ?Post vital signs: Reviewed and stable ? ?Last Vitals:  ?Vitals Value Taken Time  ?BP 80/43 01/09/22 1436  ?Temp 36.4 ?C 01/09/22 1436  ?Pulse 63 01/09/22 1436  ?Resp 17 01/09/22 1436  ?SpO2 99 % 01/09/22 1436  ? ? ?Last Pain:  ?Vitals:  ? 01/09/22 1436  ?TempSrc: Axillary  ?PainSc: 0-No pain  ?   ? ?Patients Stated Pain Goal: 7 (01/09/22 1302) ? ?Complications: No notable events documented. ?

## 2022-01-09 NOTE — Anesthesia Preprocedure Evaluation (Signed)
Anesthesia Evaluation  ?Patient identified by MRN, date of birth, ID band ?Patient awake ? ? ? ?Reviewed: ?Allergy & Precautions, H&P , NPO status , Patient's Chart, lab work & pertinent test results, reviewed documented beta blocker date and time  ? ?Airway ?Mallampati: II ? ?TM Distance: >3 FB ?Neck ROM: full ? ? ? Dental ?no notable dental hx. ? ?  ?Pulmonary ?asthma , former smoker,  ?  ?Pulmonary exam normal ?breath sounds clear to auscultation ? ? ? ? ? ? Cardiovascular ?Exercise Tolerance: Good ?negative cardio ROS ? ? ?Rhythm:regular Rate:Normal ? ? ?  ?Neuro/Psych ?CVA negative psych ROS  ? GI/Hepatic ?Neg liver ROS, PUD, GERD  Medicated,  ?Endo/Other  ?Hypothyroidism  ? Renal/GU ?negative Renal ROS  ?negative genitourinary ?  ?Musculoskeletal ? ? Abdominal ?  ?Peds ? Hematology ? ?(+) Blood dyscrasia, anemia ,   ?Anesthesia Other Findings ? ? Reproductive/Obstetrics ?negative OB ROS ? ?  ? ? ? ? ? ? ? ? ? ? ? ? ? ?  ?  ? ? ? ? ? ? ? ? ?Anesthesia Physical ?Anesthesia Plan ? ?ASA: 3 ? ?Anesthesia Plan: General  ? ?Post-op Pain Management:   ? ?Induction:  ? ?PONV Risk Score and Plan: Propofol infusion ? ?Airway Management Planned:  ? ?Additional Equipment:  ? ?Intra-op Plan:  ? ?Post-operative Plan:  ? ?Informed Consent: I have reviewed the patients History and Physical, chart, labs and discussed the procedure including the risks, benefits and alternatives for the proposed anesthesia with the patient or authorized representative who has indicated his/her understanding and acceptance.  ? ? ? ?Dental Advisory Given ? ?Plan Discussed with: CRNA ? ?Anesthesia Plan Comments:   ? ? ? ? ? ? ?Anesthesia Quick Evaluation ? ?

## 2022-01-09 NOTE — H&P (Signed)
Lee Wilson is an 76 y.o. male.   ?Chief Complaint: esophageal ulcer ?HPI:  76 y.o. male with a complex medical history for large esophageal ulcer which was previously concerning for malignancy, asthma, GERD, who comes for follow-up of esophageal ulcer. ? ? ?Patient has been asymptomatic and has been compliant with medications. The patient denies having any heartburn, dysphagia, nausea, vomiting, fever, chills, hematochezia, melena, hematemesis, abdominal distention, abdominal pain, diarrhea, jaundice, pruritus or weight loss. ? ? ?Past Medical History:  ?Diagnosis Date  ? Anemia   ? Asthma   ? Cancer [facility])   ? Fall from height of greater than 3 feet 07/16/2016  ? GERD (gastroesophageal reflux disease)   ? Leg cramp 01/04/2021  ? Stroke [facility])   ? ? ?Past Surgical History:  ?Procedure Laterality Date  ? BIOPSY  06/28/2021  ? Procedure: BIOPSY;  Surgeon: Irving Copas., MD;  Location: [facility];  Service: Gastroenterology;;  ? BIOPSY  09/05/2021  ? Procedure: BIOPSY;  Surgeon: Montez Morita, Quillian Quince, MD;  Location: [facility];  Service: Gastroenterology;;  ? ESOPHAGOGASTRODUODENOSCOPY (EGD) WITH PROPOFOL N/A 06/28/2021  ? Procedure: ESOPHAGOGASTRODUODENOSCOPY (EGD) WITH PROPOFOL;  Surgeon: Rush Landmark Telford Nab., MD;  Location: [facility];  Service: Gastroenterology;  Laterality: N/A;  ? ESOPHAGOGASTRODUODENOSCOPY (EGD) WITH PROPOFOL N/A 09/05/2021  ? Procedure: ESOPHAGOGASTRODUODENOSCOPY (EGD) WITH PROPOFOL;  Surgeon: Harvel Quale, MD;  Location: [facility];  Service: Gastroenterology;  Laterality: N/A;  1:30  ? FINE NEEDLE ASPIRATION  06/28/2021  ? Procedure: FINE NEEDLE ASPIRATION (FNA) LINEAR;  Surgeon: Rush Landmark Telford Nab., MD;  Location: [facility] ENDOS[facility];  Service: Gastroenterology;;  ? FRACTURE SURGERY    ? UPPER ESOPHAGEAL ENDOSCOPIC ULTRASOUND (EUS) N/A 06/28/2021  ? Procedure: UPPER ESOPHAGEAL ENDOSCOPIC ULTRASOUND (EUS);  Surgeon: Irving Copas., MD;  Location:  [facility];  Service: Gastroenterology;  Laterality: N/A;  ? ? ?Family History  ?Problem Relation Age of Onset  ? Leukemia Mother   ? Heart attack Father   ? ?Social History:  reports that he has quit smoking. He has never used smokeless tobacco. He reports current alcohol use. He reports that he does not use drugs. ? ?Allergies:  ?Allergies  ?Allergen Reactions  ? Penicillin G Other (See Comments)  ?  Unknown childhood reaction  ? ? ?Medications Prior to Admission  ?Medication Sig Dispense Refill  ? albuterol (VENTOLIN HFA) 108 (90 Base) MCG/ACT inhaler Inhale 2 puffs into the lungs every 4 (four) hours as needed for wheezing or shortness of breath. 18 g 2  ? aspirin EC 81 MG tablet Take 81 mg by mouth daily. Swallow whole.    ? diclofenac Sodium (VOLTAREN) 1 % GEL Apply to back three times as needed for pain 100 g 2  ? Ensure (ENSURE) Take 237 mLs by mouth See admin instructions. Strawberry flavor - drink one to two cans by mouth daily    ? famotidine (PEPCID) 40 MG tablet TAKE 1 TABLET(40 MG) BY MOUTH TWICE DAILY 120 tablet 0  ? HYDROcodone-acetaminophen (HYCET) 7.5-325 mg/15 ml solution Take 10 mLs by mouth every 6 (six) hours as needed for moderate pain. 240 mL 0  ? hydrocortisone 2.5 % cream Apply topically 2 (two) times daily. Apply to face (Patient taking differently: Apply 1 application. topically 2 (two) times daily as needed (facial dry skin/itching).) 30 g 2  ? lansoprazole (PREVACID) 30 MG capsule Take 1 capsule (30 mg total) by mouth 2 (two) times daily before a meal. 60 capsule 6  ? levothyroxine (SYNTHROID) 88  MCG tablet Take 1 tablet (88 mcg total) by mouth daily before breakfast. 90 tablet 0  ? Multiple Vitamin (MULTIVITAMIN WITH MINERALS) TABS tablet Take 1 tablet by mouth daily.    ? pantoprazole (PROTONIX) 40 MG tablet Take 40 mg by mouth 2 (two) times daily.    ? pravastatin (PRAVACHOL) 40 MG tablet Take 40 mg by mouth daily.    ? protein supplement (RESOURCE BENEPROTEIN) 6 g POWD Take 1  Scoop (6 g total) by mouth 3 (three) times daily with meals.    ? simvastatin (ZOCOR) 80 MG tablet Take 1 tablet (80 mg total) by mouth at bedtime. 90 tablet 3  ? sucralfate (CARAFATE) 1 g tablet Take 1 tablet (1 g total) by mouth 4 (four) times daily -  with meals and at bedtime. 360 tablet 0  ? vitamin B-12 (CYANOCOBALAMIN) 1000 MCG tablet Take 1,000 mcg by mouth daily.    ? ? ?No results found for this or any previous visit (from the past 48 hour(s)). ?No results found. ? ?Review of Systems  ?Constitutional: Negative.   ?HENT: Negative.    ?Eyes: Negative.   ?Respiratory: Negative.    ?Cardiovascular: Negative.   ?Gastrointestinal: Negative.   ?Endocrine: Negative.   ?Genitourinary: Negative.   ?Musculoskeletal: Negative.   ?Skin: Negative.   ?Allergic/Immunologic: Negative.   ?Neurological: Negative.   ?Hematological: Negative.   ?Psychiatric/Behavioral: Negative.    ? ?Blood pressure 134/69, pulse 64, temperature 97.6 ?F (36.4 ?C), temperature source Oral, resp. rate 16, SpO2 100 %. ?Physical Exam  ?GENERAL: The patient is AO x3, in no acute distress. ?HEENT: Head is normocephalic and atraumatic. EOMI are intact. Mouth is well hydrated and without lesions. ?NECK: Supple. No masses ?LUNGS: Clear to auscultation. No presence of rhonchi/wheezing/rales. Adequate chest expansion ?HEART: RRR, normal s1 and s2. ?ABDOMEN: Soft, nontender, no guarding, no peritoneal signs, and nondistended. BS +. No masses. ?EXTREMITIES: Without any cyanosis, clubbing, rash, lesions or edema. ?NEUROLOGIC: AOx3, no focal motor deficit. ?SKIN: no jaundice, no rashes ? ?Assessment/Plan ?76 y.o. male with a complex medical history for large esophageal ulcer which was previously concerning for malignancy, asthma, GERD, who comes for follow-up of esophageal ulcer.  We will proceed with EGD. ? ?Harvel Quale, MD ?01/09/2022, 2:15 PM ? ? ? ?

## 2022-01-09 NOTE — Op Note (Signed)
[facility] ?Patient Name: Lee Wilson ?Procedure Date: 01/09/2022 2:02 PM ?MRN: [id number] ?Date of Birth: [birth year] ?Attending MD: [name] ,  ?CSN: [id number] ?Age: 76 ?Admit Type: Outpatient ?Procedure:                Upper GI endoscopy ?Indications:              Follow-up of ulcer of the GI tract ?Providers:                [name], Crystal Page, Randa Spike,  ?                          Technician ?Referring MD:              ?Medicines:                Monitored Anesthesia Care ?Complications:            No immediate complications. ?Estimated Blood Loss:     Estimated blood loss: none. ?Procedure:                Pre-Anesthesia Assessment: ?                          - Prior to the procedure, a History and Physical  ?                          was performed, and patient medications, allergies  ?                          and sensitivities were reviewed. The patient's  ?                          tolerance of previous anesthesia was reviewed. ?                          - The risks and benefits of the procedure and the  ?                          sedation options and risks were discussed with the  ?                          patient. All questions were answered and informed  ?                          consent was obtained. ?                          - ASA Grade Assessment: III - A patient with severe  ?                          systemic disease. ?                          After obtaining informed consent, the endoscope was  ?                          passed under direct vision. Throughout the  ?  procedure, the patient's blood pressure, pulse, and  ?                          oxygen saturations were monitored continuously. The  ?                          GIF-H190 ([id number]) scope was introduced through the  ?                          mouth, and advanced to the second part of duodenum.  ?                          The upper GI endoscopy was accomplished without  ?                           difficulty. The patient tolerated the procedure  ?                          well. ?Scope In: 2:21:58 PM ?Scope Out: 2:29:15 PM ?Total Procedure Duration: 0 hours 7 minutes 17 seconds  ?Findings: ?     The esophagus and gastroesophageal junction were examined with white  ?     light and narrow band imaging (NBI). There were esophageal mucosal  ?     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 These  ?     changes involved the mucosa extending to the Z-line (19 cm from the  ?     incisors). Circumferential salmon-colored mucosa was present from 19 to  ?     32 cm. The maximum longitudinal extent of these esophageal mucosal  ?     changes was 13 cm in length. ?     One superficial esophageal ulcer with no stigmata of recent bleeding was  ?     found 30 to 32 cm from the incisors. The lesion was 10 mm in largest  ?     dimension. Biopsies were taken with a cold forceps for histology. ?     The entire examined stomach was normal. ?     The examined duodenum was normal. ?Impression:               - Esophageal mucosal changes classified as  ?                          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 ?                          - Esophageal ulcer with no stigmata of recent  ?                          bleeding. Biopsied. ?                          - Normal stomach. ?                          - Normal examined duodenum. ?Moderate Sedation: ?     Per Anesthesia Care ?Recommendation:           -  Discharge patient to home (ambulatory). ?                          - Resume previous diet. ?                          - Await pathology results. ?                          - Continue present medications. ?                          - Repeat upper endoscopy in 6 months for  ?                          surveillance. ?                          - Schedule CT chest with Iv contrast ?Procedure Code(s):        --- Professional --- ?                          [phone], Esophagogastroduodenoscopy,  flexible,  ?                          transoral; with biopsy, single or multiple ?Diagnosis Code(s):        --- Professional --- ?                          K22.70, Barrett's esophagus without dysplasia ?                          K22.10, Ulcer of esophagus without bleeding ?                          K28.9, Gastrojejunal ulcer, unspecified as acute or  ?                          chronic, without hemorrhage or perforation ?CPT copyright 2019 American Medical Association. All rights reserved. ?The codes documented in this report are preliminary and upon coder review may  ?be revised to meet current compliance requirements. ?[name], MD ?[name],  ?01/09/2022 2:40:10 PM ?This report has been signed electronically. ?Number of Addenda: 0 ?

## 2022-01-10 NOTE — Anesthesia Postprocedure Evaluation (Signed)
Anesthesia Post Note ? ?Patient: Lee Wilson ? ?Procedure(s) Performed: ESOPHAGOGASTRODUODENOSCOPY (EGD) WITH PROPOFOL ?BIOPSY ? ?Patient location during evaluation: Phase II ?Anesthesia Type: General ?Level of consciousness: awake ?Pain management: pain level controlled ?Vital Signs Assessment: post-procedure vital signs reviewed and stable ?Respiratory status: spontaneous breathing and respiratory function stable ?Cardiovascular status: blood pressure returned to baseline and stable ?Postop Assessment: no headache and no apparent nausea or vomiting ?Anesthetic complications: no ?Comments: Late entry ? ? ?No notable events documented. ? ? ?Last Vitals:  ?Vitals:  ? 01/09/22 1436 01/09/22 1442  ?BP: (!) 80/43 100/64  ?Pulse: 63   ?Resp: 17   ?Temp: (!) 36.4 ?C   ?SpO2: 99% 100%  ?  ?Last Pain:  ?Vitals:  ? 01/09/22 1442  ?TempSrc:   ?PainSc: 0-No pain  ? ? ?  ?  ?  ?  ?  ?  ? ?Louann Sjogren ? ? ? ? ?

## 2022-01-11 LAB — SURGICAL PATHOLOGY

## 2022-01-12 ENCOUNTER — Encounter (HOSPITAL_COMMUNITY): Payer: Self-pay | Admitting: Gastroenterology

## 2022-01-12 DIAGNOSIS — E039 Hypothyroidism, unspecified: Secondary | ICD-10-CM

## 2022-01-12 NOTE — Addendum Note (Signed)
Addendum  created 01/12/22 0839 by Jonna Munro, CRNA  ? Intraprocedure Staff edited  ?  ?

## 2022-01-15 ENCOUNTER — Encounter (INDEPENDENT_AMBULATORY_CARE_PROVIDER_SITE_OTHER): Payer: Self-pay | Admitting: *Deleted

## 2022-01-15 NOTE — Progress Notes (Signed)
Result letter mailed to patient

## 2022-01-23 ENCOUNTER — Encounter (INDEPENDENT_AMBULATORY_CARE_PROVIDER_SITE_OTHER): Payer: Self-pay

## 2022-01-23 ENCOUNTER — Telehealth (INDEPENDENT_AMBULATORY_CARE_PROVIDER_SITE_OTHER): Payer: Self-pay

## 2022-01-23 NOTE — Telephone Encounter (Signed)
I have tried calling numerous times to get Lee Wilson scheduled for a Ct Chest and he will not answer the phone, he has no voicemail, I even spoke to his emergency contact Gaylyn Rong and asked her to have him return my call and he has not at this time returned my call I have mailed him a letter asking to contact our office  ?

## 2022-01-23 NOTE — Telephone Encounter (Signed)
Thanks Darius Bump for trying so hard ?

## 2022-01-31 ENCOUNTER — Ambulatory Visit: Payer: Medicare Other | Admitting: Nurse Practitioner

## 2022-03-05 ENCOUNTER — Ambulatory Visit: Payer: Medicare HMO | Admitting: Family Medicine

## 2022-03-06 ENCOUNTER — Other Ambulatory Visit: Payer: Self-pay | Admitting: Family Medicine

## 2022-03-06 ENCOUNTER — Telehealth (INDEPENDENT_AMBULATORY_CARE_PROVIDER_SITE_OTHER): Payer: Self-pay

## 2022-03-06 ENCOUNTER — Other Ambulatory Visit (INDEPENDENT_AMBULATORY_CARE_PROVIDER_SITE_OTHER): Payer: Self-pay | Admitting: Gastroenterology

## 2022-03-06 DIAGNOSIS — E039 Hypothyroidism, unspecified: Secondary | ICD-10-CM

## 2022-03-06 MED ORDER — FAMOTIDINE 40 MG PO TABS: 40.0000 mg | ORAL_TABLET | Freq: Two times a day (BID) | ORAL | 1 refills | Status: DC

## 2022-03-06 NOTE — Telephone Encounter (Signed)
Will continue famotidine Bid for now and repeat EGD in 6 months from last EGD

## 2022-03-06 NOTE — Telephone Encounter (Signed)
-----   Message from Edythe Clarity, [facility] sent at 03/06/2022 12:18 PM EDT ----- Patient has not been taking any of his meds. We are going to help him with [facility]. Can we please have new 90 day rx for levothyroxine, simvastatin, lansoprazole, and Carafate sent to [facility] for delivery?  Thanks!  Beverly Milch, PharmD, CPP Clinical Pharmacist Practitioner [place] [phone]

## 2022-03-06 NOTE — Telephone Encounter (Signed)
Lee Wilson,please place a repeat EGD around 07/12/2022 as last one was done 01/09/2022. Thank you!

## 2022-03-06 NOTE — Telephone Encounter (Signed)
EGD noted in recall

## 2022-03-06 NOTE — Telephone Encounter (Signed)
April with [facility] is calling for refills on Famotidine 40 mg bid. Your last note on 12/14/2021 was start famotidine bid for three months. Please advise as of first of June three months will be up.

## 2022-03-07 NOTE — Telephone Encounter (Signed)
Requested medication (s) are due for refill today: yes  Requested medication (s) are on the active medication list: yes    Last refill: Synthroid 02/16/21 #90  ) refills  Carafate 10/06/21 #360  0 refill    Prevacid 09/16/21 #60 6 refills     Zocor 02/24/21 #90 3 refills  Future visit scheduled Yes 03/09/22  Notes to clinic:Please review:      "Patient has not been taking any of his meds. We are going to help him with [facility]. Can we please have new 90 day rx for levothyroxine, simvastatin, lansoprazole, and Carafate sent to [facility] for delivery?"   Thanks!   Beverly Milch, PharmD, CPP Clinical Pharmacist Practitioner [facility] [phone]    Requested Prescriptions  Pending Prescriptions Disp Refills   levothyroxine (SYNTHROID) 88 MCG tablet 90 tablet 0    Sig: Take 1 tablet (88 mcg total) by mouth daily before breakfast.     Endocrinology:  Hypothyroid Agents Failed - 03/06/2022  1:13 PM      Failed - TSH in normal range and within 360 days    TSH  Date Value Ref Range Status  10/04/2021 10.99 (H) 0.40 - 4.50 mIU/L Final         Passed - Valid encounter within last 12 months    Recent Outpatient Visits           5 months ago Simple chronic bronchitis ([place])   [place] Eulogio Bear, NP   9 months ago Simple chronic bronchitis ([place])   [place] Eulogio Bear, NP   9 months ago Chest pain, unspecified type   [place] Eulogio Bear, NP   10 months ago Gastroesophageal reflux disease with esophagitis without hemorrhage   [place] Eulogio Bear, NP   1 year ago Acquired hypothyroidism   [place] Eulogio Bear, NP       Future Appointments             In 2 days Susy Frizzle, MD [place] [facility]              sucralfate (CARAFATE) 1 g tablet 360 tablet 0    Sig: Take 1 tablet (1 g total) by  mouth 4 (four) times daily -  with meals and at bedtime.     Gastroenterology: Antiacids Passed - 03/06/2022  1:13 PM      Passed - Valid encounter within last 12 months    Recent Outpatient Visits           5 months ago Simple chronic bronchitis ([place])   [facility] Eulogio Bear, NP   9 months ago Simple chronic bronchitis ([place])   [facility] Eulogio Bear, NP   9 months ago Chest pain, unspecified type   [place] Eulogio Bear, NP   10 months ago Gastroesophageal reflux disease with esophagitis without hemorrhage   [place] Eulogio Bear, NP   1 year ago Acquired hypothyroidism   [place] Eulogio Bear, NP       Future Appointments             In 2 days Susy Frizzle, MD [facility] [place] [facility]              lansoprazole (PREVACID) 30 MG capsule 60 capsule 6  Sig: Take 1 capsule (30 mg total) by mouth 2 (two) times daily before a meal.     Gastroenterology: Proton Pump Inhibitors 2 Passed - 03/06/2022  1:13 PM      Passed - ALT in normal range and within 360 days    ALT  Date Value Ref Range Status  06/26/2021 9 0 - 44 U/L Final  06/07/2021 9 0 - 44 U/L Final         Passed - AST in normal range and within 360 days    AST  Date Value Ref Range Status  06/26/2021 18 15 - 41 U/L Final  06/07/2021 16 15 - 41 U/L Final         Passed - Valid encounter within last 12 months    Recent Outpatient Visits           5 months ago Simple chronic bronchitis ([place])   [place] Eulogio Bear, NP   9 months ago Simple chronic bronchitis ([place])   [place] Eulogio Bear, NP   9 months ago Chest pain, unspecified type   [place] Eulogio Bear, NP   10 months ago Gastroesophageal reflux disease with esophagitis without hemorrhage   [place]  Eulogio Bear, NP   1 year ago Acquired hypothyroidism   [place] Eulogio Bear, NP       Future Appointments             In 2 days Susy Frizzle, MD [place] [facility]              simvastatin (ZOCOR) 80 MG tablet 90 tablet 3    Sig: Take 1 tablet (80 mg total) by mouth at bedtime.     Cardiovascular:  Antilipid - Statins Failed - 03/06/2022  1:13 PM      Failed - Lipid Panel in normal range within the last 12 months    Cholesterol  Date Value Ref Range Status  06/09/2021 212 (H) <200 mg/dL Final   LDL Cholesterol (Calc)  Date Value Ref Range Status  06/09/2021 136 (H) mg/dL (calc) Final    Comment:    Reference range: <100 . Desirable range <100 mg/dL for primary prevention;   <70 mg/dL for patients with CHD or diabetic patients  with > or = 2 CHD risk factors. Marland Kitchen LDL-C is now calculated using the Martin-Hopkins  calculation, which is a validated novel method providing  better accuracy than the Friedewald equation in the  estimation of LDL-C.  Cresenciano Genre et al. Annamaria Helling. 1829;937(16): 2061-2068  (http://education.[employer].com/faq/FAQ164)    HDL  Date Value Ref Range Status  06/09/2021 45 > OR = 40 mg/dL Final   Triglycerides  Date Value Ref Range Status  06/09/2021 168 (H) <150 mg/dL Final         Passed - Patient is not pregnant      Passed - Valid encounter within last 12 months    Recent Outpatient Visits           5 months ago Simple chronic bronchitis ([place])   [place] Eulogio Bear, NP   9 months ago Simple chronic bronchitis ([place])   [facility] Eulogio Bear, NP   9 months ago Chest pain, unspecified type   [place], Jessica A, NP   10 months ago Gastroesophageal reflux disease with esophagitis without hemorrhage  [place] Eulogio Bear, NP   1 year ago Acquired hypothyroidism   [place] Eulogio Bear, NP       Future Appointments             In 2 days Pickard, Cammie Mcgee, MD [place]

## 2022-03-08 ENCOUNTER — Other Ambulatory Visit: Payer: Self-pay | Admitting: *Deleted

## 2022-03-08 DIAGNOSIS — E039 Hypothyroidism, unspecified: Secondary | ICD-10-CM

## 2022-03-08 MED ORDER — SIMVASTATIN 80 MG PO TABS: 80.0000 mg | ORAL_TABLET | Freq: Every day | ORAL | 3 refills | Status: DC

## 2022-03-08 MED ORDER — SUCRALFATE 1 G PO TABS: 1.0000 g | ORAL_TABLET | Freq: Three times a day (TID) | ORAL | 0 refills | Status: DC

## 2022-03-08 MED ORDER — LANSOPRAZOLE 30 MG PO CPDR: 30.0000 mg | DELAYED_RELEASE_CAPSULE | Freq: Two times a day (BID) | ORAL | 6 refills | Status: DC

## 2022-03-08 MED ORDER — LEVOTHYROXINE SODIUM 88 MCG PO TABS: 88.0000 ug | ORAL_TABLET | Freq: Every day | ORAL | 0 refills | Status: DC

## 2022-03-08 NOTE — Telephone Encounter (Signed)
-----   Message from Edythe Clarity, [facility] sent at 03/08/2022  1:02 PM EDT ----- Hello!  Just wanted to follow up on his request for these refills to be sent.  Thanks! ----- Message ----- From: Edythe Clarity, [facility] Sent: 03/06/2022  12:19 PM EDT To: [facility] Rx Refill Pool  Patient has not been taking any of his meds. We are going to help him with [facility]. Can we please have new 90 day rx for levothyroxine, simvastatin, lansoprazole, and Carafate sent to [facility] for delivery?  Thanks!  Beverly Milch, PharmD, CPP Clinical Pharmacist Practitioner [place] [phone]

## 2022-03-08 NOTE — Telephone Encounter (Signed)
Rx filled by provider today-duplicate request Requested Prescriptions  Pending Prescriptions Disp Refills  . levothyroxine (SYNTHROID) 88 MCG tablet 90 tablet 0    Sig: Take 1 tablet (88 mcg total) by mouth daily before breakfast.     Endocrinology:  Hypothyroid Agents Failed - 03/08/2022  1:42 PM      Failed - TSH in normal range and within 360 days    TSH  Date Value Ref Range Status  10/04/2021 10.99 (H) 0.40 - 4.50 mIU/L Final         Passed - Valid encounter within last 12 months    Recent Outpatient Visits          5 months ago Simple chronic bronchitis ([place])   [place] Eulogio Bear, NP   9 months ago Simple chronic bronchitis ([place])   [place] Eulogio Bear, NP   9 months ago Chest pain, unspecified type   [place] Eulogio Bear, NP   10 months ago Gastroesophageal reflux disease with esophagitis without hemorrhage   [place] Eulogio Bear, NP   1 year ago Acquired hypothyroidism   [place] Eulogio Bear, NP      Future Appointments            Tomorrow Pickard, Cammie Mcgee, MD [place] [facility]           . lansoprazole (PREVACID) 30 MG capsule 60 capsule 6    Sig: Take 1 capsule (30 mg total) by mouth 2 (two) times daily before a meal.     Gastroenterology: Proton Pump Inhibitors 2 Passed - 03/08/2022  1:42 PM      Passed - ALT in normal range and within 360 days    ALT  Date Value Ref Range Status  06/26/2021 9 0 - 44 U/L Final  06/07/2021 9 0 - 44 U/L Final         Passed - AST in normal range and within 360 days    AST  Date Value Ref Range Status  06/26/2021 18 15 - 41 U/L Final  06/07/2021 16 15 - 41 U/L Final         Passed - Valid encounter within last 12 months    Recent Outpatient Visits          5 months ago Simple chronic bronchitis ([place])   [place] Eulogio Bear, NP   9  months ago Simple chronic bronchitis ([place])   [place] Eulogio Bear, NP   9 months ago Chest pain, unspecified type   [place] Noemi [place] A, NP   10 months ago Gastroesophageal reflux disease with esophagitis without hemorrhage   [place] Eulogio Bear, NP   1 year ago Acquired hypothyroidism   [place] Eulogio Bear, NP      Future Appointments            Tomorrow Pickard, Cammie Mcgee, MD [facility]           . sucralfate (CARAFATE) 1 g tablet 360 tablet 0    Sig: Take 1 tablet (1 g total) by mouth 4 (four) times daily -  with meals and at bedtime.     Gastroenterology: Antiacids Passed - 03/08/2022  1:42 PM      Passed - Valid encounter within last 12 months  Recent Outpatient Visits          5 months ago Simple chronic bronchitis ([employer])   [place] Eulogio Bear, NP   9 months ago Simple chronic bronchitis ([place])   [facility] Eulogio Bear, NP   9 months ago Chest pain, unspecified type   [place] Eulogio Bear, NP   10 months ago Gastroesophageal reflux disease with esophagitis without hemorrhage   [place] Eulogio Bear, NP   1 year ago Acquired hypothyroidism   [place] Eulogio Bear, NP      Future Appointments            Tomorrow Pickard, Cammie Mcgee, MD [place]

## 2022-03-08 NOTE — Telephone Encounter (Signed)
See [facility] notes

## 2022-03-09 ENCOUNTER — Encounter: Payer: Self-pay | Admitting: Family Medicine

## 2022-03-09 ENCOUNTER — Other Ambulatory Visit: Payer: Self-pay | Admitting: Family Medicine

## 2022-03-09 ENCOUNTER — Ambulatory Visit (INDEPENDENT_AMBULATORY_CARE_PROVIDER_SITE_OTHER): Payer: Medicare HMO | Admitting: Family Medicine

## 2022-03-09 VITALS — BP 116/70 | Temp 97.9°F | Ht 64.0 in | Wt 97.4 lb

## 2022-03-09 DIAGNOSIS — K2100 Gastro-esophageal reflux disease with esophagitis, without bleeding: Secondary | ICD-10-CM

## 2022-03-09 DIAGNOSIS — K2210 Ulcer of esophagus without bleeding: Secondary | ICD-10-CM

## 2022-03-09 DIAGNOSIS — I6521 Occlusion and stenosis of right carotid artery: Secondary | ICD-10-CM | POA: Diagnosis not present

## 2022-03-09 DIAGNOSIS — J449 Chronic obstructive pulmonary disease, unspecified: Secondary | ICD-10-CM | POA: Diagnosis not present

## 2022-03-09 DIAGNOSIS — E039 Hypothyroidism, unspecified: Secondary | ICD-10-CM

## 2022-03-09 NOTE — Progress Notes (Signed)
Subjective:    Patient ID: Lee Wilson, male    DOB: 1946-07-08, 76 y.o.   MRN: [id number]  HPI Patient is a very frail-appearing 76 year old Caucasian gentleman who is underweight with a BMI of 16.  He does not smoke.  He states that he quit smoking in the remote past.  He states he has had asthma his entire life however at the present time he is only using albuterol as needed.  He has a history of hypothyroidism for which she is on levothyroxine.  He is due to recheck a TSH.  He has a history of a stroke along with a right-sided internal carotid artery stenosis.  The most recent carotid Doppler was 2 years ago which showed a 50 to 69% stenosis of the internal right carotid.  He is overdue to recheck a carotid Doppler.  He is confused as to whether he is taking pravastatin or simvastatin.  His medication list includes both.  He denies any myalgias or right upper quadrant pain.  He is taking an aspirin.  He is due to recheck a carotid Doppler as well as a fasting lipid panel I like to see his LDL cholesterol was 70.  He is following up regularly with GI and his most recent EGD showed no cancer cells.  Previous EGD in November of last year did reveal Barrett's esophagus.  He is due to recheck that in 6 months and GI is following up regularly.  His immunizations are up-to-date including Pneumovax 23 and Prevnar 13.  GI plans to schedule colonoscopy in the fall.  He does not require lung cancer screening as he is a non-smoker and is not symptomatic.  He does drink alcohol and I strongly encouraged abstinence from alcohol due to his history of esophageal ulcer Past Medical History:  Diagnosis Date   Anemia    Cancer ([place])    Carotid stenosis, asymptomatic, right    COPD (chronic obstructive pulmonary disease) ([place])    Esophageal ulcer    Barrett's esophagus   Fall from height of greater than 3 feet 07/16/2016   GERD (gastroesophageal reflux disease)    Hyperlipidemia    Hypothyroidism    Leg  cramp 01/04/2021   Stroke [facility])    Thyroid disease    Past Surgical History:  Procedure Laterality Date   BIOPSY  06/28/2021   Procedure: BIOPSY;  Surgeon: Irving Copas., MD;  [facility];  Service: Gastroenterology;;   BIOPSY  09/05/2021   Procedure: BIOPSY;  Surgeon: Harvel Quale, MD;  [facility];  Service: Gastroenterology;;   BIOPSY  01/09/2022   Procedure: BIOPSY;  Surgeon: Harvel Quale, MD;  [facility];  Service: Gastroenterology;;   ESOPHAGOGASTRODUODENOSCOPY (EGD) WITH PROPOFOL N/A 06/28/2021   Procedure: ESOPHAGOGASTRODUODENOSCOPY (EGD) WITH PROPOFOL;  Surgeon: Irving Copas., MD;  [facility];  Service: Gastroenterology;  Laterality: N/A;   ESOPHAGOGASTRODUODENOSCOPY (EGD) WITH PROPOFOL N/A 09/05/2021   Procedure: ESOPHAGOGASTRODUODENOSCOPY (EGD) WITH PROPOFOL;  Surgeon: Harvel Quale, MD;  [facility];  Service: Gastroenterology;  Laterality: N/A;  1:30   ESOPHAGOGASTRODUODENOSCOPY (EGD) WITH PROPOFOL N/A 01/09/2022   Procedure: ESOPHAGOGASTRODUODENOSCOPY (EGD) WITH PROPOFOL;  Surgeon: Harvel Quale, MD;  [facility];  Service: Gastroenterology;  Laterality: N/A;  2:00   FINE NEEDLE ASPIRATION  06/28/2021   Procedure: FINE NEEDLE ASPIRATION (FNA) LINEAR;  Surgeon: Irving Copas., MD;  [facility];  Service: Gastroenterology;;   FRACTURE SURGERY  UPPER ESOPHAGEAL ENDOSCOPIC ULTRASOUND (EUS) N/A 06/28/2021   Procedure: UPPER ESOPHAGEAL ENDOSCOPIC ULTRASOUND (EUS);  Surgeon: Irving Copas., MD;  Location: [facility];  Service: Gastroenterology;  Laterality: N/A;   Allergies  Allergen Reactions   Penicillin G Other (See Comments)    Unknown childhood reaction   Social History   Socioeconomic History   Marital status: Widowed    Spouse name: Not on file   Number of children: Not on file   Years of education:  Not on file   Highest education level: Not on file  Occupational History   Not on file  Tobacco Use   Smoking status: Former   Smokeless tobacco: Never  Vaping Use   Vaping Use: Never used  Substance and Sexual Activity   Alcohol use: Yes    Comment: occasionally   Drug use: No   Sexual activity: Not on file  Other Topics Concern   Not on file  Social History Narrative   Left handed   Lives in a one story home    Drinks coffee    Social Determinants of Health   Financial Resource Strain: Low Risk    Difficulty of Paying Living Expenses: Not hard at all  Food Insecurity: No Food Insecurity   Worried About Charity fundraiser in the Last Year: Never true   [place] in the Last Year: Never true  Transportation Needs: No Transportation Needs   Lack of Transportation (Medical): No   Lack of Transportation (Non-Medical): No  Physical Activity: Not on file  Stress: No Stress Concern Present   Feeling of Stress : Only a little  Social Connections: Socially Isolated   Frequency of Communication with Friends and Family: More than three times a week   Frequency of Social Gatherings with Friends and Family: More than three times a week   Attends Religious Services: Never   Marine scientist or Organizations: No   Attends Archivist Meetings: Never   Marital Status: Widowed  Intimate Partner Violence: Not on file   Family History  Problem Relation Age of Onset   Leukemia Mother    Heart attack Father        Review of Systems  All other systems reviewed and are negative.     Objective:   Physical Exam Constitutional:      General: He is not in acute distress.    Appearance: Normal appearance. He is not ill-appearing or toxic-appearing.  Neck:     Vascular: Carotid bruit present.  Cardiovascular:     Rate and Rhythm: Normal rate and regular rhythm.     Heart sounds: Normal heart sounds. No murmur heard.   No gallop.  Pulmonary:     Effort:  Pulmonary effort is normal. No respiratory distress.     Breath sounds: Normal breath sounds. No stridor. No wheezing or rales.  Abdominal:     General: Abdomen is flat. Bowel sounds are normal. There is no distension.     Palpations: Abdomen is soft.     Tenderness: There is no abdominal tenderness. There is no guarding.  Musculoskeletal:     Cervical back: Neck supple.     Right lower leg: No edema.     Left lower leg: No edema.  Lymphadenopathy:     Cervical: No cervical adenopathy.  Skin:    Coloration: Skin is not jaundiced.  Neurological:     General: No focal deficit present.     Mental Status:  He is alert and oriented to person, place, and time. Mental status is at baseline.     Cranial Nerves: No cranial nerve deficit.     Sensory: No sensory deficit.     Motor: No weakness.     Coordination: Coordination normal.          Assessment & Plan:   Acquired hypothyroidism - Plan: TSH  Gastroesophageal reflux disease with esophagitis without hemorrhage  Ulcer of esophagus without bleeding  Stenosis of right carotid artery - Plan: US Carotid Duplex Bilateral, CBC with Differential/Platelet, Lipid panel, COMPLETE METABOLIC PANEL WITH GFR  Carotid stenosis, asymptomatic, right - Plan: US Carotid Duplex Bilateral Patient has protein calorie malnutrition.  I encouraged him to drink milk, eat starches such as bread and potatoes, and chronically protein intake.  I encouraged him to drink Ensure.  I also encouraged him abstinence from alcohol.  I will check a TSH to ensure adequate dosage of his levothyroxine.  His blood pressure today is acceptable.  Check a CBC to monitor for any anemia.  Check a CMP to monitor renal function and liver function test.  Check a fasting lipid panel.  Goal LDL cholesterol is less than 70.  I will have my nurse check with pharmacist determine which statin he is on to ensure that he is not duplicating statin therapy.  Schedule carotid Doppler to monitor  his internal carotid stenosis.  Immunizations are up-to-date.  Encouraged him to follow-up as planned with GI.

## 2022-03-10 ENCOUNTER — Encounter: Payer: Self-pay | Admitting: Oncology

## 2022-03-10 LAB — CBC WITH DIFFERENTIAL/PLATELET
Absolute Monocytes: 525 cells/uL (ref 200–950)
Basophils Absolute: 40 cells/uL (ref 0–200)
Basophils Relative: 0.8 %
Eosinophils Absolute: 140 cells/uL (ref 15–500)
Eosinophils Relative: 2.8 %
HCT: 36.6 % — ABNORMAL LOW (ref 38.5–50.0)
Hemoglobin: 12.8 g/dL — ABNORMAL LOW (ref 13.2–17.1)
Lymphs Abs: 1780 cells/uL (ref 850–3900)
MCH: 35.1 pg — ABNORMAL HIGH (ref 27.0–33.0)
MCHC: 35 g/dL (ref 32.0–36.0)
MCV: 100.3 fL — ABNORMAL HIGH (ref 80.0–100.0)
MPV: 10 fL (ref 7.5–12.5)
Monocytes Relative: 10.5 %
Neutro Abs: 2515 cells/uL (ref 1500–7800)
Neutrophils Relative %: 50.3 %
Platelets: 132 10*3/uL — ABNORMAL LOW (ref 140–400)
RBC: 3.65 10*6/uL — ABNORMAL LOW (ref 4.20–5.80)
RDW: 13 % (ref 11.0–15.0)
Total Lymphocyte: 35.6 %
WBC: 5 10*3/uL (ref 3.8–10.8)

## 2022-03-10 LAB — COMPLETE METABOLIC PANEL WITH GFR
AG Ratio: 1.3 (calc) (ref 1.0–2.5)
ALT: 6 U/L — ABNORMAL LOW (ref 9–46)
AST: 19 U/L (ref 10–35)
Albumin: 3.8 g/dL (ref 3.6–5.1)
Alkaline phosphatase (APISO): 51 U/L (ref 35–144)
BUN: 9 mg/dL (ref 7–25)
CO2: 25 mmol/L (ref 20–32)
Calcium: 8.8 mg/dL (ref 8.6–10.3)
Chloride: 94 mmol/L — ABNORMAL LOW (ref 98–110)
Creat: 0.87 mg/dL (ref 0.70–1.28)
Globulin: 2.9 g/dL (calc) (ref 1.9–3.7)
Glucose, Bld: 75 mg/dL (ref 65–99)
Potassium: 4.2 mmol/L (ref 3.5–5.3)
Sodium: 128 mmol/L — ABNORMAL LOW (ref 135–146)
Total Bilirubin: 0.4 mg/dL (ref 0.2–1.2)
Total Protein: 6.7 g/dL (ref 6.1–8.1)
eGFR: 91 mL/min/{1.73_m2} (ref >=60)

## 2022-03-10 LAB — LIPID PANEL
Cholesterol: 161 mg/dL (ref <200)
HDL: 85 mg/dL (ref >=40)
LDL Cholesterol (Calc): 60 mg/dL (calc)
Non-HDL Cholesterol (Calc): 76 mg/dL (calc) (ref <130)
Total CHOL/HDL Ratio: 1.9 (calc) (ref <5.0)
Triglycerides: 78 mg/dL (ref <150)

## 2022-03-10 LAB — TSH: TSH: 15.22 mIU/L — ABNORMAL HIGH (ref 0.40–4.50)

## 2022-03-13 ENCOUNTER — Telehealth: Payer: Self-pay

## 2022-03-13 NOTE — Telephone Encounter (Signed)
-----   Message from Susy Frizzle, MD sent at 03/13/2022  6:56 AM EDT ----- Thyroid is too low.  Increase levothyroxine to 100 mcg poqday and recheck in 6 weeks (TSH).  Sodium is too low at 128.  Restrict fluids to less than 30 oz per day and recheck bmp this week.

## 2022-03-13 NOTE — Telephone Encounter (Signed)
I have attempted without success to contact this patient by phone to discuss lab results.  Vm wasn't set up yet .

## 2022-03-21 ENCOUNTER — Ambulatory Visit
Admission: RE | Admit: 2022-03-21 | Discharge: 2022-03-21 | Disposition: A | Discharge status: Home / Self Care | Payer: Medicare HMO | Source: Ambulatory Visit | Attending: Family Medicine | Admitting: Family Medicine

## 2022-03-21 DIAGNOSIS — I6521 Occlusion and stenosis of right carotid artery: Secondary | ICD-10-CM

## 2022-03-21 DIAGNOSIS — I6523 Occlusion and stenosis of bilateral carotid arteries: Secondary | ICD-10-CM | POA: Diagnosis not present

## 2022-03-27 ENCOUNTER — Telehealth: Payer: Self-pay | Admitting: Pharmacist

## 2022-03-27 NOTE — Progress Notes (Signed)
Chronic Care Management Pharmacy Assistant   Name: Lee Wilson  MRN: [id number] DOB: 04-Jul-1946   Reason for Encounter: Disease State - General Adherence Call     Recent office visits:  03/09/22 Jenna Luo MD - Aqquired hypothyroidism - Labs were ordered. Carotid US ordered. Follow up as scheduled.   Recent consult visits:  None noted.   Hospital visits: 01/09/22 Medication Reconciliation was completed by comparing discharge summary, patient's EMR and Pharmacy list, and upon discussion with patient.  Admitted to the hospital on 01/09/22 due to EGD. Discharge date was 01/09/22. Discharged from [place]?Medications Started at [facility] Discharge:?? None noted.   Medication Changes at Hospital Discharge: None noted.   Medications Discontinued at Hospital Discharge: None noted.   Medications that remain the same after Hospital Discharge:??  All other medications will remain the same.    Medications: Outpatient Encounter Medications as of 03/27/2022  Medication Sig Note   albuterol (VENTOLIN HFA) 108 (90 Base) MCG/ACT inhaler Inhale 2 puffs into the lungs every 4 (four) hours as needed for wheezing or shortness of breath.    aspirin EC 81 MG tablet Take 81 mg by mouth daily. Swallow whole.    diclofenac Sodium (VOLTAREN) 1 % GEL Apply to back three times as needed for pain    Ensure (ENSURE) Take 237 mLs by mouth See admin instructions. Strawberry flavor - drink one to two cans by mouth daily    famotidine (PEPCID) 40 MG tablet Take 1 tablet (40 mg total) by mouth 2 (two) times daily.    HYDROcodone-acetaminophen (HYCET) 7.5-325 mg/15 ml solution Take 10 mLs by mouth every 6 (six) hours as needed for moderate pain.    hydrocortisone 2.5 % cream Apply topically 2 (two) times daily. Apply to face (Patient taking differently: Apply 1 application. topically 2 (two) times daily as needed (facial dry skin/itching).)    lansoprazole (PREVACID) 30 MG capsule  Take 1 capsule (30 mg total) by mouth 2 (two) times daily before a meal.    levothyroxine (SYNTHROID) 88 MCG tablet Take 1 tablet (88 mcg total) by mouth daily before breakfast.    Multiple Vitamin (MULTIVITAMIN WITH MINERALS) TABS tablet Take 1 tablet by mouth daily.    pantoprazole (PROTONIX) 40 MG tablet Take 40 mg by mouth 2 (two) times daily. 01/08/2022: Last filled 12/06/21   protein supplement (RESOURCE BENEPROTEIN) 6 g POWD Take 1 Scoop (6 g total) by mouth 3 (three) times daily with meals.    simvastatin (ZOCOR) 80 MG tablet Take 1 tablet (80 mg total) by mouth at bedtime.    sucralfate (CARAFATE) 1 g tablet Take 1 tablet (1 g total) by mouth 4 (four) times daily -  with meals and at bedtime.    vitamin B-12 (CYANOCOBALAMIN) 1000 MCG tablet Take 1,000 mcg by mouth daily.    No facility-administered encounter medications on file as of 03/27/2022.    Contacted Lee Wilson for General Review Call   Chart Review:  Have there been any documented new, changed, or discontinued medications since last visit? No (If yes, include name, dose, frequency, date) Has there been any documented recent hospitalizations or ED visits since last visit with Clinical Pharmacist? Yes Brief Summary (including medication and/or Diagnosis changes): EGD done 01/09/22   Adherence Review:  Does the Clinical Pharmacist Assistant have access to adherence rates? Yes Adherence rates for STAR metric medications (List medication(s)/day supply/ last 2 fill dates). Adherence rates for medications indicated for  disease state being reviewed (List medication(s)/day supply/ last 2 fill dates). Does the patient have >5 day gap between last estimated fill dates for any of the above medications or other medication gaps? No Reason for medication gaps.   Disease State Questions:  Able to connect with Patient?  Did patient have any problems with their health recently?  Note problems and Concerns: Have you had any  admissions or emergency room visits or worsening of your condition(s) since last visit?  Details of ED visit, hospital visit and/or worsening condition(s): Have you had any visits with new specialists or providers since your last visit?  Explain: Have you had any new health care problem(s) since your last visit?  New problem(s) reported: Have you run out of any of your medications since you last spoke with clinical pharmacist?  What caused you to run out of your medications? Are there any medications you are not taking as prescribed?  What kept you from taking your medications as prescribed? Are you having any issues or side effects with your medications?  Note of issues or side effects: Do you have any other health concerns or questions you want to discuss with your Clinical Pharmacist before your next visit?  Note additional concerns and questions from Patient. Are there any health concerns that you feel we can do a better job addressing?  Note Patient's response. Are you having any problems with any of the following since the last visit: (select all that apply)    Details: 12. Any falls since last visit?   Details: 13. Any increased or uncontrolled pain since last visit?   Details: 14. Next visit Type: office       Visit with: Dr Dennard Schaumann         Date: 08/31/22        Time: 10:15am  15. Additional Details?     Care Gaps   AWV: unknown Colonoscopy: EGD done 01/09/22 DM Eye Exam: N/A DM Foot Exam: N/A Microalbumin: N/A HbgAIC: N/A DEXA:  N/A Mammogram: N/A     Star Rating Drugs: Simvastatin (ZOCOR) 80 MG tablet - last filled  03/09/22 90 days    Future Appointments  Date Time Provider [place]  08/31/2022 10:15 AM Susy Frizzle, MD [facility]   Multiple attempts were made to contact patient. Attempts were unsuccessful. / ls,CMA    Jobe Gibbon, [place] Pharmacist Assistant  [phone]

## 2022-04-30 ENCOUNTER — Telehealth: Payer: Self-pay | Admitting: Pharmacist

## 2022-04-30 NOTE — Progress Notes (Signed)
Chronic Care Management Pharmacy Assistant   Name: Lee Wilson  MRN: [id number] DOB: May 17, 1946   Reason for Encounter: Disease State - General Adherence Call     Recent office visits:  None noted.   Recent consult visits:  None noted.    Hospital visits: 01/09/22 Medication Reconciliation was completed by comparing discharge summary, patient's EMR and Pharmacy list, and upon discussion with patient.   Admitted to the hospital on 01/09/22 due to EGD. Discharge date was 01/09/22. Discharged from [place]?Medications Started at [facility] Discharge:?? None noted.    Medication Changes at Hospital Discharge: None noted.    Medications Discontinued at Hospital Discharge: None noted.    Medications that remain the same after Hospital Discharge:??  All other medications will remain the same.     Medications: Outpatient Encounter Medications as of 04/30/2022  Medication Sig Note   albuterol (VENTOLIN HFA) 108 (90 Base) MCG/ACT inhaler Inhale 2 puffs into the lungs every 4 (four) hours as needed for wheezing or shortness of breath.    aspirin EC 81 MG tablet Take 81 mg by mouth daily. Swallow whole.    diclofenac Sodium (VOLTAREN) 1 % GEL Apply to back three times as needed for pain    Ensure (ENSURE) Take 237 mLs by mouth See admin instructions. Strawberry flavor - drink one to two cans by mouth daily    famotidine (PEPCID) 40 MG tablet Take 1 tablet (40 mg total) by mouth 2 (two) times daily.    HYDROcodone-acetaminophen (HYCET) 7.5-325 mg/15 ml solution Take 10 mLs by mouth every 6 (six) hours as needed for moderate pain.    hydrocortisone 2.5 % cream Apply topically 2 (two) times daily. Apply to face (Patient taking differently: Apply 1 application. topically 2 (two) times daily as needed (facial dry skin/itching).)    lansoprazole (PREVACID) 30 MG capsule Take 1 capsule (30 mg total) by mouth 2 (two) times daily before a meal.    levothyroxine  (SYNTHROID) 88 MCG tablet Take 1 tablet (88 mcg total) by mouth daily before breakfast.    Multiple Vitamin (MULTIVITAMIN WITH MINERALS) TABS tablet Take 1 tablet by mouth daily.    pantoprazole (PROTONIX) 40 MG tablet Take 40 mg by mouth 2 (two) times daily. 01/08/2022: Last filled 12/06/21   protein supplement (RESOURCE BENEPROTEIN) 6 g POWD Take 1 Scoop (6 g total) by mouth 3 (three) times daily with meals.    simvastatin (ZOCOR) 80 MG tablet Take 1 tablet (80 mg total) by mouth at bedtime.    sucralfate (CARAFATE) 1 g tablet Take 1 tablet (1 g total) by mouth 4 (four) times daily -  with meals and at bedtime.    vitamin B-12 (CYANOCOBALAMIN) 1000 MCG tablet Take 1,000 mcg by mouth daily.    No facility-administered encounter medications on file as of 04/30/2022.    Contacted [name] for General Review Call     Chart Review:   Have there been any documented new, changed, or discontinued medications since last visit? No (If yes, include name, dose, frequency, date) No Has there been any documented recent hospitalizations or ED visits since last visit with Clinical Pharmacist? Yes Brief Summary (including medication and/or Diagnosis changes): EGD done 01/09/22     Adherence Review:   Does the Clinical Pharmacist Assistant have access to adherence rates? Yes Adherence rates for STAR metric medications (List medication(s)/day supply/ last 2 fill dates). Adherence rates for medications indicated for disease state  being reviewed (List medication(s)/day supply/ last 2 fill dates). Does the patient have >5 day gap between last estimated fill dates for any of the above medications or other medication gaps? No Reason for medication gaps.     Disease State Questions:   Able to connect with Patient?  Did patient have any problems with their health recently?  Note problems and Concerns:No Have you had any admissions or emergency room visits or worsening of your condition(s) since last  visit? yes Details of ED visit, hospital visit and/or worsening condition(s): see above  Have you had any visits with new specialists or providers since your last visit? No  Have you had any new health care problem(s) since your last visit? No  Have you run out of any of your medications since you last spoke with clinical pharmacist? No  Are there any medications you are not taking as prescribed?  What kept you from taking your medications as prescribed? No  Are you having any issues or side effects with your medications? No  Do you have any other health concerns or questions you want to discuss with your Clinical Pharmacist before your next visit? No  Are there any health concerns that you feel we can do a better job addressing? No  Are you having any problems with any of the following since the last visit: (select all that apply) None              Details: 12. Any falls since last visit?              Details: 13. Any increased or uncontrolled pain since last visit?              Details:  14. Next visit Type: office       Visit with: Dr Dennard Schaumann         Date: 08/31/22        Time: 10:15am   15. Additional Details?                 Care Gaps   AWV: unknown Colonoscopy: EGD done 01/09/22 DM Eye Exam: N/A DM Foot Exam: N/A Microalbumin: N/A HbgAIC: N/A DEXA:  N/A Mammogram: N/A     Star Rating Drugs: Simvastatin (ZOCOR) 80 MG tablet - last filled  03/09/22 90 days    Future Appointments  Date Time Provider [place]  06/28/2022  9:15 AM [facility]  08/31/2022 10:15 AM Pickard, Cammie Mcgee, MD [facility]   Multiple attempts were made to contact patient. Attempts were unsuccessful. / ls,CMA    Jobe Gibbon, [place] Pharmacist Assistant  [phone]

## 2022-05-07 ENCOUNTER — Other Ambulatory Visit: Payer: Self-pay

## 2022-05-15 ENCOUNTER — Other Ambulatory Visit: Payer: Self-pay

## 2022-05-24 ENCOUNTER — Telehealth: Payer: Self-pay | Admitting: Pharmacist

## 2022-05-24 NOTE — Progress Notes (Signed)
Chronic Care Management Pharmacy Assistant   Name: Lee Wilson  MRN: [id number] DOB: 12-Dec-1945   Reason for Encounter: Monthly Medication Coordination Call    Medications: Outpatient Encounter Medications as of 05/24/2022  Medication Sig Note   albuterol (VENTOLIN HFA) 108 (90 Base) MCG/ACT inhaler Inhale 2 puffs into the lungs every 4 (four) hours as needed for wheezing or shortness of breath.    aspirin EC 81 MG tablet Take 81 mg by mouth daily. Swallow whole.    diclofenac Sodium (VOLTAREN) 1 % GEL Apply to back three times as needed for pain    Ensure (ENSURE) Take 237 mLs by mouth See admin instructions. Strawberry flavor - drink one to two cans by mouth daily    famotidine (PEPCID) 40 MG tablet Take 1 tablet (40 mg total) by mouth 2 (two) times daily.    HYDROcodone-acetaminophen (HYCET) 7.5-325 mg/15 ml solution Take 10 mLs by mouth every 6 (six) hours as needed for moderate pain.    hydrocortisone 2.5 % cream Apply topically 2 (two) times daily. Apply to face (Patient taking differently: Apply 1 application. topically 2 (two) times daily as needed (facial dry skin/itching).)    lansoprazole (PREVACID) 30 MG capsule Take 1 capsule (30 mg total) by mouth 2 (two) times daily before a meal.    levothyroxine (SYNTHROID) 88 MCG tablet Take 1 tablet (88 mcg total) by mouth daily before breakfast.    Multiple Vitamin (MULTIVITAMIN WITH MINERALS) TABS tablet Take 1 tablet by mouth daily.    pantoprazole (PROTONIX) 40 MG tablet Take 40 mg by mouth 2 (two) times daily. 01/08/2022: Last filled 12/06/21   protein supplement (RESOURCE BENEPROTEIN) 6 g POWD Take 1 Scoop (6 g total) by mouth 3 (three) times daily with meals.    simvastatin (ZOCOR) 80 MG tablet Take 1 tablet (80 mg total) by mouth at bedtime.    sucralfate (CARAFATE) 1 g tablet Take 1 tablet (1 g total) by mouth 4 (four) times daily -  with meals and at bedtime.    vitamin B-12 (CYANOCOBALAMIN) 1000 MCG tablet Take 1,000 mcg  by mouth daily.    No facility-administered encounter medications on file as of 05/24/2022.    Reviewed chart for medication changes ahead of medication coordination call.  No OVs, Consults, or hospital visits since last care coordination call/Pharmacist visit. (If appropriate, list visit date, provider name)  No medication changes indicated OR if recent visit, treatment plan here.  BP Readings from Last 3 Encounters:  03/09/22 116/70  01/09/22 100/64  01/04/22 117/68    No results found for: "HGBA1C"   Patient obtains medications through Adherence Packaging  90 Days   Last adherence delivery included: (medication name and frequency)  Previously Onboarded    Patient is due for next adherence delivery on: 06/05/22. Called patient and reviewed medications and coordinated delivery.   This delivery to include: lansoprazole   '30mg'$  simvastatin   '80mg'$  famotidine   '40mg'$  levothyroxine   73mg sucralfate   1gm Albuterol    1097m    Patient needs refills from PCP for 90 day supply Levothyroxine 8862mSucralfate  1gm Albuterol (Ventolin) 108m66m Confirmed delivery date of 06/05/22, advised patient that pharmacy will contact them the morning of delivery.   Care Gaps   AWV: unknown Colonoscopy: EGD done 01/09/22 DM Eye Exam: N/A DM Foot Exam: N/A Microalbumin: N/A HbgAIC: N/A DEXA:  N/A Mammogram: N/A     Star Rating Drugs: Simvastatin (ZOCOR) 80 MG tablet -  last filled  03/09/22 90 days     Future Appointments  Date Time Provider [place]  06/28/2022  9:15 AM [facility]  08/31/2022 10:15 AM Pickard, Cammie Mcgee, MD [facility] [place], [facility] Clinical Pharmacist Assistant  [phone]

## 2022-05-29 ENCOUNTER — Other Ambulatory Visit: Payer: Self-pay | Admitting: Family Medicine

## 2022-05-29 DIAGNOSIS — E039 Hypothyroidism, unspecified: Secondary | ICD-10-CM

## 2022-05-29 DIAGNOSIS — J410 Simple chronic bronchitis: Secondary | ICD-10-CM

## 2022-05-29 MED ORDER — LEVOTHYROXINE SODIUM 88 MCG PO TABS: 88.0000 ug | ORAL_TABLET | Freq: Every day | ORAL | 1 refills | Status: DC

## 2022-05-29 MED ORDER — SUCRALFATE 1 G PO TABS: 1.0000 g | ORAL_TABLET | Freq: Three times a day (TID) | ORAL | 1 refills | Status: DC

## 2022-05-29 MED ORDER — ALBUTEROL SULFATE HFA 108 (90 BASE) MCG/ACT IN AERS: 2.0000 | INHALATION_SPRAY | RESPIRATORY_TRACT | 2 refills | Status: DC | PRN

## 2022-06-08 ENCOUNTER — Encounter (INDEPENDENT_AMBULATORY_CARE_PROVIDER_SITE_OTHER): Payer: Self-pay | Admitting: *Deleted

## 2022-06-28 ENCOUNTER — Other Ambulatory Visit: Payer: Medicare HMO

## 2022-08-21 ENCOUNTER — Other Ambulatory Visit (INDEPENDENT_AMBULATORY_CARE_PROVIDER_SITE_OTHER): Payer: Self-pay | Admitting: Gastroenterology

## 2022-08-23 ENCOUNTER — Telehealth: Payer: Self-pay | Admitting: Pharmacist

## 2022-08-23 NOTE — Progress Notes (Signed)
Chronic Care Management Pharmacy Assistant   Name: Lee Wilson  MRN: [id number] DOB: 09/12/1946   Reason for Encounter: Monthly Medication Coordination Call      Medications: Outpatient Encounter Medications as of 08/23/2022  Medication Sig Note   albuterol (VENTOLIN HFA) 108 (90 Base) MCG/ACT inhaler Inhale 2 puffs into the lungs every 4 (four) hours as needed for wheezing or shortness of breath.    aspirin EC 81 MG tablet Take 81 mg by mouth daily. Swallow whole.    diclofenac Sodium (VOLTAREN) 1 % GEL Apply to back three times as needed for pain    Ensure (ENSURE) Take 237 mLs by mouth See admin instructions. Strawberry flavor - drink one to two cans by mouth daily    famotidine (PEPCID) 40 MG tablet TAKE ONE TABLET BY MOUTH EVERY MORNING and TAKE ONE TABLET BY MOUTH EVERY EVENING    HYDROcodone-acetaminophen (HYCET) 7.5-325 mg/15 ml solution Take 10 mLs by mouth every 6 (six) hours as needed for moderate pain.    hydrocortisone 2.5 % cream Apply topically 2 (two) times daily. Apply to face (Patient taking differently: Apply 1 application. topically 2 (two) times daily as needed (facial dry skin/itching).)    lansoprazole (PREVACID) 30 MG capsule Take 1 capsule (30 mg total) by mouth 2 (two) times daily before a meal.    levothyroxine (SYNTHROID) 88 MCG tablet Take 1 tablet (88 mcg total) by mouth daily before breakfast.    Multiple Vitamin (MULTIVITAMIN WITH MINERALS) TABS tablet Take 1 tablet by mouth daily.    pantoprazole (PROTONIX) 40 MG tablet Take 40 mg by mouth 2 (two) times daily. 01/08/2022: Last filled 12/06/21   protein supplement (RESOURCE BENEPROTEIN) 6 g POWD Take 1 Scoop (6 g total) by mouth 3 (three) times daily with meals.    simvastatin (ZOCOR) 80 MG tablet Take 1 tablet (80 mg total) by mouth at bedtime.    sucralfate (CARAFATE) 1 g tablet Take 1 tablet (1 g total) by mouth 4 (four) times daily -  with meals and at bedtime.    vitamin B-12 (CYANOCOBALAMIN) 1000  MCG tablet Take 1,000 mcg by mouth daily.    No facility-administered encounter medications on file as of 08/23/2022.    Reviewed chart for medication changes ahead of medication coordination call.  No OVs, Consults, or hospital visits since last care coordination call/Pharmacist visit. (If appropriate, list visit date, provider name)  No medication changes indicated OR if recent visit, treatment plan here.  BP Readings from Last 3 Encounters:  03/09/22 116/70  01/09/22 100/64  01/04/22 117/68    No results found for: "HGBA1C"   Patient obtains medications through Adherence Packaging  90 Days   Last adherence delivery included: (medication name and frequency)  lansoprazole  '30mg'$  simvastatin  '80mg'$  famotidine  '40mg'$  levothyroxine  44mg sucralfate 1gm Albuterol 1047m    Patient is due for next adherence delivery on: 09/04/22. Called patient and reviewed medications and coordinated delivery.   This delivery to include:  lansoprazole  '30mg'$  simvastatin  '80mg'$  famotidine  '40mg'$  levothyroxine  8842msucralfate 1gm  Declined - has plenty on hand Albuterol 108m55m  Patient needs refills for 90 days from PCP lansoprazole '30mg'$  From specialist famotidine  '40mg'$   Confirmed delivery date of 09/04/22, advised patient that pharmacy will contact them the morning of delivery.   Care Gaps   AWV: unknown Colonoscopy: EGD done 01/09/22 DM Eye Exam: N/A DM Foot Exam: N/A Microalbumin: N/A HbgAIC: N/A DEXA:  N/A Mammogram: N/A     Star Rating Drugs: Simvastatin (ZOCOR) 80 MG tablet - last filled  05/31/22 90 days    Future Appointments  Date Time Provider [place]  08/31/2022 10:15 AM Pickard, Cammie Mcgee, MD [facility] [place], [facility] Clinical Pharmacist Assistant  [phone]

## 2022-08-28 ENCOUNTER — Other Ambulatory Visit: Payer: Self-pay

## 2022-08-28 ENCOUNTER — Other Ambulatory Visit: Payer: Self-pay | Admitting: Family Medicine

## 2022-08-28 DIAGNOSIS — K2100 Gastro-esophageal reflux disease with esophagitis, without bleeding: Secondary | ICD-10-CM

## 2022-08-28 MED ORDER — LANSOPRAZOLE 30 MG PO CPDR: 30.0000 mg | DELAYED_RELEASE_CAPSULE | Freq: Two times a day (BID) | ORAL | 6 refills | Status: DC

## 2022-08-28 NOTE — Telephone Encounter (Signed)
Refilled 03/08/2022 #[id number] rf. Requested Prescriptions  Pending Prescriptions Disp Refills   lansoprazole (PREVACID) 30 MG capsule [Pharmacy Med Name: lansoprazole 30 mg capsule,delayed release] 60 capsule 6    Sig: TAKE ONE CAPSULE BY MOUTH EVERY MORNING and TAKE ONE CAPSULE BY MOUTH EVERY EVENING     Gastroenterology: Proton Pump Inhibitors 2 Failed - 08/28/2022 11:29 AM      Failed - ALT in normal range and within 360 days    ALT  Date Value Ref Range Status  03/09/2022 6 (L) 9 - 46 U/L Final  06/07/2021 9 0 - 44 U/L Final         Passed - AST in normal range and within 360 days    AST  Date Value Ref Range Status  03/09/2022 19 10 - 35 U/L Final  06/07/2021 16 15 - 41 U/L Final         Passed - Valid encounter within last 12 months    Recent Outpatient Visits           5 months ago Acquired hypothyroidism   [place] Susy Frizzle, MD   10 months ago Simple chronic bronchitis ([place])   [facility] Eulogio Bear, NP   1 year ago Simple chronic bronchitis ([place])   [facility] Medicine Eulogio Bear, NP   1 year ago Chest pain, unspecified type   [place] Eulogio Bear, NP   1 year ago Gastroesophageal reflux disease with esophagitis without hemorrhage   [facility] Eulogio Bear, NP

## 2022-08-31 ENCOUNTER — Ambulatory Visit: Payer: Self-pay | Admitting: Family Medicine

## 2022-10-17 ENCOUNTER — Telehealth: Payer: Self-pay | Admitting: Family Medicine

## 2022-10-17 NOTE — Telephone Encounter (Signed)
No answer unable to leave a message for patient to call back and schedule Medicare Annual Wellness Visit (AWV) in office.   If not able to come in office, please offer to do virtually or by telephone.   Last AWV:12/09/2020   Please schedule at any time with [facility].  30 minute appointment  Any questions, please contact me at [phone]   Thank you,   [facility]  Ambulatory Clinical Support for [place] [facility] ??[phone] or ??[phone]

## 2022-11-05 DIAGNOSIS — R404 Transient alteration of awareness: Secondary | ICD-10-CM | POA: Diagnosis not present

## 2022-11-05 DIAGNOSIS — Z7901 Long term (current) use of anticoagulants: Secondary | ICD-10-CM | POA: Diagnosis not present

## 2022-11-05 DIAGNOSIS — Z8501 Personal history of malignant neoplasm of esophagus: Secondary | ICD-10-CM | POA: Diagnosis not present

## 2022-11-05 DIAGNOSIS — Z20822 Contact with and (suspected) exposure to covid-19: Secondary | ICD-10-CM | POA: Diagnosis not present

## 2022-11-05 DIAGNOSIS — R7402 Elevation of levels of lactic acid dehydrogenase (LDH): Secondary | ICD-10-CM | POA: Diagnosis not present

## 2022-11-05 DIAGNOSIS — R55 Syncope and collapse: Secondary | ICD-10-CM | POA: Diagnosis not present

## 2022-11-05 DIAGNOSIS — I6523 Occlusion and stenosis of bilateral carotid arteries: Secondary | ICD-10-CM | POA: Diagnosis not present

## 2022-11-05 DIAGNOSIS — K047 Periapical abscess without sinus: Secondary | ICD-10-CM | POA: Diagnosis not present

## 2022-11-05 DIAGNOSIS — E039 Hypothyroidism, unspecified: Secondary | ICD-10-CM | POA: Diagnosis not present

## 2022-11-05 DIAGNOSIS — J3489 Other specified disorders of nose and nasal sinuses: Secondary | ICD-10-CM | POA: Diagnosis not present

## 2022-11-05 DIAGNOSIS — R569 Unspecified convulsions: Secondary | ICD-10-CM | POA: Diagnosis not present

## 2022-11-05 DIAGNOSIS — I639 Cerebral infarction, unspecified: Secondary | ICD-10-CM | POA: Diagnosis not present

## 2022-11-05 DIAGNOSIS — G319 Degenerative disease of nervous system, unspecified: Secondary | ICD-10-CM | POA: Diagnosis not present

## 2022-11-05 DIAGNOSIS — E43 Unspecified severe protein-calorie malnutrition: Secondary | ICD-10-CM | POA: Diagnosis not present

## 2022-11-05 DIAGNOSIS — I672 Cerebral atherosclerosis: Secondary | ICD-10-CM | POA: Diagnosis not present

## 2022-11-05 DIAGNOSIS — J45909 Unspecified asthma, uncomplicated: Secondary | ICD-10-CM | POA: Diagnosis not present

## 2022-11-05 DIAGNOSIS — Z91199 Patient's noncompliance with other medical treatment and regimen due to unspecified reason: Secondary | ICD-10-CM | POA: Diagnosis not present

## 2022-11-05 DIAGNOSIS — E7800 Pure hypercholesterolemia, unspecified: Secondary | ICD-10-CM | POA: Diagnosis not present

## 2022-11-05 DIAGNOSIS — R4182 Altered mental status, unspecified: Secondary | ICD-10-CM | POA: Diagnosis not present

## 2022-11-05 DIAGNOSIS — Z681 Body mass index (BMI) 19 or less, adult: Secondary | ICD-10-CM | POA: Diagnosis not present

## 2022-11-05 DIAGNOSIS — Z79899 Other long term (current) drug therapy: Secondary | ICD-10-CM | POA: Diagnosis not present

## 2022-11-05 DIAGNOSIS — R0902 Hypoxemia: Secondary | ICD-10-CM | POA: Diagnosis not present

## 2022-11-05 DIAGNOSIS — Z79890 Hormone replacement therapy (postmenopausal): Secondary | ICD-10-CM | POA: Diagnosis not present

## 2022-11-05 DIAGNOSIS — I651 Occlusion and stenosis of basilar artery: Secondary | ICD-10-CM | POA: Diagnosis not present

## 2022-11-06 DIAGNOSIS — R569 Unspecified convulsions: Secondary | ICD-10-CM | POA: Diagnosis not present

## 2022-11-06 DIAGNOSIS — E039 Hypothyroidism, unspecified: Secondary | ICD-10-CM | POA: Diagnosis not present

## 2022-11-06 DIAGNOSIS — I6523 Occlusion and stenosis of bilateral carotid arteries: Secondary | ICD-10-CM | POA: Diagnosis not present

## 2022-11-06 DIAGNOSIS — Z91148 Patient's other noncompliance with medication regimen for other reason: Secondary | ICD-10-CM | POA: Insufficient documentation

## 2022-11-06 DIAGNOSIS — I672 Cerebral atherosclerosis: Secondary | ICD-10-CM | POA: Diagnosis not present

## 2022-11-06 DIAGNOSIS — J3489 Other specified disorders of nose and nasal sinuses: Secondary | ICD-10-CM | POA: Diagnosis not present

## 2022-11-06 DIAGNOSIS — R7402 Elevation of levels of lactic acid dehydrogenase (LDH): Secondary | ICD-10-CM | POA: Diagnosis not present

## 2022-11-06 DIAGNOSIS — R4182 Altered mental status, unspecified: Secondary | ICD-10-CM | POA: Insufficient documentation

## 2022-11-06 DIAGNOSIS — I639 Cerebral infarction, unspecified: Secondary | ICD-10-CM | POA: Diagnosis not present

## 2022-11-06 DIAGNOSIS — I651 Occlusion and stenosis of basilar artery: Secondary | ICD-10-CM | POA: Diagnosis not present

## 2022-11-06 DIAGNOSIS — Z7901 Long term (current) use of anticoagulants: Secondary | ICD-10-CM | POA: Diagnosis not present

## 2022-11-06 DIAGNOSIS — R7989 Other specified abnormal findings of blood chemistry: Secondary | ICD-10-CM | POA: Insufficient documentation

## 2022-11-06 DIAGNOSIS — Z91199 Patient's noncompliance with other medical treatment and regimen due to unspecified reason: Secondary | ICD-10-CM | POA: Diagnosis not present

## 2022-11-06 DIAGNOSIS — Z79899 Other long term (current) drug therapy: Secondary | ICD-10-CM | POA: Diagnosis not present

## 2022-11-06 DIAGNOSIS — Z8501 Personal history of malignant neoplasm of esophagus: Secondary | ICD-10-CM | POA: Diagnosis not present

## 2022-11-06 DIAGNOSIS — G319 Degenerative disease of nervous system, unspecified: Secondary | ICD-10-CM | POA: Diagnosis not present

## 2022-11-06 DIAGNOSIS — Z79890 Hormone replacement therapy (postmenopausal): Secondary | ICD-10-CM | POA: Diagnosis not present

## 2022-11-07 ENCOUNTER — Other Ambulatory Visit: Payer: Self-pay

## 2022-11-07 DIAGNOSIS — E039 Hypothyroidism, unspecified: Secondary | ICD-10-CM | POA: Diagnosis not present

## 2022-11-07 DIAGNOSIS — R7402 Elevation of levels of lactic acid dehydrogenase (LDH): Secondary | ICD-10-CM | POA: Diagnosis not present

## 2022-11-07 DIAGNOSIS — Z91199 Patient's noncompliance with other medical treatment and regimen due to unspecified reason: Secondary | ICD-10-CM | POA: Diagnosis not present

## 2022-11-07 DIAGNOSIS — R4182 Altered mental status, unspecified: Secondary | ICD-10-CM | POA: Diagnosis not present

## 2022-11-07 DIAGNOSIS — E43 Unspecified severe protein-calorie malnutrition: Secondary | ICD-10-CM | POA: Diagnosis not present

## 2022-11-07 DIAGNOSIS — R569 Unspecified convulsions: Secondary | ICD-10-CM | POA: Diagnosis not present

## 2022-11-07 DIAGNOSIS — I639 Cerebral infarction, unspecified: Secondary | ICD-10-CM | POA: Diagnosis not present

## 2022-11-12 DIAGNOSIS — K219 Gastro-esophageal reflux disease without esophagitis: Secondary | ICD-10-CM | POA: Diagnosis not present

## 2022-11-12 DIAGNOSIS — G4089 Other seizures: Secondary | ICD-10-CM | POA: Diagnosis not present

## 2022-11-12 DIAGNOSIS — E039 Hypothyroidism, unspecified: Secondary | ICD-10-CM | POA: Diagnosis not present

## 2022-11-12 DIAGNOSIS — E43 Unspecified severe protein-calorie malnutrition: Secondary | ICD-10-CM | POA: Diagnosis not present

## 2022-11-12 DIAGNOSIS — Z515 Encounter for palliative care: Secondary | ICD-10-CM | POA: Diagnosis not present

## 2022-11-12 DIAGNOSIS — R1310 Dysphagia, unspecified: Secondary | ICD-10-CM | POA: Diagnosis not present

## 2022-11-12 DIAGNOSIS — I639 Cerebral infarction, unspecified: Secondary | ICD-10-CM | POA: Diagnosis not present

## 2022-11-12 DIAGNOSIS — E639 Nutritional deficiency, unspecified: Secondary | ICD-10-CM | POA: Diagnosis not present

## 2022-11-21 ENCOUNTER — Telehealth: Payer: Self-pay | Admitting: Pharmacist

## 2022-11-21 NOTE — Progress Notes (Unsigned)
Care Management & Coordination Services Pharmacy Team  Reason for Encounter: Medication coordination and delivery  Contacted patient to discuss medications and coordinate delivery from [facility] pharmacy. {US HC Outreach:28874} Cycle dispensing form sent to Leata Mouse, CPP for review.   Last adherence delivery date:     Patient is due for next adherence delivery on: 12/03/22  This delivery to include: Adherence Packaging  90 Days   lansoprazole  '30mg'$  simvastatin  '80mg'$  famotidine  '40mg'$  levothyroxine  82mg sucralfate 1gm Albuterol 1040m   Patient declined the following medications this month: N/A   Refills requested from providers include: levothyroxine  8863msucralfate 1gm  Delivery scheduled for 12/03/22. Unable to speak with patient to confirm date.    Any concerns about your medications? {yes/no:20286}  How often do you forget or accidentally miss a dose? {Missed doses:25554}  Do you use a pillbox? No  Is patient in packaging Yes  If yes  What is the date on your next pill pack?  Any concerns or issues with your packaging?   Recent blood pressure readings are as follows: N/A  Recent blood glucose readings are as follows: N/A   Medications: Outpatient Encounter Medications as of 11/21/2022  Medication Sig Note   albuterol (VENTOLIN HFA) 108 (90 Base) MCG/ACT inhaler Inhale 2 puffs into the lungs every 4 (four) hours as needed for wheezing or shortness of breath.    aspirin EC 81 MG tablet Take 81 mg by mouth daily. Swallow whole.    diclofenac Sodium (VOLTAREN) 1 % GEL Apply to back three times as needed for pain    Ensure (ENSURE) Take 237 mLs by mouth See admin instructions. Strawberry flavor - drink one to two cans by mouth daily    famotidine (PEPCID) 40 MG tablet TAKE ONE TABLET BY MOUTH EVERY MORNING and TAKE ONE TABLET BY MOUTH EVERY EVENING    HYDROcodone-acetaminophen (HYCET) 7.5-325 mg/15 ml solution Take 10 mLs by mouth every 6 (six) hours as needed  for moderate pain.    hydrocortisone 2.5 % cream Apply topically 2 (two) times daily. Apply to face (Patient taking differently: Apply 1 application. topically 2 (two) times daily as needed (facial dry skin/itching).)    lansoprazole (PREVACID) 30 MG capsule Take 1 capsule (30 mg total) by mouth 2 (two) times daily before a meal.    levothyroxine (SYNTHROID) 88 MCG tablet Take 1 tablet (88 mcg total) by mouth daily before breakfast.    Multiple Vitamin (MULTIVITAMIN WITH MINERALS) TABS tablet Take 1 tablet by mouth daily.    pantoprazole (PROTONIX) 40 MG tablet Take 40 mg by mouth 2 (two) times daily. 01/08/2022: Last filled 12/06/21   protein supplement (RESOURCE BENEPROTEIN) 6 g POWD Take 1 Scoop (6 g total) by mouth 3 (three) times daily with meals.    simvastatin (ZOCOR) 80 MG tablet Take 1 tablet (80 mg total) by mouth at bedtime.    sucralfate (CARAFATE) 1 g tablet Take 1 tablet (1 g total) by mouth 4 (four) times daily -  with meals and at bedtime.    vitamin B-12 (CYANOCOBALAMIN) 1000 MCG tablet Take 1,000 mcg by mouth daily.    No facility-administered encounter medications on file as of 11/21/2022.   BP Readings from Last 3 Encounters:  03/09/22 116/70  01/09/22 100/64  01/04/22 117/68    Pulse Readings from Last 3 Encounters:  01/09/22 63  01/04/22 85  10/04/21 96    No results found for: "HGBA1C" Lab Results  Component Value Date  CREATININE 0.87 03/09/2022   BUN 9 03/09/2022   GFRNONAA >60 06/26/2021   GFRAA >89 01/14/2015   NA 128 (L) 03/09/2022   K 4.2 03/09/2022   CALCIUM 8.8 03/09/2022   CO2 25 03/09/2022     No future appointments.   [facility], [facility]

## 2022-11-24 ENCOUNTER — Other Ambulatory Visit: Payer: Self-pay

## 2022-11-26 ENCOUNTER — Other Ambulatory Visit: Payer: Self-pay | Admitting: Family Medicine

## 2022-11-26 DIAGNOSIS — E039 Hypothyroidism, unspecified: Secondary | ICD-10-CM

## 2022-12-05 ENCOUNTER — Other Ambulatory Visit: Payer: Self-pay

## 2022-12-20 ENCOUNTER — Telehealth: Payer: Self-pay | Admitting: Pharmacist

## 2022-12-20 NOTE — Progress Notes (Signed)
Care Management & Coordination Services Pharmacy Team  Reason for Encounter: Medication coordination and delivery  Contacted patient to discuss medications and coordinate delivery from [facility] pharmacy. Unsuccessful outreach. Left voicemail for patient to return call. Cycle dispensing form sent to [facility] for review.   Last adherence delivery date: 09/04/22   Patient is due for next adherence delivery on: 01/01/23  This delivery to include: Adherence Packaging  30 Days   lansoprazole  '30mg'$  simvastatin  '80mg'$  famotidine  '40mg'$  levothyroxine  43mg sucralfate 1gm  Patient declined the following medications this month:   Refills requested from providers include: Lansoprazole  '30mg'$  (90 day supply requested)   Confirmed delivery date of 01/01/23, advised patient that pharmacy will contact them the morning of delivery.   Any concerns about your medications?   How often do you forget or accidentally miss a dose?   Do you use a pillbox?   Is patient in packaging Yes  If yes  What is the date on your next pill pack? 01/03/23  Any concerns or issues with your packaging?    Recent blood pressure readings are as follows: N/A  Recent blood glucose readings are as follows: N/A   Medications: Outpatient Encounter Medications as of 12/20/2022  Medication Sig Note   albuterol (VENTOLIN HFA) 108 (90 Base) MCG/ACT inhaler Inhale 2 puffs into the lungs every 4 (four) hours as needed for wheezing or shortness of breath.    aspirin EC 81 MG tablet Take 81 mg by mouth daily. Swallow whole.    diclofenac Sodium (VOLTAREN) 1 % GEL Apply to back three times as needed for pain    Ensure (ENSURE) Take 237 mLs by mouth See admin instructions. Strawberry flavor - drink one to two cans by mouth daily    famotidine (PEPCID) 40 MG tablet TAKE ONE TABLET BY MOUTH EVERY MORNING and TAKE ONE TABLET BY MOUTH EVERY EVENING    HYDROcodone-acetaminophen (HYCET) 7.5-325 mg/15 ml solution Take 10 mLs  by mouth every 6 (six) hours as needed for moderate pain.    hydrocortisone 2.5 % cream Apply topically 2 (two) times daily. Apply to face (Patient taking differently: Apply 1 application. topically 2 (two) times daily as needed (facial dry skin/itching).)    lansoprazole (PREVACID) 30 MG capsule Take 1 capsule (30 mg total) by mouth 2 (two) times daily before a meal.    levothyroxine (SYNTHROID) 88 MCG tablet TAKE ONE TABLET BY MOUTH BEFORE BREAKFAST    Multiple Vitamin (MULTIVITAMIN WITH MINERALS) TABS tablet Take 1 tablet by mouth daily.    pantoprazole (PROTONIX) 40 MG tablet Take 40 mg by mouth 2 (two) times daily. 01/08/2022: Last filled 12/06/21   protein supplement (RESOURCE BENEPROTEIN) 6 g POWD Take 1 Scoop (6 g total) by mouth 3 (three) times daily with meals.    simvastatin (ZOCOR) 80 MG tablet Take 1 tablet (80 mg total) by mouth at bedtime.    sucralfate (CARAFATE) 1 g tablet TAKE ONE TABLET BY MOUTH FOUR TIMES DAILY    vitamin B-12 (CYANOCOBALAMIN) 1000 MCG tablet Take 1,000 mcg by mouth daily.    No facility-administered encounter medications on file as of 12/20/2022.   BP Readings from Last 3 Encounters:  03/09/22 116/70  01/09/22 100/64  01/04/22 117/68    Pulse Readings from Last 3 Encounters:  01/09/22 63  01/04/22 85  10/04/21 96    No results found for: "HGBA1C" Lab Results  Component Value Date   CREATININE 0.87 03/09/2022   BUN 9 03/09/2022  GFRNONAA >60 06/26/2021   GFRAA >89 01/14/2015   NA 128 (L) 03/09/2022   K 4.2 03/09/2022   CALCIUM 8.8 03/09/2022   CO2 25 03/09/2022    Future Appointments  Date Time Provider [place]  01/03/2023 10:30 AM Rubie Maid, FNP [facility] [place], [facility]

## 2022-12-26 ENCOUNTER — Other Ambulatory Visit: Payer: Self-pay | Admitting: Family Medicine

## 2023-01-03 ENCOUNTER — Ambulatory Visit: Payer: Medicare HMO | Admitting: Family Medicine

## 2023-01-09 ENCOUNTER — Telehealth: Payer: Self-pay | Admitting: Family Medicine

## 2023-01-09 NOTE — Telephone Encounter (Signed)
Called patient to schedule Medicare Annual Wellness Visit (AWV). Left message for patient to call back and schedule Medicare Annual Wellness Visit (AWV).  Last date of AWV: 02/56/2022  Please schedule an appointment at any time with Loma Sousa, [facility] .  If any questions, please contact me at [phone].  Thank you,  Colletta [place],  [place] Program Direct Dial ??[phone]

## 2023-01-15 ENCOUNTER — Other Ambulatory Visit: Payer: Self-pay

## 2023-01-18 ENCOUNTER — Telehealth: Payer: Self-pay | Admitting: Pharmacist

## 2023-01-18 NOTE — Progress Notes (Unsigned)
Care Management & Coordination Services Pharmacy Team   Reason for Encounter: Medication coordination and delivery   Contacted patient to discuss medications and coordinate delivery from [facility]. Unsuccessful outreach. Unable to leave voicemail. VM not set up Cycle dispensing form sent to Erskine Emery, CPP for review.   Last adherence delivery date: 01/01/23      Patient is due for next adherence delivery on: 01/31/23  This delivery to include: Adherence Packaging  30 Days   lansoprazole  30mg  simvastatin  80mg  famotidine  40mg  levothyroxine  sucralfate 1gm  Patient declined the following medications this month:    Refills requested from providers include: (PCP 30 day supply) levothyroxine  sucralfate 1gm   {Delivery date:25786}   Any concerns about your medications? {yes/no:20286}  How often do you forget or accidentally miss a dose? {Missed doses:25554}  Do you use a pillbox? {yes/no:20286}  Is patient in packaging {yes/no:20286}  If yes  What is the date on your next pill pack?  Any concerns or issues with your packaging?    Medications: Outpatient Encounter Medications as of 01/18/2023  Medication Sig Note   albuterol (VENTOLIN HFA) 108 (90 Base) MCG/ACT inhaler Inhale 2 puffs into the lungs every 4 (four) hours as needed for wheezing or shortness of breath.    aspirin EC 81 MG tablet Take 81 mg by mouth daily. Swallow whole.    diclofenac Sodium (VOLTAREN) 1 % GEL Apply to back three times as needed for pain    Ensure (ENSURE) Take 237 mLs by mouth See admin instructions. Strawberry flavor - drink one to two cans by mouth daily    famotidine (PEPCID) 40 MG tablet TAKE ONE TABLET BY MOUTH EVERY MORNING and TAKE ONE TABLET BY MOUTH EVERY EVENING    HYDROcodone-acetaminophen (HYCET) 7.5-325 mg/15 ml solution Take 10 mLs by mouth every 6 (six) hours as needed for moderate pain.    hydrocortisone 2.5 % cream Apply topically 2 (two) times  daily. Apply to face (Patient taking differently: Apply 1 application. topically 2 (two) times daily as needed (facial dry skin/itching).)    lansoprazole (PREVACID) 30 MG capsule Take 1 capsule (30 mg total) by mouth 2 (two) times daily before a meal.    levothyroxine (SYNTHROID) 88 MCG tablet TAKE ONE TABLET BY MOUTH BEFORE BREAKFAST    Multiple Vitamin (MULTIVITAMIN WITH MINERALS) TABS tablet Take 1 tablet by mouth daily.    pantoprazole (PROTONIX) 40 MG tablet Take 40 mg by mouth 2 (two) times daily. 01/08/2022: Last filled 12/06/21   protein supplement (RESOURCE BENEPROTEIN) 6 g POWD Take 1 Scoop (6 g total) by mouth 3 (three) times daily with meals.    simvastatin (ZOCOR) 80 MG tablet Take 1 tablet (80 mg total) by mouth at bedtime.    sucralfate (CARAFATE) 1 g tablet TAKE ONE TABLET BY MOUTH FOUR TIMES DAILY Please schedule CPE appt with PCP for further refills    vitamin B-12 (CYANOCOBALAMIN) 1000 MCG tablet Take 1,000 mcg by mouth daily.    No facility-administered encounter medications on file as of 01/18/2023.   BP Readings from Last 3 Encounters:  03/09/22 116/70  01/09/22 100/64  01/04/22 117/68    Pulse Readings from Last 3 Encounters:  01/09/22 63  01/04/22 85  10/04/21 96    No results found for: "HGBA1C" Lab Results  Component Value Date   CREATININE 0.87 03/09/2022   BUN 9 03/09/2022   GFRNONAA >60 06/26/2021   GFRAA >89 01/14/2015   NA 128 (  L) 03/09/2022   K 4.2 03/09/2022   CALCIUM 8.8 03/09/2022   CO2 25 03/09/2022     Future Appointments  Date Time Provider Department Center  02/05/2023  9:30 AM Park MeoHoward, Amber S, FNP [facility]    [facility] [address]

## 2023-01-28 ENCOUNTER — Other Ambulatory Visit: Payer: Self-pay | Admitting: Family Medicine

## 2023-01-28 DIAGNOSIS — E039 Hypothyroidism, unspecified: Secondary | ICD-10-CM

## 2023-02-05 ENCOUNTER — Other Ambulatory Visit (INDEPENDENT_AMBULATORY_CARE_PROVIDER_SITE_OTHER): Payer: Self-pay

## 2023-02-05 ENCOUNTER — Telehealth: Payer: Self-pay | Admitting: Family Medicine

## 2023-02-05 ENCOUNTER — Encounter: Payer: Self-pay | Admitting: Family Medicine

## 2023-02-05 ENCOUNTER — Ambulatory Visit (INDEPENDENT_AMBULATORY_CARE_PROVIDER_SITE_OTHER): Payer: Medicare HMO | Admitting: Family Medicine

## 2023-02-05 VITALS — BP 120/80 | HR 75 | Temp 97.6°F | Ht 64.0 in | Wt 96.0 lb

## 2023-02-05 DIAGNOSIS — E039 Hypothyroidism, unspecified: Secondary | ICD-10-CM

## 2023-02-05 DIAGNOSIS — Z0000 Encounter for general adult medical examination without abnormal findings: Secondary | ICD-10-CM | POA: Diagnosis not present

## 2023-02-05 DIAGNOSIS — D539 Nutritional anemia, unspecified: Secondary | ICD-10-CM

## 2023-02-05 DIAGNOSIS — R569 Unspecified convulsions: Secondary | ICD-10-CM

## 2023-02-05 DIAGNOSIS — E871 Hypo-osmolality and hyponatremia: Secondary | ICD-10-CM

## 2023-02-05 DIAGNOSIS — Z1211 Encounter for screening for malignant neoplasm of colon: Secondary | ICD-10-CM

## 2023-02-05 DIAGNOSIS — Z23 Encounter for immunization: Secondary | ICD-10-CM

## 2023-02-05 NOTE — Telephone Encounter (Signed)
Outbound call placed to ask patient if he receives mail at the temporary address he provided [place] with today.  \

## 2023-02-05 NOTE — Assessment & Plan Note (Signed)
Chronic. Will check TSH today. He reports he has been taking Synthroid daily.

## 2023-02-05 NOTE — Assessment & Plan Note (Signed)
CBC today.  

## 2023-02-05 NOTE — Assessment & Plan Note (Signed)
Admitted to [facility] 11/05/2022 for AMS due to seizure vs CVA, "CTA head and neck does not reveal any LVO, MRI brain is somewhat equivocal for possible CVA versus artifact, and patient was not able to lie still enough to complete seizure protocol.". Was started on Keppra  BID that he filled one month of after hospitalization, this has not [facility] refilled [facility]. No deficits on exam today. Unable to identify a neurology referral in his chart from [facility] outside of being noted he should have close monitoring by Neurology, will place referral today.

## 2023-02-05 NOTE — Progress Notes (Signed)
New Patient Office Visit  Subjective    Patient ID: Lee Wilson, male    DOB: Feb 11, 1946  Age: 77 y.o. MRN: [id number]  CC:  Chief Complaint  Patient presents with   Establish Care    per pt passing out and went to ED/fu      HPI Lee Wilson presents to establish care. Oriented to practice routines and expectations. PMH as below. He was recently hospitalized in January for AMS and possible seizures vs stroke. He denies residual deficits since then or concerns. Has not followed up with Neurology or another provider since. See below for discharge note.  Colon CA screening: cologuard due Prostate CA screening: no longer needed Tobacco: non-smoker Vaccines:  Tdap today  Osahar MD 11/05/2022:  Patient did have workup for CVA and seizure during his hospitalization including CT, CTA head and neck, and MRI brain. CTA head and neck does not reveal any LVO, MRI brain is somewhat equivocal for possible CVA versus artifact, and patient was not able to lie still enough to complete seizure protocol. Patient will have close follow-up with neurology, case management has placed referral. Patient also noted to have alcohol use disorder, discussed with him that this places him at increased risk for things like CVA, syncope, and even seizures, encouraged to start cutting back on alcohol consumption. Patient with severe protein calorie malnutrition, obvious muscle wasting, appreciate RD assistance during his hospital stay, recommended to increase protein intake with supplements. Patient's symptoms have greatly improved. He is already on daily baby aspirin and statin medication both of which she will continue. Patient given strict return precautions.    Outpatient Encounter Medications as of 02/05/2023  Medication Sig   albuterol (VENTOLIN HFA) 108 (90 Base) MCG/ACT inhaler Inhale 2 puffs into the lungs every 4 (four) hours as needed for wheezing or shortness of breath.   aspirin EC 81 MG tablet  Take 81 mg by mouth daily. Swallow whole.   diclofenac Sodium (VOLTAREN) 1 % GEL Apply to back three times as needed for pain   Ensure (ENSURE) Take 237 mLs by mouth See admin instructions. Strawberry flavor - drink one to two cans by mouth daily   famotidine (PEPCID) 40 MG tablet TAKE ONE TABLET BY MOUTH EVERY MORNING and TAKE ONE TABLET BY MOUTH EVERY EVENING   HYDROcodone-acetaminophen (HYCET) 7.5-325 mg/15 ml solution Take 10 mLs by mouth every 6 (six) hours as needed for moderate pain.   hydrocortisone 2.5 % cream Apply topically 2 (two) times daily. Apply to face (Patient taking differently: Apply 1 application  topically 2 (two) times daily as needed (facial dry skin/itching).)   lansoprazole (PREVACID) 30 MG capsule Take 1 capsule (30 mg total) by mouth 2 (two) times daily before a meal.   levothyroxine (SYNTHROID) 88 MCG tablet TAKE ONE TABLET BY MOUTH BEFORE BREAKFAST Please schedule CPE appt with PCP for further refills   Multiple Vitamin (MULTIVITAMIN WITH MINERALS) TABS tablet Take 1 tablet by mouth daily.   pantoprazole (PROTONIX) 40 MG tablet Take 40 mg by mouth 2 (two) times daily.   protein supplement (RESOURCE BENEPROTEIN) 6 g POWD Take 1 Scoop (6 g total) by mouth 3 (three) times daily with meals.   simvastatin (ZOCOR) 80 MG tablet Take 1 tablet (80 mg total) by mouth at bedtime.   sucralfate (CARAFATE) 1 g tablet TAKE ONE TABLET BY MOUTH FOUR TIMES DAILY Please schedule CPE appt with PCP for further refills   vitamin B-12 (CYANOCOBALAMIN) 1000 MCG tablet Take  1,000 mcg by mouth daily.   No facility-administered encounter medications on file as of 02/05/2023.    Past Medical History:  Diagnosis Date   Anemia    Asthma    Cancer    Carotid stenosis, asymptomatic, right    Esophageal ulcer    Barrett's esophagus   Fall from height of greater than 3 feet 07/16/2016   GERD (gastroesophageal reflux disease)    Hyperlipidemia    Hypothyroidism    Leg cramp 01/04/2021    Stroke    Thyroid disease     Past Surgical History:  Procedure Laterality Date   BIOPSY  06/28/2021   Procedure: BIOPSY;  Surgeon: Lemar Lofty., MD;  Location: [facility];  Service: Gastroenterology;;   BIOPSY  09/05/2021   Procedure: BIOPSY;  Surgeon: Dolores Frame, MD;  [facility];  Service: Gastroenterology;;   BIOPSY  01/09/2022   Procedure: BIOPSY;  Surgeon: Dolores Frame, MD;  [facility];  Service: Gastroenterology;;   ESOPHAGOGASTRODUODENOSCOPY (EGD) WITH PROPOFOL N/A 06/28/2021   Procedure: ESOPHAGOGASTRODUODENOSCOPY (EGD) WITH PROPOFOL;  Surgeon: Lemar Lofty., MD;  Location: [facility];  Service: Gastroenterology;  Laterality: N/A;   ESOPHAGOGASTRODUODENOSCOPY (EGD) WITH PROPOFOL N/A 09/05/2021   Procedure: ESOPHAGOGASTRODUODENOSCOPY (EGD) WITH PROPOFOL;  Surgeon: Dolores Frame, MD;  [facility];  Service: Gastroenterology;  Laterality: N/A;  1:30   ESOPHAGOGASTRODUODENOSCOPY (EGD) WITH PROPOFOL N/A 01/09/2022   Procedure: ESOPHAGOGASTRODUODENOSCOPY (EGD) WITH PROPOFOL;  Surgeon: Dolores Frame, MD;  [facility];  Service: Gastroenterology;  Laterality: N/A;  2:00   FINE NEEDLE ASPIRATION  06/28/2021   Procedure: FINE NEEDLE ASPIRATION (FNA) LINEAR;  Surgeon: Lemar Lofty., MD;  Location: [facility];  Service: Gastroenterology;;   FRACTURE SURGERY     UPPER ESOPHAGEAL ENDOSCOPIC ULTRASOUND (EUS) N/A 06/28/2021   Procedure: UPPER ESOPHAGEAL ENDOSCOPIC ULTRASOUND (EUS);  Surgeon: Lemar Lofty., MD;  Location: [facility];  Service: Gastroenterology;  Laterality: N/A;    Family History  Problem Relation Age of Onset   Leukemia Mother    Heart attack Father     Social History   Socioeconomic History   Marital status: Widowed    Spouse name: Not on file   Number of children: Not on file   Years of education: Not on file   Highest  education level: Not on file  Occupational History   Not on file  Tobacco Use   Smoking status: Former   Smokeless tobacco: Never  Vaping Use   Vaping Use: Never used  Substance and Sexual Activity   Alcohol use: Yes    Comment: occasionally   Drug use: No   Sexual activity: Not on file  Other Topics Concern   Not on file  Social History Narrative   Left handed   Lives in a one story home    Drinks coffee    Social Determinants of Health   Financial Resource Strain: Low Risk  (05/25/2021)   Overall Financial Resource Strain (CARDIA)    Difficulty of Paying Living Expenses: Not hard at all  Food Insecurity: No Food Insecurity (05/25/2021)   Hunger Vital Sign    Worried About Running Out of Food in the Last Year: Never true    Ran Out of Food in the Last Year: Never true  Transportation Needs: No Transportation Needs (05/25/2021)   PRAPARE - Administrator, Civil Service (Medical): No    Lack of Transportation (Non-Medical): No  Physical Activity:  Not on file  Stress: No Stress Concern Present (05/25/2021)   [employer] of Occupational Health - Occupational Stress Questionnaire    Feeling of Stress : Only a little  Social Connections: Socially Isolated (05/25/2021)   Social Connection and Isolation Panel [NHANES]    Frequency of Communication with Friends and Family: More than three times a week    Frequency of Social Gatherings with Friends and Family: More than three times a week    Attends Religious Services: Never    Database administrator or Organizations: No    Attends Banker Meetings: Never    Marital Status: Widowed  Intimate Partner Violence: Not on file    Review of Systems  Constitutional: Negative.   HENT: Negative.    Eyes: Negative.   Respiratory: Negative.    Cardiovascular: Negative.   Gastrointestinal: Negative.   Genitourinary: Negative.   Neurological: Negative.   All other systems reviewed and are negative.        Objective    BP 120/80   Pulse 75   Temp 97.6 F (36.4 C) (Oral)   Ht  (1.626 m)   Wt 96 lb (43.5 kg)   BMI 16.48 kg/m   Physical Exam Vitals and nursing note reviewed.  Constitutional:      Appearance: Normal appearance. He is normal weight.  HENT:     Head: Normocephalic and atraumatic.  Cardiovascular:     Rate and Rhythm: Normal rate and regular rhythm.     Pulses: Normal pulses.     Heart sounds: Normal heart sounds.  Pulmonary:     Effort: Pulmonary effort is normal.     Breath sounds: Normal breath sounds.  Skin:    General: Skin is warm and dry.     Capillary Refill: Capillary refill takes less than 2 seconds.  Neurological:     General: No focal deficit present.     Mental Status: He is alert and oriented to person, place, and time. Mental status is at baseline.     GCS: GCS eye subscore is 4. GCS verbal subscore is 5. GCS motor subscore is 6.  Psychiatric:        Mood and Affect: Mood normal.        Behavior: Behavior normal.        Thought Content: Thought content normal.        Judgment: Judgment normal.         Assessment & Plan:   Problem List Items Addressed This Visit     Hypothyroidism    Chronic. Will check TSH today. He reports he has been taking Synthroid daily.      Relevant Orders   TSH   Macrocytic anemia    CBC today.      Relevant Orders   CBC with Differential/Platelet   Seizure - Primary    Admitted to [facility] 11/05/2022 for AMS due to seizure vs CVA, "CTA head and neck does not reveal any LVO, MRI brain is somewhat equivocal for possible CVA versus artifact, and patient was not able to lie still enough to complete seizure protocol.". Was started on Keppra  BID that he filled one month of after hospitalization, this has not [facility]. No deficits on exam today. Unable to identify a neurology referral in his chart from [facility] outside of being noted he should have close monitoring by  Neurology, will place referral today.      Hypothyroid   Relevant  Orders   TSH   Other Visit Diagnoses     Physical exam, annual       Relevant Orders   COMPLETE METABOLIC PANEL WITH GFR   Lipid panel   Colon cancer screening       Relevant Orders   Cologuard       Return in about 3 months (around 05/07/2023).   Park Meo, FNP

## 2023-02-06 ENCOUNTER — Other Ambulatory Visit: Payer: Self-pay | Admitting: Family Medicine

## 2023-02-06 ENCOUNTER — Other Ambulatory Visit: Payer: Self-pay

## 2023-02-06 ENCOUNTER — Encounter: Payer: Self-pay | Admitting: Oncology

## 2023-02-06 DIAGNOSIS — E039 Hypothyroidism, unspecified: Secondary | ICD-10-CM

## 2023-02-06 MED ORDER — LEVOTHYROXINE SODIUM 75 MCG PO TABS: 75.0000 ug | ORAL_TABLET | Freq: Every day | ORAL | 0 refills | Status: DC

## 2023-02-06 NOTE — Addendum Note (Signed)
Addended by: Park Meo on: 02/06/2023 01:34 PM   Modules accepted: Orders

## 2023-02-07 ENCOUNTER — Encounter: Payer: Self-pay | Admitting: Oncology

## 2023-02-07 LAB — TEST AUTHORIZATION 2

## 2023-02-12 ENCOUNTER — Encounter: Payer: Self-pay | Admitting: Oncology

## 2023-02-12 LAB — COMPLETE METABOLIC PANEL WITH GFR
AG Ratio: 1.3 (calc) (ref 1.0–2.5)
ALT: 9 U/L (ref 9–46)
AST: 23 U/L (ref 10–35)
Albumin: 4 g/dL (ref 3.6–5.1)
Alkaline phosphatase (APISO): 79 U/L (ref 35–144)
BUN: 11 mg/dL (ref 7–25)
CO2: 25 mmol/L (ref 20–32)
Calcium: 9.3 mg/dL (ref 8.6–10.3)
Chloride: 94 mmol/L — ABNORMAL LOW (ref 98–110)
Creat: 0.85 mg/dL (ref 0.70–1.28)
Globulin: 3.2 g/dL (calc) (ref 1.9–3.7)
Glucose, Bld: 99 mg/dL (ref 65–99)
Potassium: 4.8 mmol/L (ref 3.5–5.3)
Sodium: 130 mmol/L — ABNORMAL LOW (ref 135–146)
Total Bilirubin: 0.4 mg/dL (ref 0.2–1.2)
Total Protein: 7.2 g/dL (ref 6.1–8.1)
eGFR: 91 mL/min/{1.73_m2} (ref >=60)

## 2023-02-12 LAB — TEST AUTHORIZATION 2

## 2023-02-12 LAB — LIPID PANEL
Cholesterol: 167 mg/dL (ref <200)
HDL: 89 mg/dL (ref >=40)
LDL Cholesterol (Calc): 64 mg/dL (calc)
Non-HDL Cholesterol (Calc): 78 mg/dL (calc) (ref <130)
Total CHOL/HDL Ratio: 1.9 (calc) (ref <5.0)
Triglycerides: 64 mg/dL (ref <150)

## 2023-02-12 LAB — CBC WITH DIFFERENTIAL/PLATELET
Absolute Monocytes: 632 cells/uL (ref 200–950)
Basophils Absolute: 31 cells/uL (ref 0–200)
Basophils Relative: 0.6 %
Eosinophils Absolute: 71 cells/uL (ref 15–500)
Eosinophils Relative: 1.4 %
HCT: 37.3 % — ABNORMAL LOW (ref 38.5–50.0)
Hemoglobin: 13.2 g/dL (ref 13.2–17.1)
Lymphs Abs: 1046 cells/uL (ref 850–3900)
MCH: 35.2 pg — ABNORMAL HIGH (ref 27.0–33.0)
MCHC: 35.4 g/dL (ref 32.0–36.0)
MCV: 99.5 fL (ref 80.0–100.0)
MPV: 9.5 fL (ref 7.5–12.5)
Monocytes Relative: 12.4 %
Neutro Abs: 3320 cells/uL (ref 1500–7800)
Neutrophils Relative %: 65.1 %
Platelets: 156 10*3/uL (ref 140–400)
RBC: 3.75 10*6/uL — ABNORMAL LOW (ref 4.20–5.80)
RDW: 12.5 % (ref 11.0–15.0)
Total Lymphocyte: 20.5 %
WBC: 5.1 10*3/uL (ref 3.8–10.8)

## 2023-02-12 LAB — TSH: TSH: 0.37 mIU/L — ABNORMAL LOW (ref 0.40–4.50)

## 2023-02-12 LAB — OSMOLALITY

## 2023-02-14 ENCOUNTER — Other Ambulatory Visit: Payer: Self-pay | Admitting: Family Medicine

## 2023-02-14 DIAGNOSIS — E871 Hypo-osmolality and hyponatremia: Secondary | ICD-10-CM

## 2023-02-17 ENCOUNTER — Other Ambulatory Visit: Payer: Self-pay | Admitting: Family Medicine

## 2023-02-19 ENCOUNTER — Other Ambulatory Visit: Payer: Self-pay

## 2023-02-19 ENCOUNTER — Telehealth: Payer: Self-pay | Admitting: Pharmacist

## 2023-02-19 ENCOUNTER — Other Ambulatory Visit: Payer: Medicare HMO

## 2023-02-19 DIAGNOSIS — E871 Hypo-osmolality and hyponatremia: Secondary | ICD-10-CM | POA: Diagnosis not present

## 2023-02-19 NOTE — Progress Notes (Signed)
Care Management & Coordination Services Pharmacy Team   Reason for Encounter: Medication coordination and delivery   Contacted patient to discuss medications and coordinate delivery from [facility]. Spoke with patient on 02/19/2023  Cycle dispensing form sent to Erskine Emery, CPP for review.    Last adherence delivery date: 01/31/23      Patient is due for next adherence delivery on: 03/01/23  This delivery to include: Adherence Packaging  30 Days   lansoprazole  30mg  simvastatin  80mg  famotidine  40mg  levothyroxine  sucralfate 1gm  Patient declined the following medications this month: None  Refills requested from providers include: PCP for 30 day supply levothyroxine  simvastatin  80mg  sucralfate 1gm  Confirmed delivery date of 03/01/23, advised patient that pharmacy will contact them the morning of delivery.   Any concerns about your medications? No  How often do you forget or accidentally miss a dose? Rarely  Do you use a pillbox? No  Is patient in packaging Yes  If yes  What is the date on your next pill pack? 03/04/23  Any concerns or issues with your packaging? No   Medications: Outpatient Encounter Medications as of 02/19/2023  Medication Sig   albuterol (VENTOLIN HFA) 108 (90 Base) MCG/ACT inhaler Inhale 2 puffs into the lungs every 4 (four) hours as needed for wheezing or shortness of breath.   aspirin EC 81 MG tablet Take 81 mg by mouth daily. Swallow whole.   diclofenac Sodium (VOLTAREN) 1 % GEL Apply to back three times as needed for pain   Ensure (ENSURE) Take 237 mLs by mouth See admin instructions. Strawberry flavor - drink one to two cans by mouth daily   famotidine (PEPCID) 40 MG tablet TAKE ONE TABLET BY MOUTH EVERY MORNING and TAKE ONE TABLET BY MOUTH EVERY EVENING   HYDROcodone-acetaminophen (HYCET) 7.5-325 mg/15 ml solution Take 10 mLs by mouth every 6 (six) hours as needed for moderate pain.   hydrocortisone 2.5 % cream Apply  topically 2 (two) times daily. Apply to face (Patient taking differently: Apply 1 application  topically 2 (two) times daily as needed (facial dry skin/itching).)   levothyroxine (SYNTHROID) 75 MCG tablet Take 1 tablet (75 mcg total) by mouth daily before breakfast.   Multiple Vitamin (MULTIVITAMIN WITH MINERALS) TABS tablet Take 1 tablet by mouth daily.   protein supplement (RESOURCE BENEPROTEIN) 6 g POWD Take 1 Scoop (6 g total) by mouth 3 (three) times daily with meals.   simvastatin (ZOCOR) 80 MG tablet Take 1 tablet (80 mg total) by mouth at bedtime.   sucralfate (CARAFATE) 1 g tablet TAKE ONE TABLET BY MOUTH FOUR TIMES DAILY Please schedule CPE appt with PCP for further refills   vitamin B-12 (CYANOCOBALAMIN) 1000 MCG tablet Take 1,000 mcg by mouth daily.   No facility-administered encounter medications on file as of 02/19/2023.   BP Readings from Last 3 Encounters:  02/05/23 120/80  03/09/22 116/70  01/09/22 100/64    Pulse Readings from Last 3 Encounters:  02/05/23 75  01/09/22 63  01/04/22 85    No results found for: "HGBA1C" Lab Results  Component Value Date   CREATININE 0.85 02/05/2023   BUN 11 02/05/2023   GFRNONAA >60 06/26/2021   GFRAA >89 01/14/2015   NA 130 (L) 02/05/2023   K 4.8 02/05/2023   CALCIUM 9.3 02/05/2023   CO2 25 02/05/2023     Future Appointments  Date Time Provider Department Center  03/05/2023 12:50 PM Patel, Roxana Hires K, DO [facility] None  05/07/2023 10:15 AM Donita Brooks, MD [facility]  07/09/2023  9:30 AM [facility] NURSE [facility]    [employer], [address]

## 2023-02-20 ENCOUNTER — Encounter: Payer: Self-pay | Admitting: Oncology

## 2023-02-20 LAB — COMPLETE METABOLIC PANEL WITH GFR
AG Ratio: 1.3 (calc) (ref 1.0–2.5)
ALT: 9 U/L (ref 9–46)
AST: 17 U/L (ref 10–35)
Albumin: 4 g/dL (ref 3.6–5.1)
Alkaline phosphatase (APISO): 64 U/L (ref 35–144)
BUN: 10 mg/dL (ref 7–25)
CO2: 26 mmol/L (ref 20–32)
Calcium: 9.2 mg/dL (ref 8.6–10.3)
Chloride: 97 mmol/L — ABNORMAL LOW (ref 98–110)
Creat: 0.88 mg/dL (ref 0.70–1.28)
Globulin: 3 g/dL (calc) (ref 1.9–3.7)
Glucose, Bld: 96 mg/dL (ref 65–99)
Potassium: 4 mmol/L (ref 3.5–5.3)
Sodium: 133 mmol/L — ABNORMAL LOW (ref 135–146)
Total Bilirubin: 0.6 mg/dL (ref 0.2–1.2)
Total Protein: 7 g/dL (ref 6.1–8.1)
eGFR: 90 mL/min/{1.73_m2} (ref >=60)

## 2023-02-20 LAB — OSMOLALITY, URINE: Osmolality, Ur: 456 mOsm/kg (ref 50–1200)

## 2023-02-20 LAB — COLOGUARD

## 2023-02-20 LAB — OSMOLALITY: Osmolality: 276 mOsm/kg — ABNORMAL LOW (ref 278–305)

## 2023-02-20 LAB — SODIUM, URINE, RANDOM: Sodium, Ur: 88 mmol/L (ref 28–272)

## 2023-02-21 ENCOUNTER — Other Ambulatory Visit: Payer: Self-pay | Admitting: Family Medicine

## 2023-02-22 ENCOUNTER — Other Ambulatory Visit: Payer: Self-pay | Admitting: Family Medicine

## 2023-02-22 DIAGNOSIS — E039 Hypothyroidism, unspecified: Secondary | ICD-10-CM

## 2023-02-22 NOTE — Telephone Encounter (Signed)
Requested Prescriptions  Pending Prescriptions Disp Refills   simvastatin (ZOCOR) 80 MG tablet [Pharmacy Med Name: simvastatin 80 mg tablet] 90 tablet 3    Sig: TAKE ONE TABLET BY MOUTH EVERYDAY AT BEDTIME     Cardiovascular:  Antilipid - Statins Failed - 02/22/2023 11:08 AM      Failed - Lipid Panel in normal range within the last 12 months    Cholesterol  Date Value Ref Range Status  02/05/2023 167 <200 mg/dL Final   LDL Cholesterol (Calc)  Date Value Ref Range Status  02/05/2023 64 mg/dL (calc) Final    Comment:    Reference range: <100 . Desirable range <100 mg/dL for primary prevention;   <70 mg/dL for patients with CHD or diabetic patients  with > or = 2 CHD risk factors. Marland Kitchen LDL-C is now calculated using the Martin-Hopkins  calculation, which is a validated novel method providing  better accuracy than the Friedewald equation in the  estimation of LDL-C.  Horald Pollen et al. Lenox Ahr. 4034;742(59): 2061-2068  (http://education.[facility].com/faq/FAQ164)    HDL  Date Value Ref Range Status  02/05/2023 89 > OR = 40 mg/dL Final   Triglycerides  Date Value Ref Range Status  02/05/2023 64 <150 mg/dL Final         Passed - Patient is not pregnant      Passed - Valid encounter within last 12 months    Recent Outpatient Visits           11 months ago Acquired hypothyroidism   [facility] Medicine Tanya Nones, Priscille Heidelberg, MD   1 year ago Simple chronic bronchitis (HCC)   [facility] Medicine Valentino Nose, NP   1 year ago Simple chronic bronchitis (HCC)   [facility] Medicine Valentino Nose, NP   1 year ago Chest pain, unspecified type   [facility] Medicine Valentino Nose, NP   1 year ago Gastroesophageal reflux disease with esophagitis without hemorrhage   [facility] Medicine Cathlean Marseilles A, NP               sucralfate (CARAFATE) 1 g tablet [Pharmacy Med Name: sucralfate 1 gram tablet] 120 tablet 0     Sig: Take 1 tablet (1 g total) by mouth 4 (four) times daily.     Gastroenterology: Antiacids Passed - 02/22/2023 11:08 AM      Passed - Valid encounter within last 12 months    Recent Outpatient Visits           11 months ago Acquired hypothyroidism   [facility] Medicine Tanya Nones, Priscille Heidelberg, MD   1 year ago Simple chronic bronchitis (HCC)   [facility] Medicine Valentino Nose, NP   1 year ago Simple chronic bronchitis (HCC)   [facility] Medicine Valentino Nose, NP   1 year ago Chest pain, unspecified type   [facility] Medicine Valentino Nose, NP   1 year ago Gastroesophageal reflux disease with esophagitis without hemorrhage   [facility] Medicine Valentino Nose, NP

## 2023-02-27 ENCOUNTER — Other Ambulatory Visit: Payer: Self-pay | Admitting: Family Medicine

## 2023-02-27 ENCOUNTER — Other Ambulatory Visit: Payer: Self-pay

## 2023-02-27 DIAGNOSIS — E039 Hypothyroidism, unspecified: Secondary | ICD-10-CM

## 2023-02-27 MED ORDER — LEVOTHYROXINE SODIUM 75 MCG PO TABS
75.0000 ug | ORAL_TABLET | Freq: Every day | ORAL | 1 refills | Status: DC
Start: 2023-02-27 — End: 2023-06-21

## 2023-03-05 ENCOUNTER — Encounter: Payer: Self-pay | Admitting: Neurology

## 2023-03-05 ENCOUNTER — Ambulatory Visit (INDEPENDENT_AMBULATORY_CARE_PROVIDER_SITE_OTHER): Payer: Medicare HMO | Admitting: Neurology

## 2023-03-05 VITALS — BP 133/78 | HR 96 | Ht 64.0 in | Wt 99.0 lb

## 2023-03-05 DIAGNOSIS — R9089 Other abnormal findings on diagnostic imaging of central nervous system: Secondary | ICD-10-CM

## 2023-03-05 NOTE — Progress Notes (Unsigned)
Follow-up Visit   Date: 03/05/2023    Lee Wilson MRN: [id number] DOB: November 11, 1945    Lee Wilson is a 77 y.o. left-handed Caucasian male with GERD, asthma, hypothyroidism, and alcohol abuse  returning to the clinic for evaluation of seizures.  The patient was accompanied to the clinic by self.  IMPRESSION/PLAN: Episode of loss of consciousness. Hospital notes under Care Everywhere were reviewed and indicate there was concern for seizure vs stroke.  MRI brain was performed however, due to movement artifact, it was unclear whether there was stroke or not.  No focal deficits are present on exam today.   - Repeat MRI brain  - Routine EEG  - Low threshold to restart Keppra, at this point, it's not clear to me whether this event was a seizure or more of a syncopal event   Return to clinic in 2 months  --------------------------------------------- History of present illness:  UPDATE 03/06/2023:  He reports passing out when he was at a friends home. He was hospitalized with altered mental status and concerned about stroke vs seizure.  He denies drinking excess or stopping abruptly. It looks like he was on Keppra 100mg  twice daily, however, it is not on his current medication list that he provides.  MRI was equivocal for stroke.  CTA shows no large vessel occlusion.  He has not had these symptoms recur.  No new complaints.    Medications:  Current Outpatient Medications on File Prior to Visit  Medication Sig Dispense Refill   albuterol (VENTOLIN HFA) 108 (90 Base) MCG/ACT inhaler Inhale 2 puffs into the lungs every 4 (four) hours as needed for wheezing or shortness of breath. 18 g 2   aspirin EC 81 MG tablet Take 81 mg by mouth daily. Swallow whole.     diclofenac Sodium (VOLTAREN) 1 % GEL Apply to back three times as needed for pain 100 g 2   Ensure (ENSURE) Take 237 mLs by mouth See admin instructions. Strawberry flavor - drink one to two cans by mouth daily     famotidine  (PEPCID) 40 MG tablet TAKE ONE TABLET BY MOUTH EVERY MORNING and TAKE ONE TABLET BY MOUTH EVERY EVENING 180 tablet 2   HYDROcodone-acetaminophen (HYCET) 7.5-325 mg/15 ml solution Take 10 mLs by mouth every 6 (six) hours as needed for moderate pain. 240 mL 0   hydrocortisone 2.5 % cream Apply topically 2 (two) times daily. Apply to face (Patient taking differently: Apply 1 application  topically 2 (two) times daily as needed (facial dry skin/itching).) 30 g 2   levothyroxine (SYNTHROID) 75 MCG tablet Take 1 tablet (75 mcg total) by mouth daily before breakfast. 90 tablet 1   Multiple Vitamin (MULTIVITAMIN WITH MINERALS) TABS tablet Take 1 tablet by mouth daily.     protein supplement (RESOURCE BENEPROTEIN) 6 g POWD Take 1 Scoop (6 g total) by mouth 3 (three) times daily with meals.     simvastatin (ZOCOR) 80 MG tablet TAKE ONE TABLET BY MOUTH EVERYDAY AT BEDTIME 90 tablet 3   sucralfate (CARAFATE) 1 g tablet Take 1 tablet (1 g total) by mouth 4 (four) times daily. 120 tablet 0   vitamin B-12 (CYANOCOBALAMIN) 1000 MCG tablet Take 1,000 mcg by mouth daily.     No current facility-administered medications on file prior to visit.    Allergies:  Allergies  Allergen Reactions   Penicillin G Other (See Comments)    Unknown childhood reaction    Vital Signs:  BP 133/78  Pulse 96   Ht 5\' 4"  (1.626 m)   Wt 99 lb (44.9 kg)   BMI 16.99 kg/m   General:  very thin appearing  Neurological Exam: MENTAL STATUS including orientation to time, place, person, recent and remote memory, attention span and concentration, language, and fund of knowledge is normal.  Speech is not dysarthric.  CRANIAL NERVES:  No visual field defects.  Pupils equal round and reactive to light.  Normal conjugate, extra-ocular eye movements in all directions of gaze.  No ptosis.  Face is symmetric. Palate elevates symmetrically.  Tongue is midline.  MOTOR:  Motor strength is 5/5 in all extremities.  No atrophy, fasciculations  or abnormal movements.  No pronator drift.  Tone is normal.    MSRs:  Reflexes are 2+/4 throughout, except 3+/4 at the knees bilaterally.  SENSORY:  Intact to vibration throughout.  COORDINATION/GAIT:  Normal finger-to- nose-finger.  Intact rapid alternating movements bilaterally.  Gait narrow based and stable.   Data: MRI brain wo contrast 11/06/2022: 1. Seizure protocol coronal T2 and T2 FLAIR sequences through the  hippocampi could not be obtained due to limited patient cooperation.  2. 5 mm focus of apparent restricted diffusion along the  mid-to-posterior left frontal lobe (abutting the calvarium), which  may reflect an acute infarct or artifact.  3. 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  4. Paranasal sinus disease, as outlined.  5. Small-volume fluid within the right mastoid air cells.   CTA head and neck 11/06/2022: 1. Negative for large vessel occlusion.   2. Positive for bulky calcified plaque at the Right ICA origin with STRING-SIGN-STENOSIS, but the Right ICA remains patent.   3. Positive also for Severe stenosis of the non-dominant Right Vertebral Artery origin. Diminutive and irregular right V4 segment.  Dominant Left Vertebral Artery supplies the basilar with only mild plaque, stenosis.   4. Moderate bilateral ICA siphon calcified plaque but only mild siphon stenosis.   5. Progressed since PET-CT last year nonspecific subpleural and peribronchial lung disease greater on the right. Underlying emphysema (ICD10-J43.9).   Total time spent reviewing records, interview, history/exam, documentation, and coordination of care on day of encounter:  40 min   Thank you for allowing me to participate in patient's care.  If I can answer any additional questions, I would be pleased to do so.    Sincerely,    Joshwa Hemric K. Allena Katz, DO

## 2023-03-05 NOTE — Patient Instructions (Addendum)
MRI brian wo contrast  Routine EEG  Follow-up in 2 months

## 2023-03-14 ENCOUNTER — Ambulatory Visit: Payer: Medicare HMO | Admitting: Neurology

## 2023-03-14 DIAGNOSIS — R9089 Other abnormal findings on diagnostic imaging of central nervous system: Secondary | ICD-10-CM | POA: Diagnosis not present

## 2023-03-21 ENCOUNTER — Telehealth: Payer: Self-pay | Admitting: Pharmacist

## 2023-03-21 NOTE — Progress Notes (Signed)
Care Management & Coordination Services Pharmacy Team   Reason for Encounter: Medication coordination and delivery   Contacted patient to discuss medications and coordinate delivery from [facility]. Spoke with patient on 03/21/2023  Cycle dispensing form sent to Lee Wilson, CPP for review.   Last adherence delivery date: 03/01/23      Patient is due for next adherence delivery on: 04/02/23  This delivery to include: Adherence Packaging  30 Days   lansoprazole  30mg  simvastatin  80mg  famotidine  40mg  levothyroxine  sucralfate 1gm   Patient declined the following medications this month: None  Refills requested from providers include: Sulcrafate 1gm from specialist Lansoprazole 30mg  from PCP  Confirmed delivery date of 04/02/23, advised patient that pharmacy will contact them the morning of delivery.   Any concerns about your medications? No  How often do you forget or accidentally miss a dose? Rarely  Do you use a pillbox? No  Is patient in packaging Yes  If yes  What is the date on your next pill pack?  Any concerns or issues with your packaging?    Medications: Outpatient Encounter Medications as of 03/21/2023  Medication Sig   albuterol (VENTOLIN HFA) 108 (90 Base) MCG/ACT inhaler Inhale 2 puffs into the lungs every 4 (four) hours as needed for wheezing or shortness of breath.   aspirin EC 81 MG tablet Take 81 mg by mouth daily. Swallow whole.   diclofenac Sodium (VOLTAREN) 1 % GEL Apply to back three times as needed for pain   Ensure (ENSURE) Take 237 mLs by mouth See admin instructions. Strawberry flavor - drink one to two cans by mouth daily   famotidine (PEPCID) 40 MG tablet TAKE ONE TABLET BY MOUTH EVERY MORNING and TAKE ONE TABLET BY MOUTH EVERY EVENING   HYDROcodone-acetaminophen (HYCET) 7.5-325 mg/15 ml solution Take 10 mLs by mouth every 6 (six) hours as needed for moderate pain.   hydrocortisone 2.5 % cream Apply topically 2 (two) times daily.  Apply to face (Patient taking differently: Apply 1 application  topically 2 (two) times daily as needed (facial dry skin/itching).)   levothyroxine (SYNTHROID) 75 MCG tablet Take 1 tablet (75 mcg total) by mouth daily before breakfast.   Multiple Vitamin (MULTIVITAMIN WITH MINERALS) TABS tablet Take 1 tablet by mouth daily.   protein supplement (RESOURCE BENEPROTEIN) 6 g POWD Take 1 Scoop (6 g total) by mouth 3 (three) times daily with meals.   simvastatin (ZOCOR) 80 MG tablet TAKE ONE TABLET BY MOUTH EVERYDAY AT BEDTIME   sucralfate (CARAFATE) 1 g tablet Take 1 tablet (1 g total) by mouth 4 (four) times daily.   vitamin B-12 (CYANOCOBALAMIN) 1000 MCG tablet Take 1,000 mcg by mouth daily.   No facility-administered encounter medications on file as of 03/21/2023.   BP Readings from Last 3 Encounters:  03/05/23 133/78  02/05/23 120/80  03/09/22 116/70    Pulse Readings from Last 3 Encounters:  03/05/23 96  02/05/23 75  01/09/22 63    No results found for: "HGBA1C" Lab Results  Component Value Date   CREATININE 0.88 02/19/2023   BUN 10 02/19/2023   GFRNONAA >60 06/26/2021   GFRAA >89 01/14/2015   NA 133 (L) 02/19/2023   K 4.0 02/19/2023   CALCIUM 9.2 02/19/2023   CO2 26 02/19/2023     Future Appointments  Date Time Provider Department Center  05/07/2023 10:15 AM Donita Brooks, MD [facility]  05/15/2023  8:50 AM Nita Sickle K, DO [facility] None  07/09/2023  9:30 AM [facility] NURSE [facility]     [employer], [facility]

## 2023-03-27 ENCOUNTER — Other Ambulatory Visit: Payer: Self-pay | Admitting: Family Medicine

## 2023-03-27 DIAGNOSIS — K2100 Gastro-esophageal reflux disease with esophagitis, without bleeding: Secondary | ICD-10-CM

## 2023-03-27 NOTE — Procedures (Addendum)
ELECTROENCEPHALOGRAM REPORT  Date of Study: 03/14/2023  Patient's Name: Lee Wilson MRN: [id number] Date of Birth: 07/09/1946  Referring Provider: Dr. Nita Sickle  Clinical History: This is a 77 year old man with episode of loss of consciousness.  Medications: Aspirin, Synthroid, Simvastatin  Technical Summary: A multichannel digital EEG recording measured by the international 10-20 system with electrodes applied with paste and impedances below 5000 ohms performed as portable with EKG monitoring in an awake and drowsy patient.  Hyperventilation was not performed. Photic stimulation was performed.  The digital EEG was referentially recorded, reformatted, and digitally filtered in a variety of bipolar and referential montages for optimal display.   Description: The patient is awake and drowsy during the recording. EEG is limited by muscle artifact obscuring bilateral temporal regions. During maximal wakefulness, there is a symmetric, medium voltage 7 Hz posterior dominant rhythm that attenuates with eye opening. This is admixed with a small amount of diffuse 5-6 Hz theta slowing of the waking background.  During drowsiness, there is an increase in theta slowing of the background. Sleep was not captured. Photic stimulation did not elicit any abnormalities.  There were no epileptiform discharges or electrographic seizures seen in between artifact during drowsy state.  EKG lead showed sinus bradycardia at 60 bpm.  Impression: This limited awake and drowsy EEG is abnormal due to slowing of the posterior dominant rhythm and mild diffuse slowing of the waking background. EEG is limited by movement artifact obscuring bilateral temporal regions.  Clinical Correlation of the above findings indicates diffuse cerebral dysfunction that is non-specific in etiology and can be seen with hypoxic/ischemic injury, toxic/metabolic encephalopathies, neurodegenerative disorders, or medication effect.  The  absence of epileptiform discharges does not rule out a clinical diagnosis of epilepsy.  Clinical correlation is advised.   Patrcia Dolly, M.D.

## 2023-03-28 ENCOUNTER — Other Ambulatory Visit: Payer: Self-pay

## 2023-03-28 ENCOUNTER — Telehealth: Payer: Self-pay

## 2023-03-28 DIAGNOSIS — R4182 Altered mental status, unspecified: Secondary | ICD-10-CM

## 2023-03-28 NOTE — Telephone Encounter (Signed)
[facility] called in to get info about a prescription that was denied for this pt. Chasity pharmacist at [facility] would like a cb from nurse please.  CB: [phone]

## 2023-03-29 ENCOUNTER — Other Ambulatory Visit: Payer: Self-pay | Admitting: Family Medicine

## 2023-03-29 ENCOUNTER — Other Ambulatory Visit: Payer: Self-pay

## 2023-03-29 DIAGNOSIS — K2210 Ulcer of esophagus without bleeding: Secondary | ICD-10-CM

## 2023-03-29 DIAGNOSIS — K2100 Gastro-esophageal reflux disease with esophagitis, without bleeding: Secondary | ICD-10-CM

## 2023-03-29 MED ORDER — LANSOPRAZOLE 30 MG PO CPDR
30.0000 mg | DELAYED_RELEASE_CAPSULE | Freq: Two times a day (BID) | ORAL | 2 refills | Status: DC
Start: 2023-03-29 — End: 2023-06-19

## 2023-03-29 NOTE — Telephone Encounter (Signed)
Unable to refill per protocol, Rx request is too soon. Last refill 03/28/23.  Requested Prescriptions  Pending Prescriptions Disp Refills   lansoprazole (PREVACID) 30 MG capsule [Pharmacy Med Name: lansoprazole 30 mg capsule,delayed release] 60 capsule 6    Sig: TAKE ONE CAPSULE BY MOUTH TWICE DAILY     Gastroenterology: Proton Pump Inhibitors 2 Failed - 03/29/2023 10:22 AM      Failed - Valid encounter within last 12 months    Recent Outpatient Visits           1 year ago Acquired hypothyroidism   [facility] Donita Brooks, MD   1 year ago Simple chronic bronchitis (HCC)   [facility] Medicine Valentino Nose, NP   1 year ago Simple chronic bronchitis (HCC)   [facility] Valentino Nose, NP   1 year ago Chest pain, unspecified type   [facility] Valentino Nose, NP   1 year ago Gastroesophageal reflux disease with esophagitis without hemorrhage   [facility] Valentino Nose, NP              Passed - ALT in normal range and within 360 days    ALT  Date Value Ref Range Status  02/19/2023 9 9 - 46 U/L Final  06/07/2021 9 0 - 44 U/L Final         Passed - AST in normal range and within 360 days    AST  Date Value Ref Range Status  02/19/2023 17 10 - 35 U/L Final  06/07/2021 16 15 - 41 U/L Final

## 2023-04-24 ENCOUNTER — Other Ambulatory Visit: Payer: Self-pay | Admitting: Family Medicine

## 2023-04-24 ENCOUNTER — Ambulatory Visit: Payer: Medicare HMO | Admitting: Neurology

## 2023-04-24 DIAGNOSIS — R4182 Altered mental status, unspecified: Secondary | ICD-10-CM

## 2023-04-25 NOTE — Telephone Encounter (Signed)
Requested Prescriptions  Pending Prescriptions Disp Refills   sucralfate (CARAFATE) 1 g tablet [Pharmacy Med Name: sucralfate 1 gram tablet] 120 tablet 0    Sig: TAKE ONE TABLET BY MOUTH FOUR TIMES DAILY     Gastroenterology: Antiacids Failed - 04/24/2023  4:29 PM      Failed - Valid encounter within last 12 months    Recent Outpatient Visits           1 year ago Acquired hypothyroidism   [facility] Medicine Donita Brooks, MD   1 year ago Simple chronic bronchitis (HCC)   [facility] Valentino Nose, NP   1 year ago Simple chronic bronchitis (HCC)   [facility] Medicine Valentino Nose, NP   1 year ago Chest pain, unspecified type   [facility] Medicine Valentino Nose, NP   2 years ago Gastroesophageal reflux disease with esophagitis without hemorrhage   [facility] Medicine Valentino Nose, NP

## 2023-04-25 NOTE — Progress Notes (Signed)
AMB EEG discontinued. No skin breakdown at unhook.   

## 2023-05-07 ENCOUNTER — Ambulatory Visit: Payer: Medicare HMO | Admitting: Family Medicine

## 2023-05-09 ENCOUNTER — Encounter: Payer: Self-pay | Admitting: Family Medicine

## 2023-05-09 ENCOUNTER — Ambulatory Visit: Payer: Medicare HMO | Admitting: Family Medicine

## 2023-05-09 ENCOUNTER — Ambulatory Visit (INDEPENDENT_AMBULATORY_CARE_PROVIDER_SITE_OTHER): Payer: Medicare HMO | Admitting: Family Medicine

## 2023-05-09 VITALS — BP 120/60 | HR 75 | Temp 97.7°F | Ht 64.0 in | Wt 100.0 lb

## 2023-05-09 DIAGNOSIS — R21 Rash and other nonspecific skin eruption: Secondary | ICD-10-CM | POA: Diagnosis not present

## 2023-05-09 DIAGNOSIS — E039 Hypothyroidism, unspecified: Secondary | ICD-10-CM

## 2023-05-09 DIAGNOSIS — D539 Nutritional anemia, unspecified: Secondary | ICD-10-CM | POA: Diagnosis not present

## 2023-05-09 DIAGNOSIS — E871 Hypo-osmolality and hyponatremia: Secondary | ICD-10-CM | POA: Diagnosis not present

## 2023-05-09 LAB — CBC WITH DIFFERENTIAL/PLATELET
Absolute Monocytes: 676 cells/uL (ref 200–950)
Basophils Absolute: 62 cells/uL (ref 0–200)
Basophils Relative: 1 %
Eosinophils Absolute: 242 cells/uL (ref 15–500)
Eosinophils Relative: 3.9 %
HCT: 36.7 % — ABNORMAL LOW (ref 38.5–50.0)
Hemoglobin: 12.7 g/dL — ABNORMAL LOW (ref 13.2–17.1)
Lymphs Abs: 1872 cells/uL (ref 850–3900)
MCH: 34.6 pg — ABNORMAL HIGH (ref 27.0–33.0)
MCHC: 34.6 g/dL (ref 32.0–36.0)
MCV: 100 fL (ref 80.0–100.0)
MPV: 9.4 fL (ref 7.5–12.5)
Monocytes Relative: 10.9 %
Neutro Abs: 3348 cells/uL (ref 1500–7800)
Neutrophils Relative %: 54 %
Platelets: 147 10*3/uL (ref 140–400)
RBC: 3.67 10*6/uL — ABNORMAL LOW (ref 4.20–5.80)
RDW: 12.6 % (ref 11.0–15.0)
Total Lymphocyte: 30.2 %
WBC: 6.2 10*3/uL (ref 3.8–10.8)

## 2023-05-09 MED ORDER — HYDROCORTISONE 2.5 % EX CREA
TOPICAL_CREAM | Freq: Two times a day (BID) | CUTANEOUS | 2 refills | Status: DC
Start: 2023-05-09 — End: 2023-08-23

## 2023-05-09 NOTE — Assessment & Plan Note (Signed)
Chronic. Compliant with Synthroid daily. TSH today.

## 2023-05-09 NOTE — Progress Notes (Signed)
Subjective:  HPI: Lee Wilson is a 77 y.o. male presenting on 05/09/2023 for Follow-up (3 mos f/u/)   HPI Patient is in today for follow-up for this hypothyroidism.  HYPOTHYROIDISM Thyroid control status:controlled Satisfied with current treatment? yes Medication side effects: no Medication compliance: excellent compliance Etiology of hypothyroidism:  Recent dose adjustment:yes Fatigue: no Cold intolerance: no Heat intolerance: no Weight gain: no Weight loss: no Constipation: yes Diarrhea/loose stools: no Palpitations: no Lower extremity edema: no Anxiety/depressed mood: no   Review of Systems  All other systems reviewed and are negative.   Relevant past medical history reviewed and updated as indicated.   Past Medical History:  Diagnosis Date   Anemia    Asthma    Cancer (HCC)    Carotid stenosis, asymptomatic, right    Esophageal ulcer    Barrett's esophagus   Fall from height of greater than 3 feet 07/16/2016   GERD (gastroesophageal reflux disease)    Hyperlipidemia    Hypothyroidism    Leg cramp 01/04/2021   Stroke [facility])    Thyroid disease      Past Surgical History:  Procedure Laterality Date   BIOPSY  06/28/2021   Procedure: BIOPSY;  Surgeon: Lemar Lofty., MD;  Location: [facility];  Service: Gastroenterology;;   BIOPSY  09/05/2021   Procedure: BIOPSY;  Surgeon: Dolores Frame, MD;  [facility];  Service: Gastroenterology;;   BIOPSY  01/09/2022   Procedure: BIOPSY;  Surgeon: Dolores Frame, MD;  [facility];  Service: Gastroenterology;;   ESOPHAGOGASTRODUODENOSCOPY (EGD) WITH PROPOFOL N/A 06/28/2021   Procedure: ESOPHAGOGASTRODUODENOSCOPY (EGD) WITH PROPOFOL;  Surgeon: Lemar Lofty., MD;  Location: [facility];  Service: Gastroenterology;  Laterality: N/A;   ESOPHAGOGASTRODUODENOSCOPY (EGD) WITH PROPOFOL N/A 09/05/2021   Procedure: ESOPHAGOGASTRODUODENOSCOPY (EGD) WITH  PROPOFOL;  Surgeon: Dolores Frame, MD;  Location: [facility];  Service: Gastroenterology;  Laterality: N/A;  1:30   ESOPHAGOGASTRODUODENOSCOPY (EGD) WITH PROPOFOL N/A 01/09/2022   Procedure: ESOPHAGOGASTRODUODENOSCOPY (EGD) WITH PROPOFOL;  Surgeon: Dolores Frame, MD;  Location: [facility];  Service: Gastroenterology;  Laterality: N/A;  2:00   FINE NEEDLE ASPIRATION  06/28/2021   Procedure: FINE NEEDLE ASPIRATION (FNA) LINEAR;  Surgeon: Lemar Lofty., MD;  Location: [facility];  Service: Gastroenterology;;   FRACTURE SURGERY     UPPER ESOPHAGEAL ENDOSCOPIC ULTRASOUND (EUS) N/A 06/28/2021   Procedure: UPPER ESOPHAGEAL ENDOSCOPIC ULTRASOUND (EUS);  Surgeon: Lemar Lofty., MD;  Location: [facility];  Service: Gastroenterology;  Laterality: N/A;    Allergies and medications reviewed and updated.   Current Outpatient Medications:    albuterol (VENTOLIN HFA) 108 (90 Base) MCG/ACT inhaler, Inhale 2 puffs into the lungs every 4 (four) hours as needed for wheezing or shortness of breath., Disp: 18 g, Rfl: 2   aspirin EC 81 MG tablet, Take 81 mg by mouth daily. Swallow whole., Disp: , Rfl:    diclofenac Sodium (VOLTAREN) 1 % GEL, Apply to back three times as needed for pain, Disp: 100 g, Rfl: 2   Ensure (ENSURE), Take 237 mLs by mouth See admin instructions. Strawberry flavor - drink one to two cans by mouth daily, Disp: , Rfl:    famotidine (PEPCID) 40 MG tablet, TAKE ONE TABLET BY MOUTH EVERY MORNING and TAKE ONE TABLET BY MOUTH EVERY EVENING, Disp: 180 tablet, Rfl: 2   HYDROcodone-acetaminophen (HYCET) 7.5-325 mg/15 ml solution, Take 10 mLs by mouth every 6 (six) hours as needed for moderate pain., Disp: 240  mL, Rfl: 0   lansoprazole (PREVACID) 30 MG capsule, Take 1 capsule (30 mg total) by mouth 2 (two) times daily., Disp: 60 capsule, Rfl: 2   levothyroxine (SYNTHROID) 75 MCG tablet, Take 1 tablet (75 mcg total) by mouth daily before breakfast., Disp:  90 tablet, Rfl: 1   Multiple Vitamin (MULTIVITAMIN WITH MINERALS) TABS tablet, Take 1 tablet by mouth daily., Disp: , Rfl:    protein supplement (RESOURCE BENEPROTEIN) 6 g POWD, Take 1 Scoop (6 g total) by mouth 3 (three) times daily with meals., Disp: , Rfl:    SHINGRIX injection, , Disp: , Rfl:    simvastatin (ZOCOR) 80 MG tablet, TAKE ONE TABLET BY MOUTH EVERYDAY AT BEDTIME, Disp: 90 tablet, Rfl: 3   sucralfate (CARAFATE) 1 g tablet, TAKE ONE TABLET BY MOUTH FOUR TIMES DAILY, Disp: 120 tablet, Rfl: 0   vitamin B-12 (CYANOCOBALAMIN) 1000 MCG tablet, Take 1,000 mcg by mouth daily., Disp: , Rfl:    hydrocortisone 2.5 % cream, Apply topically 2 (two) times daily. Apply to face, Disp: 30 g, Rfl: 2  Allergies  Allergen Reactions   Penicillin G Other (See Comments)    Unknown childhood reaction    Objective:   BP 120/60   Pulse 75   Temp 97.7 F (36.5 C) (Oral)   Ht 5\' 4"  (1.626 m)   Wt 100 lb (45.4 kg)   SpO2 99%   BMI 17.16 kg/m      05/09/2023    4:15 PM 03/05/2023   12:52 PM 02/05/2023    9:34 AM  Vitals with BMI  Height 5\' 4"  5\' 4"  5\' 4"   Weight 100 lbs 99 lbs 96 lbs  BMI 17.16 16.98 16.47  Systolic 120 133 956  Diastolic 60 78 80  Pulse 75 96 75     Physical Exam Vitals and nursing note reviewed.  Constitutional:      Appearance: Normal appearance. He is normal weight.  HENT:     Head: Normocephalic and atraumatic.  Cardiovascular:     Rate and Rhythm: Normal rate and regular rhythm.     Pulses: Normal pulses.     Heart sounds: Normal heart sounds.  Pulmonary:     Effort: Pulmonary effort is normal.     Breath sounds: Normal breath sounds.  Skin:    General: Skin is warm and dry.     Capillary Refill: Capillary refill takes less than 2 seconds.  Neurological:     General: No focal deficit present.     Mental Status: He is alert and oriented to person, place, and time. Mental status is at baseline.  Psychiatric:        Mood and Affect: Mood normal.         Behavior: Behavior normal.        Thought Content: Thought content normal.        Judgment: Judgment normal.     Assessment & Plan:  Acquired hypothyroidism Assessment & Plan: Chronic. Compliant with Synthroid daily. TSH today.  Orders: -     TSH  Rash -     Hydrocortisone; Apply topically 2 (two) times daily. Apply to face  Dispense: 30 g; Refill: 2  Macrocytic anemia -     CBC with Differential/Platelet  Hyponatremia -     COMPLETE METABOLIC PANEL WITH GFR     Follow up plan: Return in about 3 months (around 08/09/2023) for chronic follow-up with labs 1 week prior.  Park Meo, FNP

## 2023-05-10 ENCOUNTER — Other Ambulatory Visit: Payer: Self-pay

## 2023-05-10 ENCOUNTER — Encounter: Payer: Self-pay | Admitting: Oncology

## 2023-05-10 LAB — COMPLETE METABOLIC PANEL WITH GFR: Glucose, Bld: 71 mg/dL (ref 65–99)

## 2023-05-15 ENCOUNTER — Ambulatory Visit: Payer: Medicare HMO | Admitting: Neurology

## 2023-05-15 ENCOUNTER — Other Ambulatory Visit (INDEPENDENT_AMBULATORY_CARE_PROVIDER_SITE_OTHER): Payer: Self-pay | Admitting: Gastroenterology

## 2023-05-21 ENCOUNTER — Ambulatory Visit: Payer: Medicare HMO | Admitting: Neurology

## 2023-05-27 ENCOUNTER — Other Ambulatory Visit (INDEPENDENT_AMBULATORY_CARE_PROVIDER_SITE_OTHER): Payer: Self-pay | Admitting: Gastroenterology

## 2023-05-27 ENCOUNTER — Other Ambulatory Visit: Payer: Self-pay | Admitting: Family Medicine

## 2023-05-27 NOTE — Telephone Encounter (Signed)
Needs office visit.

## 2023-05-29 NOTE — Telephone Encounter (Signed)
Last OV 05/09/23, within protocol.  Requested Prescriptions  Pending Prescriptions Disp Refills   sucralfate (CARAFATE) 1 g tablet [Pharmacy Med Name: sucralfate 1 gram tablet] 360 tablet 0    Sig: TAKE ONE TABLET BY MOUTH FOUR TIMES DAILY     Gastroenterology: Antiacids Failed - 05/27/2023 12:18 PM      Failed - Valid encounter within last 12 months    Recent Outpatient Visits           1 year ago Acquired hypothyroidism   [facility] Donita Brooks, MD   1 year ago Simple chronic bronchitis (HCC)   [facility] Medicine Valentino Nose, NP   1 year ago Simple chronic bronchitis (HCC)   [facility] Medicine Valentino Nose, NP   2 years ago Chest pain, unspecified type   [facility] Medicine Valentino Nose, NP   2 years ago Gastroesophageal reflux disease with esophagitis without hemorrhage   [facility] Valentino Nose, NP       Future Appointments             In 2 months Park Meo, FNP [facility], [facility]

## 2023-05-31 IMAGING — CT NM PET TUM IMG RESTAG (PS) SKULL BASE T - THIGH
1 of 7 series · 1 of 25 positions shown · non-contrast
Comparison: CT scan 04/24/2021

CLINICAL DATA: Initial treatment strategy for esophageal cancer.

EXAM:
NUCLEAR MEDICINE PET SKULL BASE TO THIGH
TECHNIQUE: 4.97 mCi F-18 FDG was injected intravenously. Full-ring PET imaging
was performed from the skull base to thigh after the radiotracer. CT
data was obtained and used for attenuation correction and anatomic
localization.
Fasting blood glucose: 105 mg/dl

[Series 4: ct sk_thigh 5.0 bf37 · axial · 5.0mm · 0.98mm/px · 1 of 213 slices shown]
[im 213/213  brain]
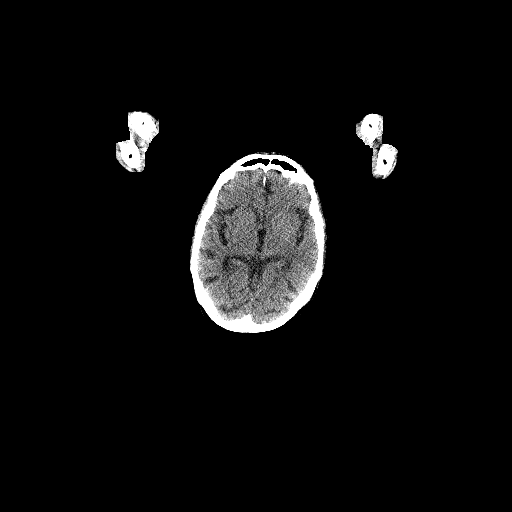

[1 of 25 positions shown; findings below may reference images not displayed]

FINDINGS: Mediastinal blood pool activity: SUV max

Liver activity: SUV max NA

NECK: No hypermetabolic lymph nodes in the neck.

Incidental CT findings: none

CHEST: The mid distal esophageal mass is hypermetabolic with SUV max
of 6.11. This does not involve the distal esophagus or GE junction.
6 mm paraesophageal/subcarinal lymph node on image 72/4 is mildly
hypermetabolic with SUV max of 3.21. There are few small scattered
paratracheal and AP window nodes but no definite hypermetabolism. No
worrisome pulmonary lesions to suggest pulmonary metastatic disease
on the CT scan.

Incidental CT findings:

Chronic appearing emphysematous and bronchitic lung changes.
Scattered aortic and coronary artery calcifications.

No chest wall mass, supraclavicular or axillary adenopathy.

ABDOMEN/PELVIS: No findings suspicious for abdominal/pelvic
metastatic disease. No enlarged or hypermetabolic gastrohepatic
ligament or celiac axis lymph nodes. No hepatic or adrenal gland
lesions.

Incidental CT findings: Minimal scattered aortic calcifications. No
aneurysm.

SKELETON: No findings suspicious for osseous metastatic disease.
Subtle area of hypermetabolism in the right scapula or possibly
between the scapula and the ribs. No bony or soft tissue abnormality
is identified on the CT scan and this is most likely inflammatory.

Incidental CT findings: none
IMPRESSION: 1. Mid to lower esophageal mass is hypermetabolic and consistent
with known esophageal cancer. No involvement of the distal esophagus
or GE junction.
2. Small subcarinal lymph node with borderline FDG uptake. Attention
on future studies suggested. No other findings for locoregional
adenopathy or metastatic disease.

## 2023-06-08 NOTE — Procedures (Signed)
ELECTROENCEPHALOGRAM REPORT  Dates of Recording: 04/24/2023 10:24AM to 04/25/2023 11:03AM  Patient's Name: Lee Wilson MRN: [id number] Date of Birth: December 21, 1945  Referring Provider: Dr. Nita Sickle  Procedure: 24-hour ambulatory video EEG  History:  This is a 78 year old man with episode of loss of consciousness.   Medications: Aspirin, Synthroid, Simvastatin  Technical Summary: This is a 24-hour multichannel digital EEG recording measured by the international 10-20 system with electrodes applied with paste and impedances below 5000 ohms performed as portable with EKG monitoring.  The digital EEG was referentially recorded, reformatted, and digitally filtered in a variety of bipolar and referential montages for optimal display.    DESCRIPTION OF RECORDING: During maximal wakefulness, the background activity consisted of a symmetric 7 Hz posterior dominant rhythm which was reactive to eye opening. There is mild diffuse background slowing seen. There were no epileptiform discharges or focal slowing seen in wakefulness.  During the recording, the patient progresses through wakefulness, drowsiness, and Stage 2 sleep.  Again, there were no epileptiform discharges seen.  Events: There were 6 push button events on 7/10 at 1335, 1340, 1505, 1522, 1535 and 7/11 at 0956 hours. No video recorded. Electrographically, there were no EEG changes seen. Technical difficulties with EKG lead.  There were no electrographic seizures seen.  EKG lead was unremarkable.  IMPRESSION: This 24-hour ambulatory EEG study is mildly abnormal due to the slowing of the posterior dominant rhythm and mild diffuse slowing of the waking background.   Clinical Correlation of the above findings indicates mild diffuse cerebral dysfunction that is non-specific in etiology and can be seen with hypoxic/ischemic injury, toxic/metabolic encephalopathies, neurodegenerative disorders, or medication effect.  The absence of  epileptiform discharges does not rule out a clinical diagnosis of epilepsy. If further clinical questions remain, inpatient video EEG monitoring may be helpful. Clinical correlation is advised   Patrcia Dolly, M.D.

## 2023-06-18 ENCOUNTER — Other Ambulatory Visit: Payer: Self-pay | Admitting: Family Medicine

## 2023-06-18 ENCOUNTER — Other Ambulatory Visit (INDEPENDENT_AMBULATORY_CARE_PROVIDER_SITE_OTHER): Payer: Self-pay | Admitting: Gastroenterology

## 2023-06-18 DIAGNOSIS — K2100 Gastro-esophageal reflux disease with esophagitis, without bleeding: Secondary | ICD-10-CM

## 2023-06-18 DIAGNOSIS — K2210 Ulcer of esophagus without bleeding: Secondary | ICD-10-CM

## 2023-06-18 DIAGNOSIS — E039 Hypothyroidism, unspecified: Secondary | ICD-10-CM

## 2023-06-18 NOTE — Telephone Encounter (Signed)
November 14,2022 last seen. Needs office visit for refills.

## 2023-06-19 ENCOUNTER — Other Ambulatory Visit: Payer: Self-pay | Admitting: Family Medicine

## 2023-06-19 NOTE — Telephone Encounter (Signed)
Requested Prescriptions  Pending Prescriptions Disp Refills   lansoprazole (PREVACID) 30 MG capsule [Pharmacy Med Name: lansoprazole 30 mg capsule,delayed release] 180 capsule 0    Sig: TAKE ONE CAPSULE BY MOUTH TWICE DAILY     Gastroenterology: Proton Pump Inhibitors 2 Failed - 06/18/2023  8:03 AM      Failed - Valid encounter within last 12 months    Recent Outpatient Visits           1 year ago Acquired hypothyroidism   [facility] Donita Brooks, MD   1 year ago Simple chronic bronchitis (HCC)   [facility] Valentino Nose, NP   2 years ago Simple chronic bronchitis (HCC)   [facility] Valentino Nose, NP   2 years ago Chest pain, unspecified type   [facility] Cathlean Marseilles A, NP   2 years ago Gastroesophageal reflux disease with esophagitis without hemorrhage   [facility] Valentino Nose, NP       Future Appointments             In 1 month Park Meo, FNP [place] [facility]            Passed - ALT in normal range and within 360 days    ALT  Date Value Ref Range Status  05/09/2023 10 9 - 46 U/L Final  06/07/2021 9 0 - 44 U/L Final         Passed - AST in normal range and within 360 days    AST  Date Value Ref Range Status  05/09/2023 18 10 - 35 U/L Final  06/07/2021 16 15 - 41 U/L Final

## 2023-06-20 ENCOUNTER — Telehealth (INDEPENDENT_AMBULATORY_CARE_PROVIDER_SITE_OTHER): Payer: Self-pay | Admitting: *Deleted

## 2023-06-20 NOTE — Telephone Encounter (Signed)
Sam from [facility] called to get refill on famotidine 40mg . She said rx had been denied when sent over and wanted clarification.  Pt last seen in office 09/05/21 and had egd 01/09/22. Med was denied since its been over one year since being seen. [facility] notified cannot refill since over due for an appt.   Mitzie, will you call and schedule pt since I do not see upcoming appt so patient can get refills. Thanks.

## 2023-06-20 NOTE — Telephone Encounter (Signed)
Lansoprazole: 06/19/23 #180 Sucralfate: 05/29/23 #360 Levothyroxine: 02/27/23 #90 1 RF Simvastatin: 02/22/23 #90 3 RF  Requested Prescriptions  Refused Prescriptions Disp Refills   lansoprazole (PREVACID) 30 MG capsule [Pharmacy Med Name: lansoprazole 30 mg capsule,delayed release] 60 capsule 5    Sig: TAKE ONE CAPSULE BY MOUTH TWICE DAILY     Gastroenterology: Proton Pump Inhibitors 2 Failed - 06/18/2023  1:10 PM      Failed - Valid encounter within last 12 months    Recent Outpatient Visits           1 year ago Acquired hypothyroidism   [facility] Donita Brooks, MD   1 year ago Simple chronic bronchitis (HCC)   [facility] Valentino Nose, NP   2 years ago Simple chronic bronchitis (HCC)   [facility] Valentino Nose, NP   2 years ago Chest pain, unspecified type   [facility] Valentino Nose, NP   2 years ago Gastroesophageal reflux disease with esophagitis without hemorrhage   [facility] Valentino Nose, NP       Future Appointments             In 1 month Dimas Aguas, Binnie Rail, FNP [place] [facility]            Passed - ALT in normal range and within 360 days    ALT  Date Value Ref Range Status  05/09/2023 10 9 - 46 U/L Final  06/07/2021 9 0 - 44 U/L Final         Passed - AST in normal range and within 360 days    AST  Date Value Ref Range Status  05/09/2023 18 10 - 35 U/L Final  06/07/2021 16 15 - 41 U/L Final          sucralfate (CARAFATE) 1 g tablet [Pharmacy Med Name: sucralfate 1 gram tablet] 120 tablet 5    Sig: TAKE 1 TABLET BY MOUTH FOUR TIMES DAILY     Gastroenterology: Antiacids Failed - 06/18/2023  1:10 PM      Failed - Valid encounter within last 12 months    Recent Outpatient Visits           1 year ago Acquired hypothyroidism   [facility] Donita Brooks, MD   1 year ago Simple chronic bronchitis  (HCC)   [facility] Valentino Nose, NP   2 years ago Simple chronic bronchitis (HCC)   [facility] Valentino Nose, NP   2 years ago Chest pain, unspecified type   [facility] Cathlean Marseilles A, NP   2 years ago Gastroesophageal reflux disease with esophagitis without hemorrhage   [facility] Valentino Nose, NP       Future Appointments             In 1 month Dimas Aguas, Binnie Rail, FNP [place] [facility]             levothyroxine (SYNTHROID) 75 MCG tablet [Pharmacy Med Name: levothyroxine 75 mcg tablet] 30 tablet 5    Sig: TAKE 1 TABLET BY MOUTH DAILY BEFORE BREAKFAST     Endocrinology:  Hypothyroid Agents Failed - 06/18/2023  1:10 PM      Failed - Valid encounter within last 12 months    Recent Outpatient Visits  1 year ago Acquired hypothyroidism   [facility] Tanya Nones, Priscille Heidelberg, MD   1 year ago Simple chronic bronchitis (HCC)   [facility] Valentino Nose, NP   2 years ago Simple chronic bronchitis (HCC)   [facility] Valentino Nose, NP   2 years ago Chest pain, unspecified type   [facility] Cathlean Marseilles A, NP   2 years ago Gastroesophageal reflux disease with esophagitis without hemorrhage   [facility] Medicine Valentino Nose, NP       Future Appointments             In 1 month Dimas Aguas, Binnie Rail, FNP [place] [facility]            Passed - TSH in normal range and within 360 days    TSH  Date Value Ref Range Status  05/09/2023 1.82 0.40 - 4.50 mIU/L Final          simvastatin (ZOCOR) 80 MG tablet [Pharmacy Med Name: simvastatin 80 mg tablet] 30 tablet 5    Sig: TAKE 1 TABLET BY MOUTH AT BEDTIME     Cardiovascular:  Antilipid - Statins Failed - 06/18/2023  1:10 PM      Failed - Valid encounter within last 12 months    Recent  Outpatient Visits           1 year ago Acquired hypothyroidism   [facility] Donita Brooks, MD   1 year ago Simple chronic bronchitis (HCC)   [facility] Valentino Nose, NP   2 years ago Simple chronic bronchitis (HCC)   [facility] Valentino Nose, NP   2 years ago Chest pain, unspecified type   [facility] Medicine Cathlean Marseilles A, NP   2 years ago Gastroesophageal reflux disease with esophagitis without hemorrhage   [facility] Valentino Nose, NP       Future Appointments             In 1 month Park Meo, FNP [place] [facility]            Failed - Lipid Panel in normal range within the last 12 months    Cholesterol  Date Value Ref Range Status  02/05/2023 167 <200 mg/dL Final   LDL Cholesterol (Calc)  Date Value Ref Range Status  02/05/2023 64 mg/dL (calc) Final    Comment:    Reference range: <100 . Desirable range <100 mg/dL for primary prevention;   <70 mg/dL for patients with CHD or diabetic patients  with > or = 2 CHD risk factors. Marland Kitchen LDL-C is now calculated using the Martin-Hopkins  calculation, which is a validated novel method providing  better accuracy than the Friedewald equation in the  estimation of LDL-C.  Horald Pollen et al. Lenox Ahr. 1610;960(45): 2061-2068  (http://education.[employer].com/faq/FAQ164)    HDL  Date Value Ref Range Status  02/05/2023 89 > OR = 40 mg/dL Final   Triglycerides  Date Value Ref Range Status  02/05/2023 64 <150 mg/dL Final         Passed - Patient is not pregnant

## 2023-06-21 ENCOUNTER — Telehealth: Payer: Self-pay | Admitting: Family Medicine

## 2023-06-21 ENCOUNTER — Other Ambulatory Visit: Payer: Self-pay | Admitting: Family Medicine

## 2023-06-21 DIAGNOSIS — E039 Hypothyroidism, unspecified: Secondary | ICD-10-CM

## 2023-06-21 MED ORDER — SIMVASTATIN 80 MG PO TABS: 80.0000 mg | ORAL_TABLET | Freq: Every day | ORAL | 1 refills | Status: DC

## 2023-06-21 MED ORDER — LEVOTHYROXINE SODIUM 75 MCG PO TABS: 75.0000 ug | ORAL_TABLET | Freq: Every day | ORAL | 1 refills | Status: DC

## 2023-06-21 NOTE — Telephone Encounter (Signed)
Prescription Request  06/21/2023  LOV: 05/09/2023  What is the name of the medication or equipment? levothyroxine (SYNTHROID) 75 MCG tablet   Have you contacted your pharmacy to request a refill? Yes   Which pharmacy would you like this sent to?  [facility] [place], [place] - [address] [place] [address] Phone: [phone] Fax: [phone]    Patient notified that their request is being sent to the clinical staff for review and that they should receive a response within 2 business days.   Please advise at [facility] [phone]

## 2023-06-21 NOTE — Addendum Note (Signed)
Addended by: Arta Silence on: 06/21/2023 10:36 AM   Modules accepted: Orders

## 2023-06-21 NOTE — Telephone Encounter (Signed)
Prescription Request  06/21/2023  LOV: 05/09/2023  What is the name of the medication or equipment? sucralfate (CARAFATE) 1 g table   Have you contacted your pharmacy to request a refill? Yes   Which pharmacy would you like this sent to?  [facility] [place], [place] - [address] [place] [address] Phone: [phone] Fax: [phone]    Patient notified that their request is being sent to the clinical staff for review and that they should receive a response within 2 business days.   Please advise at [facility] [phone]

## 2023-06-21 NOTE — Telephone Encounter (Signed)
Requested Prescriptions  Pending Prescriptions Disp Refills   famotidine (PEPCID) 40 MG tablet 180 tablet 2     Gastroenterology:  H2 Antagonists Failed - 06/21/2023  4:30 PM      Failed - Valid encounter within last 12 months    Recent Outpatient Visits           1 year ago Acquired hypothyroidism   [facility] Tanya Nones, Priscille Heidelberg, MD   1 year ago Simple chronic bronchitis (HCC)   [facility] Valentino Nose, NP   2 years ago Simple chronic bronchitis (HCC)   [facility] Valentino Nose, NP   2 years ago Chest pain, unspecified type   [facility] Valentino Nose, NP   2 years ago Gastroesophageal reflux disease with esophagitis without hemorrhage   [facility] Medicine Valentino Nose, NP       Future Appointments             In 1 month Park Meo, FNP [place] [facility]             simvastatin (ZOCOR) 80 MG tablet 90 tablet 1    Sig: Take 1 tablet (80 mg total) by mouth at bedtime.     Cardiovascular:  Antilipid - Statins Failed - 06/21/2023  4:30 PM      Failed - Valid encounter within last 12 months    Recent Outpatient Visits           1 year ago Acquired hypothyroidism   [facility] Tanya Nones, Priscille Heidelberg, MD   1 year ago Simple chronic bronchitis (HCC)   [facility] Valentino Nose, NP   2 years ago Simple chronic bronchitis (HCC)   [facility] Valentino Nose, NP   2 years ago Chest pain, unspecified type   [facility] Medicine Valentino Nose, NP   2 years ago Gastroesophageal reflux disease with esophagitis without hemorrhage   [facility] Medicine Valentino Nose, NP       Future Appointments             In 1 month Park Meo, FNP [place] [facility]            Failed - Lipid Panel in normal range within the last 12  months    Cholesterol  Date Value Ref Range Status  02/05/2023 167 <200 mg/dL Final   LDL Cholesterol (Calc)  Date Value Ref Range Status  02/05/2023 64 mg/dL (calc) Final    Comment:    Reference range: <100 . Desirable range <100 mg/dL for primary prevention;   <70 mg/dL for patients with CHD or diabetic patients  with > or = 2 CHD risk factors. Marland Kitchen LDL-C is now calculated using the Martin-Hopkins  calculation, which is a validated novel method providing  better accuracy than the Friedewald equation in the  estimation of LDL-C.  Horald Pollen et al. Lenox Ahr. 4098;119(14): 2061-2068  (http://education.[facility].com/faq/FAQ164)    HDL  Date Value Ref Range Status  02/05/2023 89 > OR = 40 mg/dL Final   Triglycerides  Date Value Ref Range Status  02/05/2023 64 <150 mg/dL Final         Passed - Patient is not pregnant

## 2023-06-21 NOTE — Telephone Encounter (Signed)
Requested medications are due for refill today.  no  Requested medications are on the active medications list.  yes  Last refill. 08/21/2022 #180 2 rf  Future visit scheduled.   yes  Notes to clinic.  Pt wants rx sent to a different pharmacy. Rx signed by a different provider.    Requested Prescriptions  Pending Prescriptions Disp Refills   famotidine (PEPCID) 40 MG tablet 180 tablet 2     Gastroenterology:  H2 Antagonists Failed - 06/21/2023  4:30 PM      Failed - Valid encounter within last 12 months    Recent Outpatient Visits           1 year ago Acquired hypothyroidism   [facility] Tanya Nones, Priscille Heidelberg, MD   1 year ago Simple chronic bronchitis (HCC)   [facility] Valentino Nose, NP   2 years ago Simple chronic bronchitis (HCC)   [facility] Valentino Nose, NP   2 years ago Chest pain, unspecified type   [facility] Medicine Valentino Nose, NP   2 years ago Gastroesophageal reflux disease with esophagitis without hemorrhage   [facility] Valentino Nose, NP       Future Appointments             In 1 month Park Meo, FNP [place] [facility]            Signed Prescriptions Disp Refills   simvastatin (ZOCOR) 80 MG tablet 90 tablet 1    Sig: Take 1 tablet (80 mg total) by mouth at bedtime.     Cardiovascular:  Antilipid - Statins Failed - 06/21/2023  4:30 PM      Failed - Valid encounter within last 12 months    Recent Outpatient Visits           1 year ago Acquired hypothyroidism   [facility] Medicine Tanya Nones, Priscille Heidelberg, MD   1 year ago Simple chronic bronchitis (HCC)   [facility] Medicine Valentino Nose, NP   2 years ago Simple chronic bronchitis (HCC)   [facility] Valentino Nose, NP   2 years ago Chest pain, unspecified type   [facility] Medicine Valentino Nose, NP   2 years  ago Gastroesophageal reflux disease with esophagitis without hemorrhage   [facility] Medicine Valentino Nose, NP       Future Appointments             In 1 month Park Meo, FNP [place] [facility]            Failed - Lipid Panel in normal range within the last 12 months    Cholesterol  Date Value Ref Range Status  02/05/2023 167 <200 mg/dL Final   LDL Cholesterol (Calc)  Date Value Ref Range Status  02/05/2023 64 mg/dL (calc) Final    Comment:    Reference range: <100 . Desirable range <100 mg/dL for primary prevention;   <70 mg/dL for patients with CHD or diabetic patients  with > or = 2 CHD risk factors. Marland Kitchen LDL-C is now calculated using the Martin-Hopkins  calculation, which is a validated novel method providing  better accuracy than the Friedewald equation in the  estimation of LDL-C.  Horald Pollen et al. Lenox Ahr. 7062;376(28): 2061-2068  (http://education.[facility].com/faq/FAQ164)    HDL  Date Value Ref Range Status  02/05/2023 89 > OR = 40 mg/dL Final   Triglycerides  Date Value Ref Range Status  02/05/2023 64 <150 mg/dL Final         Passed - Patient is not pregnant

## 2023-06-21 NOTE — Telephone Encounter (Signed)
Prescription Request  06/21/2023  LOV: 05/09/2023  What is the name of the medication or equipment? simvastatin (ZOCOR) 80 MG tablet   Have you contacted your pharmacy to request a refill? Yes   Which pharmacy would you like this sent to?  [facility] [place], [place] - [address] [place] [address] Phone: [phone] Fax: [phone]    Patient notified that their request is being sent to the clinical staff for review and that they should receive a response within 2 business days.   Please advise at [facility] [phone]

## 2023-06-21 NOTE — Telephone Encounter (Signed)
Prescription Request  06/21/2023  LOV: 05/09/2023  What is the name of the medication or equipment? famotidine (PEPCID) 40 MG tablet   Have you contacted your pharmacy to request a refill? Yes   Which pharmacy would you like this sent to?  [facility] [place], [place] - [address] [place] [address] Phone: [phone] Fax: [phone]    Patient notified that their request is being sent to the clinical staff for review and that they should receive a response within 2 business days.   Please advise at [facility] [phone]

## 2023-07-04 ENCOUNTER — Ambulatory Visit: Payer: Medicare HMO

## 2023-07-09 ENCOUNTER — Ambulatory Visit: Payer: Medicare HMO

## 2023-07-10 ENCOUNTER — Ambulatory Visit: Payer: Medicare HMO | Admitting: Neurology

## 2023-07-10 MED ORDER — FAMOTIDINE 40 MG PO TABS: 40.0000 mg | ORAL_TABLET | Freq: Every day | ORAL | 1 refills | Status: DC

## 2023-07-11 ENCOUNTER — Ambulatory Visit: Payer: Medicare HMO

## 2023-07-11 DIAGNOSIS — R634 Abnormal weight loss: Secondary | ICD-10-CM | POA: Insufficient documentation

## 2023-07-11 DIAGNOSIS — F1010 Alcohol abuse, uncomplicated: Secondary | ICD-10-CM | POA: Insufficient documentation

## 2023-07-11 DIAGNOSIS — K2100 Gastro-esophageal reflux disease with esophagitis, without bleeding: Secondary | ICD-10-CM | POA: Insufficient documentation

## 2023-07-11 DIAGNOSIS — K2990 Gastroduodenitis, unspecified, without bleeding: Secondary | ICD-10-CM | POA: Insufficient documentation

## 2023-07-11 DIAGNOSIS — E876 Hypokalemia: Secondary | ICD-10-CM | POA: Insufficient documentation

## 2023-07-11 DIAGNOSIS — Z23 Encounter for immunization: Secondary | ICD-10-CM

## 2023-07-11 DIAGNOSIS — R1110 Vomiting, unspecified: Secondary | ICD-10-CM | POA: Insufficient documentation

## 2023-08-05 ENCOUNTER — Other Ambulatory Visit: Payer: Medicare HMO

## 2023-08-05 DIAGNOSIS — Z13220 Encounter for screening for lipoid disorders: Secondary | ICD-10-CM | POA: Diagnosis not present

## 2023-08-05 DIAGNOSIS — E871 Hypo-osmolality and hyponatremia: Secondary | ICD-10-CM

## 2023-08-05 DIAGNOSIS — E039 Hypothyroidism, unspecified: Secondary | ICD-10-CM

## 2023-08-05 DIAGNOSIS — E43 Unspecified severe protein-calorie malnutrition: Secondary | ICD-10-CM

## 2023-08-05 DIAGNOSIS — R1110 Vomiting, unspecified: Secondary | ICD-10-CM

## 2023-08-05 DIAGNOSIS — F1010 Alcohol abuse, uncomplicated: Secondary | ICD-10-CM

## 2023-08-06 ENCOUNTER — Encounter: Payer: Self-pay | Admitting: Oncology

## 2023-08-06 LAB — CBC WITH DIFFERENTIAL/PLATELET
Absolute Lymphocytes: 1711 {cells}/uL (ref 850–3900)
Absolute Monocytes: 572 {cells}/uL (ref 200–950)
Basophils Absolute: 62 {cells}/uL (ref 0–200)
Basophils Relative: 1.2 %
Eosinophils Absolute: 213 {cells}/uL (ref 15–500)
Eosinophils Relative: 4.1 %
HCT: 40.5 % (ref 38.5–50.0)
Hemoglobin: 13.5 g/dL (ref 13.2–17.1)
MCH: 33.3 pg — ABNORMAL HIGH (ref 27.0–33.0)
MCHC: 33.3 g/dL (ref 32.0–36.0)
MCV: 100 fL (ref 80.0–100.0)
MPV: 9.7 fL (ref 7.5–12.5)
Monocytes Relative: 11 %
Neutro Abs: 2642 {cells}/uL (ref 1500–7800)
Neutrophils Relative %: 50.8 %
Platelets: 184 10*3/uL (ref 140–400)
RBC: 4.05 10*6/uL — ABNORMAL LOW (ref 4.20–5.80)
RDW: 12.7 % (ref 11.0–15.0)
Total Lymphocyte: 32.9 %
WBC: 5.2 10*3/uL (ref 3.8–10.8)

## 2023-08-06 LAB — COMPLETE METABOLIC PANEL WITH GFR
AG Ratio: 1.3 (calc) (ref 1.0–2.5)
ALT: 9 U/L (ref 9–46)
AST: 22 U/L (ref 10–35)
Albumin: 4.1 g/dL (ref 3.6–5.1)
Alkaline phosphatase (APISO): 55 U/L (ref 35–144)
BUN: 11 mg/dL (ref 7–25)
CO2: 26 mmol/L (ref 20–32)
Calcium: 9.8 mg/dL (ref 8.6–10.3)
Chloride: 94 mmol/L — ABNORMAL LOW (ref 98–110)
Creat: 1.09 mg/dL (ref 0.70–1.28)
Globulin: 3.2 g/dL (ref 1.9–3.7)
Glucose, Bld: 100 mg/dL — ABNORMAL HIGH (ref 65–99)
Potassium: 4.5 mmol/L (ref 3.5–5.3)
Sodium: 131 mmol/L — ABNORMAL LOW (ref 135–146)
Total Bilirubin: 0.7 mg/dL (ref 0.2–1.2)
Total Protein: 7.3 g/dL (ref 6.1–8.1)
eGFR: 71 mL/min/{1.73_m2} (ref >=60)

## 2023-08-06 LAB — TSH: TSH: 15.67 m[IU]/L — ABNORMAL HIGH (ref 0.40–4.50)

## 2023-08-06 LAB — LIPID PANEL
Cholesterol: 201 mg/dL — ABNORMAL HIGH (ref <200)
HDL: 78 mg/dL (ref >=40)
LDL Cholesterol (Calc): 107 mg/dL — ABNORMAL HIGH
Non-HDL Cholesterol (Calc): 123 mg/dL (ref <130)
Total CHOL/HDL Ratio: 2.6 (calc) (ref <5.0)
Triglycerides: 70 mg/dL (ref <150)

## 2023-08-12 ENCOUNTER — Ambulatory Visit: Payer: Medicare HMO | Admitting: Family Medicine

## 2023-08-13 ENCOUNTER — Ambulatory Visit: Payer: Medicare HMO | Admitting: Family Medicine

## 2023-08-13 ENCOUNTER — Ambulatory Visit (INDEPENDENT_AMBULATORY_CARE_PROVIDER_SITE_OTHER): Payer: Medicare HMO | Admitting: Gastroenterology

## 2023-08-13 ENCOUNTER — Encounter: Payer: Self-pay | Admitting: Family Medicine

## 2023-08-13 VITALS — BP 115/60 | HR 80 | Temp 97.6°F | Ht 64.0 in | Wt 91.0 lb

## 2023-08-13 DIAGNOSIS — K2100 Gastro-esophageal reflux disease with esophagitis, without bleeding: Secondary | ICD-10-CM | POA: Diagnosis not present

## 2023-08-13 DIAGNOSIS — H547 Unspecified visual loss: Secondary | ICD-10-CM | POA: Diagnosis not present

## 2023-08-13 DIAGNOSIS — E039 Hypothyroidism, unspecified: Secondary | ICD-10-CM

## 2023-08-13 DIAGNOSIS — Z0000 Encounter for general adult medical examination without abnormal findings: Secondary | ICD-10-CM

## 2023-08-13 DIAGNOSIS — I6521 Occlusion and stenosis of right carotid artery: Secondary | ICD-10-CM

## 2023-08-13 DIAGNOSIS — Z0001 Encounter for general adult medical examination with abnormal findings: Secondary | ICD-10-CM

## 2023-08-13 DIAGNOSIS — E871 Hypo-osmolality and hyponatremia: Secondary | ICD-10-CM | POA: Diagnosis not present

## 2023-08-13 NOTE — Progress Notes (Signed)
Subjective:  HPI: Lee Wilson is a 77 y.o. male presenting on 08/13/2023 for Follow-up (3 mo f/u)   HPI Patient is in today for follow up for his chronic conditions including hypothyroidism and GERD. His TSH was high on his labs last week and he admits to missing a 2-3 doses of his Synthroid. He requests a prefilled sleeve of medications from pharmacy as he has trouble keeping up with his medications. His sodium was 131. He reports drinking mostly gatorade and limiting his water intake. He is unable to leave a urine specimen today Lee Wilson also has a history of stroke and right sided internal carotid artery stenosis with 50-69% stenosis last year.   HYPOTHYROIDISM Thyroid control status:uncontrolled Satisfied with current treatment? yes Medication side effects: no Medication compliance: fair compliance Etiology of hypothyroidism:  Recent dose adjustment:no Fatigue: no Cold intolerance: no Heat intolerance: no Weight gain: no Weight loss: yes Constipation: yes Diarrhea/loose stools: no Palpitations: no Lower extremity edema: no Anxiety/depressed mood: no     Review of Systems  All other systems reviewed and are negative.   Relevant past medical history reviewed and updated as indicated.   Past Medical History:  Diagnosis Date   Anemia    Asthma    Cancer (HCC)    Carotid stenosis, asymptomatic, right    Esophageal ulcer    Barrett's esophagus   Fall from height of greater than 3 feet 07/16/2016   GERD (gastroesophageal reflux disease)    Hyperlipidemia    Hypothyroidism    Leg cramp 01/04/2021   Stroke [facility])    Thyroid disease      Past Surgical History:  Procedure Laterality Date   BIOPSY  06/28/2021   Procedure: BIOPSY;  Surgeon: Lemar Lofty., MD;  Location: [facility];  Service: Gastroenterology;;   BIOPSY  09/05/2021   Procedure: BIOPSY;  Surgeon: Dolores Frame, MD;  [facility];  Service: Gastroenterology;;    BIOPSY  01/09/2022   Procedure: BIOPSY;  Surgeon: Dolores Frame, MD;  [facility];  Service: Gastroenterology;;   ESOPHAGOGASTRODUODENOSCOPY (EGD) WITH PROPOFOL N/A 06/28/2021   Procedure: ESOPHAGOGASTRODUODENOSCOPY (EGD) WITH PROPOFOL;  Surgeon: Lemar Lofty., MD;  Location: [facility];  Service: Gastroenterology;  Laterality: N/A;   ESOPHAGOGASTRODUODENOSCOPY (EGD) WITH PROPOFOL N/A 09/05/2021   Procedure: ESOPHAGOGASTRODUODENOSCOPY (EGD) WITH PROPOFOL;  Surgeon: Dolores Frame, MD;  [facility];  Service: Gastroenterology;  Laterality: N/A;  1:30   ESOPHAGOGASTRODUODENOSCOPY (EGD) WITH PROPOFOL N/A 01/09/2022   Procedure: ESOPHAGOGASTRODUODENOSCOPY (EGD) WITH PROPOFOL;  Surgeon: Dolores Frame, MD;  [facility];  Service: Gastroenterology;  Laterality: N/A;  2:00   FINE NEEDLE ASPIRATION  06/28/2021   Procedure: FINE NEEDLE ASPIRATION (FNA) LINEAR;  Surgeon: Lemar Lofty., MD;  Location: [facility];  Service: Gastroenterology;;   FRACTURE SURGERY     UPPER ESOPHAGEAL ENDOSCOPIC ULTRASOUND (EUS) N/A 06/28/2021   Procedure: UPPER ESOPHAGEAL ENDOSCOPIC ULTRASOUND (EUS);  Surgeon: Lemar Lofty., MD;  Location: [facility];  Service: Gastroenterology;  Laterality: N/A;    Allergies and medications reviewed and updated.   Current Outpatient Medications:    albuterol (VENTOLIN HFA) 108 (90 Base) MCG/ACT inhaler, Inhale 2 puffs into the lungs every 4 (four) hours as needed for wheezing or shortness of breath., Disp: 18 g, Rfl: 2   aspirin EC 81 MG tablet, Take 81 mg by mouth daily. Swallow whole., Disp: , Rfl:    diclofenac Sodium (VOLTAREN) 1 % GEL, Apply to  back three times as needed for pain, Disp: 100 g, Rfl: 2   Ensure (ENSURE), Take 237 mLs by mouth See admin instructions. Strawberry flavor - drink one to two cans by mouth daily, Disp: , Rfl:    famotidine (PEPCID) 40 MG tablet, Take 1  tablet (40 mg total) by mouth daily., Disp: 180 tablet, Rfl: 1   HYDROcodone-acetaminophen (HYCET) 7.5-325 mg/15 ml solution, Take 10 mLs by mouth every 6 (six) hours as needed for moderate pain., Disp: 240 mL, Rfl: 0   hydrocortisone 2.5 % cream, Apply topically 2 (two) times daily. Apply to face, Disp: 30 g, Rfl: 2   lansoprazole (PREVACID) 30 MG capsule, TAKE ONE CAPSULE BY MOUTH TWICE DAILY, Disp: 180 capsule, Rfl: 0   levothyroxine (SYNTHROID) 75 MCG tablet, Take 1 tablet (75 mcg total) by mouth daily before breakfast., Disp: 90 tablet, Rfl: 1   Multiple Vitamin (MULTIVITAMIN WITH MINERALS) TABS tablet, Take 1 tablet by mouth daily., Disp: , Rfl:    protein supplement (RESOURCE BENEPROTEIN) 6 g POWD, Take 1 Scoop (6 g total) by mouth 3 (three) times daily with meals., Disp: , Rfl:    SHINGRIX injection, , Disp: , Rfl:    simvastatin (ZOCOR) 80 MG tablet, Take 1 tablet (80 mg total) by mouth at bedtime., Disp: 90 tablet, Rfl: 1   sucralfate (CARAFATE) 1 g tablet, TAKE ONE TABLET BY MOUTH FOUR TIMES DAILY, Disp: 360 tablet, Rfl: 0   vitamin B-12 (CYANOCOBALAMIN) 1000 MCG tablet, Take 1,000 mcg by mouth daily., Disp: , Rfl:   Allergies  Allergen Reactions   Penicillin G Other (See Comments)    Unknown childhood reaction    Objective:   BP 115/60   Pulse 80   Temp 97.6 F (36.4 C) (Oral)   Ht 5\' 4"  (1.626 m)   Wt 91 lb (41.3 kg)   SpO2 95%   BMI 15.62 kg/m      08/13/2023    2:17 PM 05/09/2023    4:15 PM 03/05/2023   12:52 PM  Vitals with BMI  Height 5\' 4"  5\' 4"  5\' 4"   Weight 91 lbs 100 lbs 99 lbs  BMI 15.61 17.16 16.98  Systolic 115 120 161  Diastolic 60 60 78  Pulse 80 75 96     Physical Exam Vitals and nursing note reviewed.  Constitutional:      Appearance: Normal appearance. He is cachectic.  HENT:     Head: Normocephalic and atraumatic.  Cardiovascular:     Rate and Rhythm: Normal rate and regular rhythm.     Pulses: Normal pulses.     Heart sounds: Normal  heart sounds.  Pulmonary:     Effort: Pulmonary effort is normal.     Breath sounds: Normal breath sounds.  Musculoskeletal:     Cervical back: Normal range of motion and neck supple.  Skin:    General: Skin is warm and dry.     Capillary Refill: Capillary refill takes less than 2 seconds.  Neurological:     General: No focal deficit present.     Mental Status: He is alert and oriented to person, place, and time. Mental status is at baseline.  Psychiatric:        Mood and Affect: Mood normal.        Behavior: Behavior normal.        Thought Content: Thought content normal.        Judgment: Judgment normal.     Assessment & Plan:  Gastroesophageal reflux  disease with esophagitis without hemorrhage Assessment & Plan: Chronic. Referral placed to GI as he has not seen them in last year. Continue Pepcid 40mg  daily.  Orders: -     Ambulatory referral to Gastroenterology  Acquired hypothyroidism Assessment & Plan: TSH high. Counseled on importnace of medicaiton compliance. Continue Synthroid daily and recheck in 4 weeks.   Orders: -     AMB Referral [facility] Care Management -     Thyroid Panel With TSH; Future  Carotid stenosis, asymptomatic, right -     US Carotid Bilateral; Future  Hyponatremia Assessment & Plan: Encouraged to limit free water and increase electrolyte drinks. Unable to obtain urine but will return for collection.  Orders: -     COMPLETE METABOLIC PANEL WITH GFR; Future  Encounter for Medicare annual wellness exam  Vision impairment -     Ambulatory referral to Optometry  Stenosis of right carotid artery Assessment & Plan: Chronic. Most recent carotid US showed Right ICA stenosis estimated at 50-69%, Left ICA narrowing less than 50%, Patent antegrade vertebral flow bilaterally. He is overdue for recheck. Continue ASA and Simvastatin 80mg  daily.        Follow up plan: Return in about 4 weeks (around 09/10/2023) for chronic follow-up with labs 1  week prior.  Park Meo, FNP      Subjective:   Lee Wilson is a 77 y.o. male who presents for Medicare Annual/Subsequent preventive examination.  Visit Complete: In person  Patient Medicare AWV questionnaire was completed by the patient on 08/03/2023; I have confirmed that all information answered by patient is correct and no changes since this date.  Cardiac Risk Factors include: advanced age (>66men, >63 women);dyslipidemia;hypertension     Objective:    Today's Vitals   08/13/23 1417  BP: 115/60  Pulse: 80  Temp: 97.6 F (36.4 C)  TempSrc: Oral  SpO2: 95%  Weight: 91 lb (41.3 kg)  Height: 5\' 4"  (1.626 m)   Body mass index is 15.62 kg/m.     03/05/2023   12:58 PM 01/04/2022    1:03 PM 09/05/2021   12:25 PM 08/31/2021    3:32 PM 06/26/2021    3:06 PM 06/07/2021    9:25 AM 05/24/2021    9:40 AM  Advanced Directives  Does Patient Have a Medical Advance Directive? No No No No No No No  Would patient like information on creating a medical advance directive?  No - Patient declined No - Patient declined No - Patient declined No - Patient declined No - Patient declined No - Patient declined    Current Medications (verified) Outpatient Encounter Medications as of 08/13/2023  Medication Sig   albuterol (VENTOLIN HFA) 108 (90 Base) MCG/ACT inhaler Inhale 2 puffs into the lungs every 4 (four) hours as needed for wheezing or shortness of breath.   aspirin EC 81 MG tablet Take 81 mg by mouth daily. Swallow whole.   diclofenac Sodium (VOLTAREN) 1 % GEL Apply to back three times as needed for pain   Ensure (ENSURE) Take 237 mLs by mouth See admin instructions. Strawberry flavor - drink one to two cans by mouth daily   famotidine (PEPCID) 40 MG tablet Take 1 tablet (40 mg total) by mouth daily.   HYDROcodone-acetaminophen (HYCET) 7.5-325 mg/15 ml solution Take 10 mLs by mouth every 6 (six) hours as needed for moderate pain.   hydrocortisone 2.5 % cream Apply topically 2  (two) times daily. Apply to face   lansoprazole (PREVACID) 30  MG capsule TAKE ONE CAPSULE BY MOUTH TWICE DAILY   levothyroxine (SYNTHROID) 75 MCG tablet Take 1 tablet (75 mcg total) by mouth daily before breakfast.   Multiple Vitamin (MULTIVITAMIN WITH MINERALS) TABS tablet Take 1 tablet by mouth daily.   protein supplement (RESOURCE BENEPROTEIN) 6 g POWD Take 1 Scoop (6 g total) by mouth 3 (three) times daily with meals.   SHINGRIX injection    simvastatin (ZOCOR) 80 MG tablet Take 1 tablet (80 mg total) by mouth at bedtime.   sucralfate (CARAFATE) 1 g tablet TAKE ONE TABLET BY MOUTH FOUR TIMES DAILY   vitamin B-12 (CYANOCOBALAMIN) 1000 MCG tablet Take 1,000 mcg by mouth daily.   No facility-administered encounter medications on file as of 08/13/2023.    Allergies (verified) Penicillin g   History: Past Medical History:  Diagnosis Date   Anemia    Asthma    Cancer (HCC)    Carotid stenosis, asymptomatic, right    Esophageal ulcer    Barrett's esophagus   Fall from height of greater than 3 feet 07/16/2016   GERD (gastroesophageal reflux disease)    Hyperlipidemia    Hypothyroidism    Leg cramp 01/04/2021   Stroke [facility])    Thyroid disease    Past Surgical History:  Procedure Laterality Date   BIOPSY  06/28/2021   Procedure: BIOPSY;  Surgeon: Lemar Lofty., MD;  Location: [facility];  Service: Gastroenterology;;   BIOPSY  09/05/2021   Procedure: BIOPSY;  Surgeon: Dolores Frame, MD;  [facility];  Service: Gastroenterology;;   BIOPSY  01/09/2022   Procedure: BIOPSY;  Surgeon: Dolores Frame, MD;  [facility];  Service: Gastroenterology;;   ESOPHAGOGASTRODUODENOSCOPY (EGD) WITH PROPOFOL N/A 06/28/2021   Procedure: ESOPHAGOGASTRODUODENOSCOPY (EGD) WITH PROPOFOL;  Surgeon: Lemar Lofty., MD;  Location: [facility];  Service: Gastroenterology;  Laterality: N/A;   ESOPHAGOGASTRODUODENOSCOPY (EGD) WITH PROPOFOL N/A  09/05/2021   Procedure: ESOPHAGOGASTRODUODENOSCOPY (EGD) WITH PROPOFOL;  Surgeon: Dolores Frame, MD;  [facility];  Service: Gastroenterology;  Laterality: N/A;  1:30   ESOPHAGOGASTRODUODENOSCOPY (EGD) WITH PROPOFOL N/A 01/09/2022   Procedure: ESOPHAGOGASTRODUODENOSCOPY (EGD) WITH PROPOFOL;  Surgeon: Dolores Frame, MD;  [facility];  Service: Gastroenterology;  Laterality: N/A;  2:00   FINE NEEDLE ASPIRATION  06/28/2021   Procedure: FINE NEEDLE ASPIRATION (FNA) LINEAR;  Surgeon: Lemar Lofty., MD;  Location: [facility];  Service: Gastroenterology;;   FRACTURE SURGERY     UPPER ESOPHAGEAL ENDOSCOPIC ULTRASOUND (EUS) N/A 06/28/2021   Procedure: UPPER ESOPHAGEAL ENDOSCOPIC ULTRASOUND (EUS);  Surgeon: Lemar Lofty., MD;  Location: [facility];  Service: Gastroenterology;  Laterality: N/A;   Family History  Problem Relation Age of Onset   Leukemia Mother    Heart attack Father    Stroke Child    Social History   Socioeconomic History   Marital status: Widowed    Spouse name: Not on file   Number of children: 2   Years of education: Not on file   Highest education level: Not on file  Occupational History   Not on file  Tobacco Use   Smoking status: Former   Smokeless tobacco: Never  Vaping Use   Vaping status: Never Used  Substance and Sexual Activity   Alcohol use: Yes    Comment: Occasionally Beer   Drug use: No   Sexual activity: Not on file  Other Topics Concern   Not on file  Social History Narrative   Left  handed   Lives in different places at the moment.    Drinks coffee    Social Determinants of Health   Financial Resource Strain: Low Risk  (08/13/2023)   Overall Financial Resource Strain (CARDIA)    Difficulty of Paying Living Expenses: Not very hard  Food Insecurity: No Food Insecurity (08/13/2023)   Hunger Vital Sign    Worried About Running Out of Food in the Last Year: Never true    Ran Out  of Food in the Last Year: Never true  Transportation Needs: No Transportation Needs (08/13/2023)   PRAPARE - Administrator, Civil Service (Medical): No    Lack of Transportation (Non-Medical): No  Physical Activity: Insufficiently Active (08/13/2023)   Exercise Vital Sign    Days of Exercise per Week: 1 day    Minutes of Exercise per Session: 10 min  Stress: No Stress Concern Present (08/13/2023)   [employer] of Occupational Health - Occupational Stress Questionnaire    Feeling of Stress : Not at all  Social Connections: Socially Isolated (08/13/2023)   Social Connection and Isolation Panel [NHANES]    Frequency of Communication with Friends and Family: Once a week    Frequency of Social Gatherings with Friends and Family: Once a week    Attends Religious Services: Never    Database administrator or Organizations: Patient unable to answer    Attends Banker Meetings: Never    Marital Status: Widowed    Tobacco Counseling Counseling given: Yes   Clinical Intake:  Pre-visit preparation completed: No  Pain : No/denies pain     BMI - recorded: 15.62 Nutritional Status: BMI <19  Underweight Nutritional Risks: Unintentional weight gain Diabetes: No  How often do you need to have someone help you when you read instructions, pamphlets, or other written materials from your doctor or pharmacy?: 1 - Never What is the last grade level you completed in school?: hs graduate  Interpreter Needed?: No      Activities of Daily Living    08/13/2023    2:47 PM  In your present state of health, do you have any difficulty performing the following activities:  Hearing? 0  Vision? 1  Comment sometimes eyes gets blurry  Difficulty concentrating or making decisions? 1  Walking or climbing stairs? 0  Dressing or bathing? 0  Doing errands, shopping? 0  Preparing Food and eating ? Y  Using the Toilet? Y  In the past six months, have you accidently  leaked urine? N  Do you have problems with loss of bowel control? N  Managing your Medications? Y  Managing your Finances? Y  Housekeeping or managing your Housekeeping? Y    Patient Care Team: Park Meo, FNP as PCP - General (Family Medicine) Glendale Chard, DO as Consulting Physician (Neurology) Erroll Luna, [facility] (Inactive) as Pharmacist (Pharmacist)  Indicate any recent Medical Services you may have received from other than [facility] providers in the past year (date may be approximate).     Assessment:   This is a routine wellness examination for Lee Wilson.  Hearing/Vision screen No results found.   Goals Addressed             This Visit's Progress    Improve My Heart Health-Coronary Artery Disease   On track    Timeframe:  Long-Range Goal Priority:  High Start Date: 12/28/21  Expected End Date: 06/30/22                      Follow Up Date 03/30/22    - learn about small changes that will make a big difference - learn my personal risk factors    Why is this important?   Lifestyle changes are key to improving the blood flow to your heart. Think about the things you can change and set a goal to live healthy.  Remember, when the blood vessels to your heart start to get clogged you may not have any symptoms.  Over time, they can get worse.  Don't ignore the signs, like chest pain, and get help right away.     Notes:        Depression Screen    08/13/2023    2:53 PM 02/05/2023   10:05 AM 12/09/2020    8:29 AM 06/08/2020    9:09 AM 12/09/2019   10:23 AM 06/02/2019    8:21 AM 12/01/2018   11:02 AM  PHQ 2/9 Scores  PHQ - 2 Score 0 0 0 0 0 0 0  PHQ- 9 Score  1 0  0      Fall Risk    03/05/2023   12:56 PM 02/05/2023   10:01 AM 04/24/2021    8:41 AM 12/09/2020    8:29 AM 06/08/2020    9:09 AM  Fall Risk   Falls in the past year? 1 1 0 0 1  Comment  per pt it was due to passing out     Number falls in past yr: 1 0 0  1  Comment  per  pt it was due to passing out     Injury with Fall? 1 1 0  0  Comment  per pt it was due to passing out     Risk for fall due to :  Other (Comment)  No Fall Risks History of fall(s);Impaired balance/gait  Risk for fall due to: Comment  per pt it was due to passing out     Follow up Falls evaluation completed   Falls evaluation completed Falls evaluation completed    MEDICARE RISK AT HOME:    TIMED UP AND GO:  Was the test performed?  Yes  Length of time to ambulate 10 feet: 20 sec Gait slow and steady without use of assistive device    Cognitive Function:        08/13/2023    2:55 PM  6CIT Screen  What Year? 0 points  What month? 3 points  What time? 3 points  Count back from 20 0 points  Months in reverse 4 points  Repeat phrase 10 points  Total Score 20 points    Immunizations Immunization History  Administered Date(s) Administered   Fluad Quad(high Dose 65+) 12/09/2019, 12/09/2020, 06/27/2021   Fluad Trivalent(High Dose 65+) 07/11/2023   Influenza, High Dose Seasonal PF 09/16/2017, 12/01/2018   PFIZER(Purple Top)SARS-COV-2 Vaccination 03/22/2020, 04/21/2020   Pneumococcal Conjugate-13 07/25/2016   Pneumococcal Polysaccharide-23 01/14/2015   Tdap 05/25/2017   Zoster Recombinant(Shingrix) 02/05/2023, 07/11/2023    TDAP status: Up to date  Flu Vaccine status: Up to date  Pneumococcal vaccine status: Up to date  Covid-19 vaccine status: Information provided on how to obtain vaccines.   Qualifies for Shingles Vaccine?  Already completed   Zostavax completed No   Shingrix Completed?: Yes  Screening Tests Health Maintenance  Topic Date Due   Fecal DNA (Cologuard)  09/25/2020  COVID-19 Vaccine (3 - Pfizer risk series) 08/29/2023 (Originally 05/19/2020)   Medicare Annual Wellness (AWV)  08/12/2024   DTaP/Tdap/Td (2 - Td or Tdap) 05/26/2027   Pneumonia Vaccine 51+ Years old  Completed   INFLUENZA VACCINE  Completed   Hepatitis C Screening  Completed   Zoster  Vaccines- Shingrix  Completed   HPV VACCINES  Aged Out    Health Maintenance  Health Maintenance Due  Topic Date Due   Fecal DNA (Cologuard)  09/25/2020    Colorectal cancer screening: Referral to GI placed 08/13/2023. Pt aware the office will call re: appt.  Lung Cancer Screening: (Low Dose CT Chest recommended if Age 106-80 years, 20 pack-year currently smoking OR have quit w/in 15years.) does not qualify.   Lung Cancer Screening Referral: no  Additional Screening:  Hepatitis C Screening: does not qualify; Completed 09/16/17  Vision Screening: Recommended annual ophthalmology exams for early detection of glaucoma and other disorders of the eye. Is the patient up to date with their annual eye exam?  No  Who is the provider or what is the name of the office in which the patient attends annual eye exams? [place], [place] If pt is not established with a provider, would they like to be referred to a provider to establish care? Yes .   Dental Screening: Recommended annual dental exams for proper oral hygiene  Diabetic Foot Exam: n/a  Community Resource Referral / Chronic Care Management: CRR required this visit?  Yes   CCM required this visit?  PCP completed     Plan:     I have personally reviewed and noted the following in the patient's chart:   Medical and social history Use of alcohol, tobacco or illicit drugs  Current medications and supplements including opioid prescriptions. Patient is not currently taking opioid prescriptions. Functional ability and status Nutritional status Physical activity Advanced directives List of other physicians Hospitalizations, surgeries, and ER visits in previous 12 months Vitals Screenings to include cognitive, depression, and falls Referrals and appointments  In addition, I have reviewed and discussed with patient certain preventive protocols, quality metrics, and best practice recommendations. A written personalized care plan for  preventive services as well as general preventive health recommendations were provided to patient.     Park Meo, FNP   08/13/2023   After Visit Summary: (In Person-Printed) AVS printed and given to the patient  Nurse Notes: n/a

## 2023-08-13 NOTE — Assessment & Plan Note (Signed)
Chronic. Referral placed to GI as he has not seen them in last year. Continue Pepcid 40mg  daily.

## 2023-08-13 NOTE — Patient Instructions (Signed)
Fat and Cholesterol Restricted Eating Plan Eating a diet that limits fat and cholesterol may help lower your risk for heart disease and other conditions. Your body needs fat and cholesterol for basic functions, but eating too much of these things can be harmful to your health. Your health care provider may order lab tests to check your blood fat (lipid) and cholesterol levels. This helps your health care provider understand your risk for certain conditions and whether you need to make diet changes. Work with your health care provider or dietitian to make an eating plan that is right for you. Your plan includes: Limit your fat intake to ______% or less of your total calories a day. This is ______g of fat per day. Limit your saturated fat intake to ______% or less of your total calories a day. This is ______g of saturated fat per day. Limit the amount of cholesterol in your diet to less than _________mg a day. Eat ___________ g of fiber a day. What are tips for following this plan? General guidelines If you are overweight, work with your health care provider to lose weight safely. Losing just 5-10% of your body weight can improve your overall health and help prevent diseases such as diabetes and heart disease. Avoid: Foods with added sugar. Fried foods. Foods that contain partially hydrogenated oils, including stick margarine, some tub margarines, cookies, crackers, and other baked goods. If you drink alcohol: Limit how much you have to: 0-1 drink a day for women who are not pregnant. 0-2 drinks a day for men. Know how much alcohol is in a drink. In the [place] one drink equals one 12 oz bottle of beer (355 mL), one 5 oz glass of wine (148 mL), or one 1 oz glass of hard liquor (44 mL). Reading food labels Check food labels for: Trans fats or partially hydrogenated oils. Avoid foods that contain these. High amounts of saturated fat. Choose foods that are low in saturated fat (less than 2 g). The  amount of cholesterol in each serving. The amount of fiber in each serving. Choose foods with healthy fats, such as: Monounsaturated and polyunsaturated fats. These include olive and canola oil, flaxseeds, walnuts, almonds, and seeds. Omega-3 fats. These are found in foods such as salmon, mackerel, sardines, tuna, flaxseed oil, and ground flaxseeds. Choose grain products that have whole grains. Look for the word "whole" as the first word in the ingredient list. Cooking Cook foods using methods other than frying. Baking, boiling, grilling, and broiling are some healthy options. Eat more home-cooked food and less restaurant, buffet, and fast food. Avoid cooking using saturated fats. Animal sources of saturated fats include meats, butter, and cream. Plant sources of saturated fats include palm oil, palm kernel oil, and coconut oil. Meal planning  At meals, imagine dividing your plate into fourths: Fill one-half of your plate with vegetables, green salads, and fruit. Fill one-fourth of your plate with whole grains. Fill one-fourth of your plate with lean protein foods. Eat fish that is high in omega-3 fats at least two times a week. Eat more foods that contain fiber, such as whole grains, beans, apples, pears, berries, broccoli, carrots, peas, and barley. These foods help promote healthy cholesterol levels in the blood. What foods should I eat? Fruits All fresh, canned (in natural juice), or frozen fruits. Vegetables Fresh or frozen vegetables (raw, steamed, roasted, or grilled). Green salads. Grains Whole grains, such as whole wheat or whole grain breads, crackers, cereals, and pasta. Unsweetened oatmeal, bulgur,  barley, quinoa, or brown rice. Corn or whole wheat flour tortillas. Meats and other proteins Ground beef (85% or leaner), grass-fed beef, or beef trimmed of fat. Skinless chicken or [place]. Ground chicken or [place]. Pork trimmed of fat. All fish and seafood. Egg whites. Dried beans,  peas, or lentils. Unsalted nuts or seeds. Unsalted canned beans. Natural nut butters without added sugar and oil. Dairy Low-fat or nonfat dairy products, such as skim or 1% milk, 2% or reduced-fat cheeses, low-fat and fat-free ricotta or cottage cheese, or plain low-fat and nonfat yogurt. Fats and oils Tub margarine without trans fats. Light or reduced-fat mayonnaise and salad dressings. Avocado. Olive, canola, sesame, or safflower oils. The items listed above may not be a complete list of foods and beverages you can eat. Contact a dietitian for more information. What foods should I avoid? Fruits Canned fruit in heavy syrup. Fruit in cream or butter sauce. Fried fruit. Vegetables Vegetables cooked in cheese, cream, or butter sauce. Fried vegetables. Grains White bread. White pasta. White rice. Cornbread. Bagels, pastries, and croissants. Crackers and snack foods that contain trans fat and hydrogenated oils. Meats and other proteins Fatty cuts of meat. Ribs, chicken wings, bacon, sausage, bologna, salami, chitterlings, fatback, hot dogs, bratwurst, and packaged lunch meats. Liver and organ meats. Whole eggs and egg yolks. Chicken and [place] with skin. Fried meat. Dairy Whole or 2% milk, cream, half-and-half, and cream cheese. Whole milk cheeses. Whole-fat or sweetened yogurt. Full-fat cheeses. Nondairy creamers and whipped toppings. Processed cheese, cheese spreads, and cheese curds. Fats and oils Butter, stick margarine, lard, shortening, ghee, or bacon fat. Coconut, palm kernel, and palm oils. Beverages Alcohol. Sugar-sweetened drinks such as sodas, lemonade, and fruit drinks. Sweets and desserts Corn syrup, sugars, honey, and molasses. Candy. Jam and jelly. Syrup. Sweetened cereals. Cookies, pies, cakes, donuts, muffins, and ice cream. The items listed above may not be a complete list of foods and beverages you should avoid. Contact a dietitian for more information. Summary Your body needs  fat and cholesterol for basic functions. However, eating too much of these things can be harmful to your health. Work with your health care provider and dietitian to follow a diet that limits fat and cholesterol. Doing this may help lower your risk for heart disease and other conditions. Choose healthy fats, such as monounsaturated and polyunsaturated fats, and foods high in omega-3 fatty acids. Eat fiber-rich foods, such as whole grains, beans, peas, fruits, and vegetables. Limit or avoid alcohol, fried foods, and foods high in saturated fats, partially hydrogenated oils, and sugar. This information is not intended to replace advice given to you by your health care provider. Make sure you discuss any questions you have with your health care provider. Document Revised: 02/10/2021 Document Reviewed: 02/10/2021 Elsevier Patient Education  2024 [employer].  Fall Prevention in the Home, Adult Falls can cause injuries and can happen to people of all ages. There are many things you can do to make your home safer and to help prevent falls. What actions can I take to prevent falls? General information Use good lighting in all rooms. Make sure to: Replace any light bulbs that burn out. Turn on the lights in dark areas and use night-lights. Keep items that you use often in easy-to-reach places. Lower the shelves around your home if needed. Move furniture so that there are clear paths around it. Do not use throw rugs or other things on the floor that can make you trip. If any of your  floors are uneven, fix them. Add color or contrast paint or tape to clearly mark and help you see: Grab bars or handrails. First and last steps of staircases. Where the edge of each step is. If you use a ladder or stepladder: Make sure that it is fully opened. Do not climb a closed ladder. Make sure the sides of the ladder are locked in place. Have someone hold the ladder while you use it. Know where your pets are as  you move through your home. What can I do in the bathroom?     Keep the floor dry. Clean up any water on the floor right away. Remove soap buildup in the bathtub or shower. Buildup makes bathtubs and showers slippery. Use non-skid mats or decals on the floor of the bathtub or shower. Attach bath mats securely with double-sided, non-slip rug tape. If you need to sit down in the shower, use a non-slip stool. Install grab bars by the toilet and in the bathtub and shower. Do not use towel bars as grab bars. What can I do in the bedroom? Make sure that you have a light by your bed that is easy to reach. Do not use any sheets or blankets on your bed that hang to the floor. Have a firm chair or bench with side arms that you can use for support when you get dressed. What can I do in the kitchen? Clean up any spills right away. If you need to reach something above you, use a step stool with a grab bar. Keep electrical cords out of the way. Do not use floor polish or wax that makes floors slippery. What can I do with my stairs? Do not leave anything on the stairs. Make sure that you have a light switch at the top and the bottom of the stairs. Make sure that there are handrails on both sides of the stairs. Fix handrails that are broken or loose. Install non-slip stair treads on all your stairs if they do not have carpet. Avoid having throw rugs at the top or bottom of the stairs. Choose a carpet that does not hide the edge of the steps on the stairs. Make sure that the carpet is firmly attached to the stairs. Fix carpet that is loose or worn. What can I do on the outside of my home? Use bright outdoor lighting. Fix the edges of walkways and driveways and fix any cracks. Clear paths of anything that can make you trip, such as tools or rocks. Add color or contrast paint or tape to clearly mark and help you see anything that might make you trip as you walk through a door, such as a raised step or  threshold. Trim any bushes or trees on paths to your home. Check to see if handrails are loose or broken and that both sides of all steps have handrails. Install guardrails along the edges of any raised decks and porches. Have leaves, snow, or ice cleared regularly. Use sand, salt, or ice melter on paths if you live where there is ice and snow during the winter. Clean up any spills in your garage right away. This includes grease or oil spills. What other actions can I take? Review your medicines with your doctor. Some medicines can cause dizziness or changes in blood pressure, which increase your risk of falling. Wear shoes that: Have a low heel. Do not wear high heels. Have rubber bottoms and are closed at the toe. Feel good on your  feet and fit well. Use tools that help you move around if needed. These include: Canes. Walkers. Scooters. Crutches. Ask your doctor what else you can do to help prevent falls. This may include seeing a physical therapist to learn to do exercises to move better and get stronger. Where to find more information Centers for Disease Control and Prevention, STEADI: TonerPromos.no [employer] on Aging: BaseRingTones.pl National Institute on Aging: BaseRingTones.pl Contact a doctor if: You are afraid of falling at home. You feel weak, drowsy, or dizzy at home. You fall at home. Get help right away if you: Lose consciousness or have trouble moving after a fall. Have a fall that causes a head injury. These symptoms may be an emergency. Get help right away. Call 911. Do not wait to see if the symptoms will go away. Do not drive yourself to the hospital. This information is not intended to replace advice given to you by your health care provider. Make sure you discuss any questions you have with your health care provider. Document Revised: 06/04/2022 Document Reviewed: 06/04/2022 Elsevier Patient Education  2024 [employer].

## 2023-08-13 NOTE — Assessment & Plan Note (Signed)
Encouraged to limit free water and increase electrolyte drinks. Unable to obtain urine but will return for collection.

## 2023-08-13 NOTE — Assessment & Plan Note (Signed)
TSH high. Counseled on importnace of medicaiton compliance. Continue Synthroid daily and recheck in 4 weeks.

## 2023-08-13 NOTE — Assessment & Plan Note (Signed)
Chronic. Most recent carotid US showed Right ICA stenosis estimated at 50-69%, Left ICA narrowing less than 50%, Patent antegrade vertebral flow bilaterally. He is overdue for recheck. Continue ASA and Simvastatin 80mg  daily.

## 2023-08-15 ENCOUNTER — Encounter (INDEPENDENT_AMBULATORY_CARE_PROVIDER_SITE_OTHER): Payer: Self-pay | Admitting: *Deleted

## 2023-08-16 ENCOUNTER — Other Ambulatory Visit: Payer: Medicare HMO

## 2023-08-16 ENCOUNTER — Telehealth: Payer: Self-pay | Admitting: *Deleted

## 2023-08-16 ENCOUNTER — Other Ambulatory Visit: Payer: Self-pay

## 2023-08-16 DIAGNOSIS — E871 Hypo-osmolality and hyponatremia: Secondary | ICD-10-CM | POA: Diagnosis not present

## 2023-08-16 NOTE — Progress Notes (Unsigned)
  Care Coordination  Outreach Note  08/16/2023 Name: Lee Wilson MRN: [id number] DOB: 07/27/1946   Care Coordination Outreach Attempts: An unsuccessful telephone outreach was attempted today to offer the patient information about available care coordination services.  Follow Up Plan:  Additional outreach attempts will be made to offer the patient care coordination information and services.   Encounter Outcome:  No Answer  Gwenevere Ghazi  Care Coordination Care Guide  Direct Dial: [phone]

## 2023-08-19 ENCOUNTER — Encounter: Payer: Self-pay | Admitting: Neurology

## 2023-08-19 ENCOUNTER — Other Ambulatory Visit: Payer: Medicare HMO

## 2023-08-19 ENCOUNTER — Ambulatory Visit: Payer: Medicare HMO | Admitting: Neurology

## 2023-08-19 VITALS — BP 131/74 | HR 95 | Ht 64.0 in | Wt 91.0 lb

## 2023-08-19 DIAGNOSIS — R404 Transient alteration of awareness: Secondary | ICD-10-CM

## 2023-08-19 NOTE — Progress Notes (Signed)
Follow-up Visit   Date: 08/19/2023    Lee Wilson MRN: [id number] DOB: 1946-08-24    Lee Wilson is a 77 y.o. left-handed Caucasian male with GERD, asthma, hypothyroidism, and alcohol abuse  returning to the clinic for evaluation of seizures.  The patient was accompanied to the clinic by self.  IMPRESSION/PLAN: Episode of loss of consciousness (02/2023).  Hospital notes indicate there was concern of seizure vs stroke.  MRI brian was hampered by movement artifact. He has not had any further spells. EEG shows mild diffuse slowing, no epileptiform discharges.  Low threshold to start Keppra, at this point, it's not clear to me whether this event was a seizure or more of a syncopal event.  Continue to monitor.   Return to clinic as needed  --------------------------------------------- History of present illness: He reports passing out when he was at a friends home. He was hospitalized with altered mental status and concerned about stroke vs seizure.  He denies drinking excess or stopping abruptly. It looks like he was on Keppra 100mg  twice daily, however, it is not on his current medication list that he provides.  MRI was equivocal for stroke.  CTA shows no large vessel occlusion.  He has not had these symptoms recur.  No new complaints.   UPDATE 08/19/2023:  He is here for follow-up visit. He reports having no further spells of altered awareness.  He is doing well with no new complaints.  Of note, he is very thin appearing and continues to loose weight.  He reports having difficulty with eating foods because of poor dentition.  He is mostly on a soft food diet and drinks some ensure.  Upon further questioning, he drinks a couple of beers.    Medications:  Current Outpatient Medications on File Prior to Visit  Medication Sig Dispense Refill   albuterol (VENTOLIN HFA) 108 (90 Base) MCG/ACT inhaler Inhale 2 puffs into the lungs every 4 (four) hours as needed for wheezing or  shortness of breath. 18 g 2   aspirin EC 81 MG tablet Take 81 mg by mouth daily. Swallow whole.     diclofenac Sodium (VOLTAREN) 1 % GEL Apply to back three times as needed for pain 100 g 2   Ensure (ENSURE) Take 237 mLs by mouth See admin instructions. Strawberry flavor - drink one to two cans by mouth daily     famotidine (PEPCID) 40 MG tablet Take 1 tablet (40 mg total) by mouth daily. 180 tablet 1   HYDROcodone-acetaminophen (HYCET) 7.5-325 mg/15 ml solution Take 10 mLs by mouth every 6 (six) hours as needed for moderate pain. 240 mL 0   hydrocortisone 2.5 % cream Apply topically 2 (two) times daily. Apply to face 30 g 2   lansoprazole (PREVACID) 30 MG capsule TAKE ONE CAPSULE BY MOUTH TWICE DAILY 180 capsule 0   levothyroxine (SYNTHROID) 75 MCG tablet Take 1 tablet (75 mcg total) by mouth daily before breakfast. 90 tablet 1   Multiple Vitamin (MULTIVITAMIN WITH MINERALS) TABS tablet Take 1 tablet by mouth daily.     protein supplement (RESOURCE BENEPROTEIN) 6 g POWD Take 1 Scoop (6 g total) by mouth 3 (three) times daily with meals.     SHINGRIX injection      simvastatin (ZOCOR) 80 MG tablet Take 1 tablet (80 mg total) by mouth at bedtime. 90 tablet 1   sucralfate (CARAFATE) 1 g tablet TAKE ONE TABLET BY MOUTH FOUR TIMES DAILY 360 tablet 0   vitamin  B-12 (CYANOCOBALAMIN) 1000 MCG tablet Take 1,000 mcg by mouth daily.     No current facility-administered medications on file prior to visit.    Allergies:  Allergies  Allergen Reactions   Penicillin G Other (See Comments)    Unknown childhood reaction    Vital Signs:  BP 131/74   Pulse 95   Ht 5\' 4"  (1.626 m)   Wt 91 lb (41.3 kg)   SpO2 96%   BMI 15.62 kg/m   General:  very thin appearing  Neurological Exam: MENTAL STATUS including orientation to time, place, person, recent and remote memory, attention span and concentration, language, and fund of knowledge is normal.  Speech is not dysarthric.  CRANIAL NERVES:   Pupils  equal round and reactive to light.  Normal conjugate, extra-ocular eye movements in all directions of gaze.  No ptosis.  Face is symmetric. Temporal wasting.   MOTOR:  Motor strength is 5/5 in all extremities.  Generalized loss of muscle bulk.  No atrophy, fasciculations or abnormal movements.  No pronator drift.  Tone is normal.    MSRs:  Reflexes are 2+/4 throughout, except 3+/4 at the knees bilaterally.  SENSORY:  Intact to vibration throughout.  COORDINATION/GAIT:  Normal finger-to- nose-finger.  Intact rapid alternating movements bilaterally.  Gait is mildly stopped, unassisted, stable.    Data: MRI brain wo contrast 11/06/2022: 1. Seizure protocol coronal T2 and T2 FLAIR sequences through the  hippocampi could not be obtained due to limited patient cooperation.  2. 5 mm focus of apparent restricted diffusion along the  mid-to-posterior left frontal lobe (abutting the calvarium), which  may reflect an acute infarct or artifact.  3. 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  4. Paranasal sinus disease, as outlined.  5. Small-volume fluid within the right mastoid air cells.   CTA head and neck 11/06/2022: 1. Negative for large vessel occlusion.   2. Positive for bulky calcified plaque at the Right ICA origin with STRING-SIGN-STENOSIS, but the Right ICA remains patent.   3. Positive also for Severe stenosis of the non-dominant Right Vertebral Artery origin. Diminutive and irregular right V4 segment.  Dominant Left Vertebral Artery supplies the basilar with only mild plaque, stenosis.   4. Moderate bilateral ICA siphon calcified plaque but only mild siphon stenosis.   5. Progressed since PET-CT last year nonspecific subpleural and peribronchial lung disease greater on the right. Underlying emphysema (ICD10-J43.9).   EEG 03/14/2023:   This limited awake and drowsy EEG is abnormal due to slowing of the posterior dominant rhythm and  mild diffuse slowing of the waking background. EEG is limited by movement artifact obscuring bilateral temporal regions.   Ambulatory EEG 04/24/2023: This 24-hour ambulatory EEG study is mildly abnormal due to the slowing of the posterior dominant rhythm and mild diffuse slowing of the waking background.     Thank you for allowing me to participate in patient's care.  If I can answer any additional questions, I would be pleased to do so.    Sincerely,    Lewanda Perea K. Allena Katz, DO

## 2023-08-20 ENCOUNTER — Other Ambulatory Visit: Payer: Self-pay | Admitting: Family Medicine

## 2023-08-20 ENCOUNTER — Encounter (INDEPENDENT_AMBULATORY_CARE_PROVIDER_SITE_OTHER): Payer: Self-pay | Admitting: *Deleted

## 2023-08-20 DIAGNOSIS — E039 Hypothyroidism, unspecified: Secondary | ICD-10-CM

## 2023-08-20 DIAGNOSIS — E871 Hypo-osmolality and hyponatremia: Secondary | ICD-10-CM

## 2023-08-20 LAB — SODIUM, URINE, RANDOM: Sodium, Ur: 92 mmol/L (ref 28–272)

## 2023-08-20 NOTE — Progress Notes (Signed)
  Care Coordination  Outreach Note  08/20/2023 Name: KENNA KIRN MRN: [id number] DOB: 15-Jan-1946   Care Coordination Outreach Attempts: A third unsuccessful outreach was attempted today to offer the patient with information about available care coordination services.  Follow Up Plan:  No further outreach attempts will be made at this time. We have been unable to contact the patient to offer or enroll patient in care coordination services  Encounter Outcome:  No Answer  Gwenevere Ghazi  Care Coordination Care Guide  Direct Dial: [phone]

## 2023-08-21 LAB — OSMOLALITY, URINE: Osmolality, Ur: 355 mosm/kg (ref 50–1200)

## 2023-08-23 ENCOUNTER — Telehealth: Payer: Self-pay | Admitting: Family Medicine

## 2023-08-23 DIAGNOSIS — E039 Hypothyroidism, unspecified: Secondary | ICD-10-CM

## 2023-08-23 DIAGNOSIS — R21 Rash and other nonspecific skin eruption: Secondary | ICD-10-CM

## 2023-08-23 DIAGNOSIS — J410 Simple chronic bronchitis: Secondary | ICD-10-CM

## 2023-08-23 DIAGNOSIS — K2210 Ulcer of esophagus without bleeding: Secondary | ICD-10-CM

## 2023-08-23 DIAGNOSIS — K2100 Gastro-esophageal reflux disease with esophagitis, without bleeding: Secondary | ICD-10-CM

## 2023-08-23 MED ORDER — HYDROCORTISONE 2.5 % EX CREA: TOPICAL_CREAM | Freq: Two times a day (BID) | CUTANEOUS | 2 refills | Status: DC

## 2023-08-23 MED ORDER — SIMVASTATIN 80 MG PO TABS: 80.0000 mg | ORAL_TABLET | Freq: Every day | ORAL | 1 refills | Status: DC

## 2023-08-23 MED ORDER — ALBUTEROL SULFATE HFA 108 (90 BASE) MCG/ACT IN AERS: 2.0000 | INHALATION_SPRAY | RESPIRATORY_TRACT | 2 refills | Status: DC | PRN

## 2023-08-23 MED ORDER — SUCRALFATE 1 G PO TABS: 1.0000 g | ORAL_TABLET | Freq: Four times a day (QID) | ORAL | 0 refills | Status: DC

## 2023-08-23 MED ORDER — FAMOTIDINE 40 MG PO TABS: 40.0000 mg | ORAL_TABLET | Freq: Every day | ORAL | 1 refills | Status: DC

## 2023-08-23 MED ORDER — LEVOTHYROXINE SODIUM 75 MCG PO TABS: 75.0000 ug | ORAL_TABLET | Freq: Every day | ORAL | 1 refills | Status: DC

## 2023-08-23 MED ORDER — LANSOPRAZOLE 30 MG PO CPDR: 30.0000 mg | DELAYED_RELEASE_CAPSULE | Freq: Two times a day (BID) | ORAL | 0 refills | Status: DC

## 2023-08-23 NOTE — Telephone Encounter (Signed)
Patient came to the office to follow up on refills recently requested for all of his medication (stated the pharmacy hasn't received the refill requests yet  Patient requesting for his preferred pharmacy to be changed to:  [facility] [address] [place], [place] [address] Phone: [phone] Fax: [phone] Pharmacist: Bonnye Fava, RPh E: [email or web address]  Please advise patient when refills sent at [phone].

## 2023-09-05 ENCOUNTER — Other Ambulatory Visit: Payer: Medicare HMO

## 2023-09-05 DIAGNOSIS — E039 Hypothyroidism, unspecified: Secondary | ICD-10-CM | POA: Diagnosis not present

## 2023-09-05 DIAGNOSIS — E871 Hypo-osmolality and hyponatremia: Secondary | ICD-10-CM

## 2023-09-06 ENCOUNTER — Encounter: Payer: Self-pay | Admitting: Oncology

## 2023-09-06 LAB — COMPLETE METABOLIC PANEL WITH GFR
AG Ratio: 1.3 (calc) (ref 1.0–2.5)
ALT: 13 U/L (ref 9–46)
AST: 25 U/L (ref 10–35)
Albumin: 4 g/dL (ref 3.6–5.1)
Alkaline phosphatase (APISO): 48 U/L (ref 35–144)
BUN: 9 mg/dL (ref 7–25)
CO2: 27 mmol/L (ref 20–32)
Calcium: 9.4 mg/dL (ref 8.6–10.3)
Chloride: 95 mmol/L — ABNORMAL LOW (ref 98–110)
Creat: 0.98 mg/dL (ref 0.70–1.28)
Globulin: 3.1 g/dL (ref 1.9–3.7)
Glucose, Bld: 108 mg/dL — ABNORMAL HIGH (ref 65–99)
Potassium: 4.6 mmol/L (ref 3.5–5.3)
Sodium: 131 mmol/L — ABNORMAL LOW (ref 135–146)
Total Bilirubin: 0.5 mg/dL (ref 0.2–1.2)
Total Protein: 7.1 g/dL (ref 6.1–8.1)
eGFR: 80 mL/min/{1.73_m2} (ref >=60)

## 2023-09-06 LAB — THYROID PANEL WITH TSH
Free Thyroxine Index: 2.1 (ref 1.4–3.8)
T3 Uptake: 33 % (ref 22–35)
T4, Total: 6.3 ug/dL (ref 4.9–10.5)
TSH: 12.43 m[IU]/L — ABNORMAL HIGH (ref 0.40–4.50)

## 2023-09-09 ENCOUNTER — Ambulatory Visit: Payer: Medicare HMO | Admitting: Family Medicine

## 2023-09-10 ENCOUNTER — Other Ambulatory Visit: Payer: Self-pay | Admitting: Family Medicine

## 2023-09-10 DIAGNOSIS — E039 Hypothyroidism, unspecified: Secondary | ICD-10-CM

## 2023-09-10 MED ORDER — LEVOTHYROXINE SODIUM 88 MCG PO TABS: 88.0000 ug | ORAL_TABLET | Freq: Every day | ORAL | 1 refills | Status: DC

## 2023-09-20 ENCOUNTER — Other Ambulatory Visit: Payer: Self-pay

## 2023-09-20 DIAGNOSIS — F1010 Alcohol abuse, uncomplicated: Secondary | ICD-10-CM

## 2023-09-20 DIAGNOSIS — I6521 Occlusion and stenosis of right carotid artery: Secondary | ICD-10-CM

## 2023-09-20 DIAGNOSIS — K2210 Ulcer of esophagus without bleeding: Secondary | ICD-10-CM

## 2023-09-20 DIAGNOSIS — K2100 Gastro-esophageal reflux disease with esophagitis, without bleeding: Secondary | ICD-10-CM

## 2023-09-20 DIAGNOSIS — E441 Mild protein-calorie malnutrition: Secondary | ICD-10-CM

## 2023-09-20 DIAGNOSIS — R21 Rash and other nonspecific skin eruption: Secondary | ICD-10-CM

## 2023-09-20 DIAGNOSIS — E039 Hypothyroidism, unspecified: Secondary | ICD-10-CM

## 2023-09-20 DIAGNOSIS — J410 Simple chronic bronchitis: Secondary | ICD-10-CM

## 2023-09-20 MED ORDER — LEVOTHYROXINE SODIUM 88 MCG PO TABS: 88.0000 ug | ORAL_TABLET | Freq: Every day | ORAL | 1 refills | Status: DC

## 2023-09-20 MED ORDER — FAMOTIDINE 40 MG PO TABS: 40.0000 mg | ORAL_TABLET | Freq: Every day | ORAL | 1 refills | Status: AC

## 2023-09-20 MED ORDER — VITAMIN B-12 1000 MCG PO TABS: 1000.0000 ug | ORAL_TABLET | Freq: Every day | ORAL | 3 refills | Status: AC

## 2023-09-20 MED ORDER — HYDROCORTISONE 2.5 % EX CREA: TOPICAL_CREAM | Freq: Two times a day (BID) | CUTANEOUS | 2 refills | Status: AC

## 2023-09-20 MED ORDER — LANSOPRAZOLE 30 MG PO CPDR: 30.0000 mg | DELAYED_RELEASE_CAPSULE | Freq: Two times a day (BID) | ORAL | 1 refills | Status: AC

## 2023-09-20 MED ORDER — SUCRALFATE 1 G PO TABS: 1.0000 g | ORAL_TABLET | Freq: Four times a day (QID) | ORAL | 0 refills | Status: DC

## 2023-09-20 MED ORDER — ALBUTEROL SULFATE HFA 108 (90 BASE) MCG/ACT IN AERS: 2.0000 | INHALATION_SPRAY | RESPIRATORY_TRACT | 2 refills | Status: AC | PRN

## 2023-09-20 MED ORDER — SIMVASTATIN 80 MG PO TABS: 80.0000 mg | ORAL_TABLET | Freq: Every day | ORAL | 1 refills | Status: AC

## 2023-09-30 ENCOUNTER — Encounter: Payer: Medicare HMO | Admitting: Family Medicine

## 2023-11-14 DIAGNOSIS — K219 Gastro-esophageal reflux disease without esophagitis: Secondary | ICD-10-CM | POA: Diagnosis not present

## 2023-11-14 DIAGNOSIS — R410 Disorientation, unspecified: Secondary | ICD-10-CM | POA: Diagnosis not present

## 2023-11-14 DIAGNOSIS — F29 Unspecified psychosis not due to a substance or known physiological condition: Secondary | ICD-10-CM | POA: Diagnosis not present

## 2023-11-14 DIAGNOSIS — J45909 Unspecified asthma, uncomplicated: Secondary | ICD-10-CM | POA: Diagnosis not present

## 2023-11-14 DIAGNOSIS — R0902 Hypoxemia: Secondary | ICD-10-CM | POA: Diagnosis not present

## 2023-11-14 DIAGNOSIS — I10 Essential (primary) hypertension: Secondary | ICD-10-CM | POA: Diagnosis not present

## 2023-11-16 ENCOUNTER — Other Ambulatory Visit: Payer: Self-pay

## 2023-11-19 ENCOUNTER — Inpatient Hospital Stay: Payer: Medicare HMO | Admitting: Family Medicine

## 2023-11-19 ENCOUNTER — Other Ambulatory Visit: Payer: Self-pay

## 2023-12-02 ENCOUNTER — Encounter: Payer: Self-pay | Admitting: Family Medicine

## 2023-12-02 ENCOUNTER — Ambulatory Visit (INDEPENDENT_AMBULATORY_CARE_PROVIDER_SITE_OTHER): Payer: Medicare HMO | Admitting: Family Medicine

## 2023-12-02 VITALS — BP 140/72 | HR 61 | Temp 97.3°F | Ht 64.0 in | Wt 93.0 lb

## 2023-12-02 DIAGNOSIS — E871 Hypo-osmolality and hyponatremia: Secondary | ICD-10-CM | POA: Diagnosis not present

## 2023-12-02 DIAGNOSIS — E039 Hypothyroidism, unspecified: Secondary | ICD-10-CM | POA: Diagnosis not present

## 2023-12-02 DIAGNOSIS — R413 Other amnesia: Secondary | ICD-10-CM | POA: Insufficient documentation

## 2023-12-02 DIAGNOSIS — R410 Disorientation, unspecified: Secondary | ICD-10-CM | POA: Insufficient documentation

## 2023-12-02 NOTE — Assessment & Plan Note (Signed)
 Repeat labs:

## 2023-12-02 NOTE — Assessment & Plan Note (Signed)
Labs drawn today. His daughter reports he has been compliant with medications over last 2 weeks. Previously uncontrolled.

## 2023-12-02 NOTE — Assessment & Plan Note (Signed)
Reported since October per daughter. Was seen at [facility] last year and MRI results were poor, will obtain repeat MRI and refer back to Neurology for urgent follow up. No s/s of stroke on exam today however he is more confused than I recall him being in the past and having difficulty following complicated commands such as tracking with his eyes, log rolling, heel-shin. Neuro exam limited. MMSE 21.
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
## 2023-12-02 NOTE — Assessment & Plan Note (Signed)
MMSE 21, will obtain MRI in setting of confusion and difficult neuro exam. ASAP follow up with Neurology.

## 2023-12-02 NOTE — Patient Instructions (Addendum)
[facility] Dr Allena Katz [phone]

## 2023-12-03 ENCOUNTER — Other Ambulatory Visit: Payer: Self-pay | Admitting: Family Medicine

## 2023-12-03 ENCOUNTER — Other Ambulatory Visit: Payer: Self-pay

## 2023-12-03 ENCOUNTER — Telehealth: Payer: Self-pay | Admitting: Family Medicine

## 2023-12-03 ENCOUNTER — Encounter: Payer: Self-pay | Admitting: Oncology

## 2023-12-03 DIAGNOSIS — E039 Hypothyroidism, unspecified: Secondary | ICD-10-CM

## 2023-12-03 NOTE — Telephone Encounter (Signed)
Outbound call placed to change patient's appointment. Currently scheduled to see provider in 4 weeks on 3/17. Instead, provider requesting labs in 4 weeks (around 3/17), and an office visit in 5 weeks (around 01/06/24).  Patient's voicemail not set up. Unable to leave a message or send SMS.

## 2023-12-04 ENCOUNTER — Other Ambulatory Visit: Payer: Self-pay

## 2023-12-04 ENCOUNTER — Encounter: Payer: Self-pay | Admitting: Oncology

## 2023-12-04 LAB — COMPLETE METABOLIC PANEL WITH GFR
AG Ratio: 1.3 (calc) (ref 1.0–2.5)
ALT: 12 U/L (ref 9–46)
AST: 21 U/L (ref 10–35)
Albumin: 4.2 g/dL (ref 3.6–5.1)
Alkaline phosphatase (APISO): 58 U/L (ref 35–144)
BUN: 9 mg/dL (ref 7–25)
CO2: 26 mmol/L (ref 20–32)
Calcium: 9.2 mg/dL (ref 8.6–10.3)
Chloride: 96 mmol/L — ABNORMAL LOW (ref 98–110)
Creat: 0.92 mg/dL (ref 0.70–1.28)
Globulin: 3.3 g/dL (ref 1.9–3.7)
Glucose, Bld: 102 mg/dL — ABNORMAL HIGH (ref 65–99)
Potassium: 4.1 mmol/L (ref 3.5–5.3)
Sodium: 132 mmol/L — ABNORMAL LOW (ref 135–146)
Total Bilirubin: 0.6 mg/dL (ref 0.2–1.2)
Total Protein: 7.5 g/dL (ref 6.1–8.1)
eGFR: 87 mL/min/{1.73_m2} (ref >=60)

## 2023-12-04 LAB — CBC WITH DIFFERENTIAL/PLATELET
Absolute Lymphocytes: 1532 {cells}/uL (ref 850–3900)
Absolute Monocytes: 540 {cells}/uL (ref 200–950)
Basophils Absolute: 52 {cells}/uL (ref 0–200)
Basophils Relative: 0.7 %
Eosinophils Absolute: 89 {cells}/uL (ref 15–500)
Eosinophils Relative: 1.2 %
HCT: 36.7 % — ABNORMAL LOW (ref 38.5–50.0)
Hemoglobin: 12.5 g/dL — ABNORMAL LOW (ref 13.2–17.1)
MCH: 33.9 pg — ABNORMAL HIGH (ref 27.0–33.0)
MCHC: 34.1 g/dL (ref 32.0–36.0)
MCV: 99.5 fL (ref 80.0–100.0)
MPV: 10.9 fL (ref 7.5–12.5)
Monocytes Relative: 7.3 %
Neutro Abs: 5187 {cells}/uL (ref 1500–7800)
Neutrophils Relative %: 70.1 %
Platelets: 124 10*3/uL — ABNORMAL LOW (ref 140–400)
RBC: 3.69 10*6/uL — ABNORMAL LOW (ref 4.20–5.80)
RDW: 12.3 % (ref 11.0–15.0)
Total Lymphocyte: 20.7 %
WBC: 7.4 10*3/uL (ref 3.8–10.8)

## 2023-12-04 LAB — TEST AUTHORIZATION

## 2023-12-04 LAB — TSH: TSH: 5.13 m[IU]/L — ABNORMAL HIGH (ref 0.40–4.50)

## 2023-12-04 LAB — VITAMIN B12: Vitamin B-12: 370 pg/mL (ref 200–1100)

## 2023-12-27 ENCOUNTER — Other Ambulatory Visit: Payer: Self-pay | Admitting: Family Medicine

## 2023-12-27 DIAGNOSIS — E441 Mild protein-calorie malnutrition: Secondary | ICD-10-CM

## 2023-12-27 DIAGNOSIS — K2100 Gastro-esophageal reflux disease with esophagitis, without bleeding: Secondary | ICD-10-CM

## 2023-12-30 ENCOUNTER — Other Ambulatory Visit: Payer: Medicare HMO

## 2023-12-30 ENCOUNTER — Ambulatory Visit: Payer: Medicare HMO | Admitting: Family Medicine

## 2023-12-30 DIAGNOSIS — E871 Hypo-osmolality and hyponatremia: Secondary | ICD-10-CM

## 2023-12-30 DIAGNOSIS — E039 Hypothyroidism, unspecified: Secondary | ICD-10-CM | POA: Diagnosis not present

## 2023-12-31 ENCOUNTER — Other Ambulatory Visit: Payer: Self-pay | Admitting: Family Medicine

## 2023-12-31 ENCOUNTER — Encounter: Payer: Self-pay | Admitting: Oncology

## 2023-12-31 DIAGNOSIS — K2100 Gastro-esophageal reflux disease with esophagitis, without bleeding: Secondary | ICD-10-CM

## 2023-12-31 DIAGNOSIS — E441 Mild protein-calorie malnutrition: Secondary | ICD-10-CM

## 2023-12-31 LAB — COMPLETE METABOLIC PANEL WITH GFR
AG Ratio: 1.3 (calc) (ref 1.0–2.5)
ALT: 10 U/L (ref 9–46)
AST: 21 U/L (ref 10–35)
Albumin: 4.1 g/dL (ref 3.6–5.1)
Alkaline phosphatase (APISO): 52 U/L (ref 35–144)
BUN: 11 mg/dL (ref 7–25)
CO2: 27 mmol/L (ref 20–32)
Calcium: 9.1 mg/dL (ref 8.6–10.3)
Chloride: 98 mmol/L (ref 98–110)
Creat: 1.01 mg/dL (ref 0.70–1.28)
Globulin: 3.2 g/dL (ref 1.9–3.7)
Glucose, Bld: 91 mg/dL (ref 65–99)
Potassium: 4.4 mmol/L (ref 3.5–5.3)
Sodium: 133 mmol/L — ABNORMAL LOW (ref 135–146)
Total Bilirubin: 0.6 mg/dL (ref 0.2–1.2)
Total Protein: 7.3 g/dL (ref 6.1–8.1)
eGFR: 78 mL/min/{1.73_m2} (ref >=60)

## 2023-12-31 LAB — THYROID PANEL WITH TSH
Free Thyroxine Index: 2.1 (ref 1.4–3.8)
T3 Uptake: 33 % (ref 22–35)
T4, Total: 6.5 ug/dL (ref 4.9–10.5)
TSH: 0.53 m[IU]/L (ref 0.40–4.50)

## 2023-12-31 MED ORDER — SUCRALFATE 1 G PO TABS: 1.0000 g | ORAL_TABLET | Freq: Three times a day (TID) | ORAL | 0 refills | Status: AC

## 2024-01-06 ENCOUNTER — Ambulatory Visit: Payer: Medicare HMO | Admitting: Family Medicine

## 2024-01-07 ENCOUNTER — Other Ambulatory Visit: Payer: Self-pay

## 2024-01-09 ENCOUNTER — Ambulatory Visit: Admitting: Family Medicine

## 2024-01-24 ENCOUNTER — Other Ambulatory Visit: Payer: Self-pay

## 2024-01-24 ENCOUNTER — Other Ambulatory Visit: Payer: Self-pay | Admitting: Family Medicine

## 2024-01-24 DIAGNOSIS — E039 Hypothyroidism, unspecified: Secondary | ICD-10-CM

## 2024-02-17 ENCOUNTER — Ambulatory Visit: Admitting: Family Medicine

## 2024-02-18 ENCOUNTER — Other Ambulatory Visit: Payer: Self-pay

## 2024-02-23 ENCOUNTER — Other Ambulatory Visit: Payer: Self-pay | Admitting: Family Medicine

## 2024-02-23 DIAGNOSIS — E039 Hypothyroidism, unspecified: Secondary | ICD-10-CM

## 2024-02-27 ENCOUNTER — Telehealth: Payer: Self-pay

## 2024-02-27 NOTE — Telephone Encounter (Signed)
 Copied from CRM [id number]. Topic: Appointments - Appointment Cancel/Reschedule >> Feb 27, 2024  1:54 PM Oddis Bench wrote: Patient/patient representative is calling to reschedule an appointment. Refer to attachments for appointment information.

## 2024-02-28 ENCOUNTER — Other Ambulatory Visit: Payer: Self-pay

## 2024-02-28 ENCOUNTER — Ambulatory Visit: Admitting: Family Medicine

## 2024-03-02 ENCOUNTER — Ambulatory Visit: Admitting: Family Medicine

## 2024-03-03 ENCOUNTER — Other Ambulatory Visit: Payer: Self-pay

## 2024-03-12 ENCOUNTER — Ambulatory Visit: Payer: Self-pay

## 2024-03-12 DIAGNOSIS — R1310 Dysphagia, unspecified: Secondary | ICD-10-CM | POA: Diagnosis not present

## 2024-03-12 DIAGNOSIS — K222 Esophageal obstruction: Secondary | ICD-10-CM | POA: Diagnosis not present

## 2024-03-12 DIAGNOSIS — G9341 Metabolic encephalopathy: Secondary | ICD-10-CM | POA: Diagnosis not present

## 2024-03-12 DIAGNOSIS — K2210 Ulcer of esophagus without bleeding: Secondary | ICD-10-CM | POA: Diagnosis not present

## 2024-03-12 DIAGNOSIS — K449 Diaphragmatic hernia without obstruction or gangrene: Secondary | ICD-10-CM | POA: Diagnosis not present

## 2024-03-12 DIAGNOSIS — R531 Weakness: Secondary | ICD-10-CM | POA: Diagnosis not present

## 2024-03-12 DIAGNOSIS — E46 Unspecified protein-calorie malnutrition: Secondary | ICD-10-CM | POA: Diagnosis not present

## 2024-03-12 DIAGNOSIS — G459 Transient cerebral ischemic attack, unspecified: Secondary | ICD-10-CM | POA: Diagnosis not present

## 2024-03-12 DIAGNOSIS — K259 Gastric ulcer, unspecified as acute or chronic, without hemorrhage or perforation: Secondary | ICD-10-CM | POA: Diagnosis not present

## 2024-03-12 DIAGNOSIS — I10 Essential (primary) hypertension: Secondary | ICD-10-CM | POA: Diagnosis not present

## 2024-03-12 DIAGNOSIS — R4182 Altered mental status, unspecified: Secondary | ICD-10-CM | POA: Diagnosis not present

## 2024-03-12 DIAGNOSIS — R2990 Unspecified symptoms and signs involving the nervous system: Secondary | ICD-10-CM | POA: Diagnosis not present

## 2024-03-12 DIAGNOSIS — E871 Hypo-osmolality and hyponatremia: Secondary | ICD-10-CM | POA: Diagnosis not present

## 2024-03-12 NOTE — Telephone Encounter (Signed)
 Chief Complaint: unsteady gait Symptoms: unsteady gait, balance problem, delayed speech, lethargy Frequency: yesterday Pertinent Negatives: Patient denies CP, SOB, H/A, visual changes, one-sided weakness/numbness Disposition: [x] ED /[] Urgent Care (no appt availability in office) / [] Appointment(In office/virtual)/ []  [facility]/ [] Home Care/ [x] Refused Recommended Disposition /[] [facility]/ []  Follow-up with PCP Additional Notes: RN spoke with pt's daughter. Daughter states yesterday at 1600 she noticed the pt developed stroke-like symptoms. Daughter reports pt is off-balance with an unsteady gait. Daughter states pt can't stand without falling backwards or to the side. Daughter reports pt's speech is delayed and he is having difficulty communicating. Daughter denies that his speech is slurred. Daughter also states pt is lethargic today and sleeping often. Pt had a stroke 3 wks ago and thinks he had another stroke overnight. RN advised daughter pt needs to go to the ED and RN would like to call 911. Daughter declined and stated the pt said he will go to the ED tomorrow. RN advised daughter if he becomes agreeable to go, she should call 911. Daughter verbalized understanding. RN also informed CAL.    Copied from CRM [id number]. Topic: Clinical - Red Word Triage >> Mar 12, 2024  3:56 PM Emylou G wrote: [facility] that prompted transfer to Nurse Triage: walking, speech, stroke 3 weeks.Aaron Aas last night Reason for Disposition  Sounds like a life-threatening emergency to the triager  Answer Assessment - Initial Assessment Questions 1. SYMPTOM: "What is the main symptom you are concerned about?" (e.g., weakness, numbness)     Barely stand up, off balance (refusing wheelchair), speech is very slow 2. ONSET: "When did this start?" (minutes, hours, days; while sleeping)     Yesterday afternoon around 4pm 3. LAST NORMAL: "When was the last time you (the patient) were normal (no  symptoms)?"     Yesterday before 4pm  4. PATTERN "Does this come and go, or has it been constant since it started?"  "Is it present now?"     constant 5. CARDIAC SYMPTOMS: "Have you had any of the following symptoms: chest pain, difficulty breathing, palpitations?"     Denies  6. NEUROLOGIC SYMPTOMS: "Have you had any of the following symptoms: headache, dizziness, vision loss, double vision, changes in speech, unsteady on your feet?"     Denies one-sided weakness or numbness. Denies H/A. Denies visual changes (bad vision at baseline) 7. OTHER SYMPTOMS: "Do you have any other symptoms?"     Pt had a stroke 3 wks ago, deficits are worse. Pt thinks he had a stroke in his sleep. Very lethargic - daughter states he can wake up but he is very sleepy and tired  Delayed speech, difficulty communicating. "He wants to say it, it's there, but it takes a few minutes." Denies that it is slurred  Before yesterday afternoon pt was able to walk. Pt is now falling to the side.  Pt agreed to go to the ED tomorrow  Protocols used: [facility]

## 2024-03-13 ENCOUNTER — Other Ambulatory Visit: Payer: Self-pay

## 2024-03-13 DIAGNOSIS — R2681 Unsteadiness on feet: Secondary | ICD-10-CM | POA: Diagnosis not present

## 2024-03-13 DIAGNOSIS — R64 Cachexia: Secondary | ICD-10-CM | POA: Diagnosis not present

## 2024-03-13 DIAGNOSIS — K5909 Other constipation: Secondary | ICD-10-CM | POA: Diagnosis not present

## 2024-03-13 DIAGNOSIS — I639 Cerebral infarction, unspecified: Secondary | ICD-10-CM | POA: Diagnosis not present

## 2024-03-13 DIAGNOSIS — R5383 Other fatigue: Secondary | ICD-10-CM | POA: Diagnosis not present

## 2024-03-13 DIAGNOSIS — E039 Hypothyroidism, unspecified: Secondary | ICD-10-CM | POA: Diagnosis not present

## 2024-03-13 DIAGNOSIS — E162 Hypoglycemia, unspecified: Secondary | ICD-10-CM | POA: Diagnosis not present

## 2024-03-13 DIAGNOSIS — R531 Weakness: Secondary | ICD-10-CM | POA: Diagnosis not present

## 2024-03-13 DIAGNOSIS — E871 Hypo-osmolality and hyponatremia: Secondary | ICD-10-CM | POA: Diagnosis not present

## 2024-03-13 DIAGNOSIS — J479 Bronchiectasis, uncomplicated: Secondary | ICD-10-CM | POA: Diagnosis not present

## 2024-03-13 DIAGNOSIS — R4182 Altered mental status, unspecified: Secondary | ICD-10-CM | POA: Diagnosis not present

## 2024-03-13 DIAGNOSIS — R001 Bradycardia, unspecified: Secondary | ICD-10-CM | POA: Diagnosis not present

## 2024-03-14 DIAGNOSIS — J479 Bronchiectasis, uncomplicated: Secondary | ICD-10-CM | POA: Diagnosis not present

## 2024-03-14 DIAGNOSIS — K5909 Other constipation: Secondary | ICD-10-CM | POA: Diagnosis not present

## 2024-03-14 DIAGNOSIS — R001 Bradycardia, unspecified: Secondary | ICD-10-CM | POA: Diagnosis not present

## 2024-03-14 DIAGNOSIS — R531 Weakness: Secondary | ICD-10-CM | POA: Diagnosis not present

## 2024-03-14 DIAGNOSIS — E039 Hypothyroidism, unspecified: Secondary | ICD-10-CM | POA: Diagnosis not present

## 2024-03-14 DIAGNOSIS — E162 Hypoglycemia, unspecified: Secondary | ICD-10-CM | POA: Diagnosis not present

## 2024-03-14 DIAGNOSIS — R4182 Altered mental status, unspecified: Secondary | ICD-10-CM | POA: Diagnosis not present

## 2024-03-14 DIAGNOSIS — E871 Hypo-osmolality and hyponatremia: Secondary | ICD-10-CM | POA: Diagnosis not present

## 2024-03-15 DIAGNOSIS — R001 Bradycardia, unspecified: Secondary | ICD-10-CM | POA: Diagnosis not present

## 2024-03-15 DIAGNOSIS — R531 Weakness: Secondary | ICD-10-CM | POA: Diagnosis not present

## 2024-03-15 DIAGNOSIS — E871 Hypo-osmolality and hyponatremia: Secondary | ICD-10-CM | POA: Diagnosis not present

## 2024-03-15 DIAGNOSIS — E039 Hypothyroidism, unspecified: Secondary | ICD-10-CM | POA: Diagnosis not present

## 2024-03-15 DIAGNOSIS — E162 Hypoglycemia, unspecified: Secondary | ICD-10-CM | POA: Diagnosis not present

## 2024-03-16 DIAGNOSIS — Q438 Other specified congenital malformations of intestine: Secondary | ICD-10-CM | POA: Diagnosis not present

## 2024-03-16 DIAGNOSIS — K2289 Other specified disease of esophagus: Secondary | ICD-10-CM | POA: Diagnosis not present

## 2024-03-16 DIAGNOSIS — K222 Esophageal obstruction: Secondary | ICD-10-CM | POA: Diagnosis not present

## 2024-03-16 DIAGNOSIS — I083 Combined rheumatic disorders of mitral, aortic and tricuspid valves: Secondary | ICD-10-CM | POA: Diagnosis not present

## 2024-03-16 DIAGNOSIS — K2210 Ulcer of esophagus without bleeding: Secondary | ICD-10-CM | POA: Diagnosis not present

## 2024-03-16 DIAGNOSIS — R531 Weakness: Secondary | ICD-10-CM | POA: Diagnosis not present

## 2024-03-16 DIAGNOSIS — E039 Hypothyroidism, unspecified: Secondary | ICD-10-CM | POA: Diagnosis not present

## 2024-03-16 DIAGNOSIS — K449 Diaphragmatic hernia without obstruction or gangrene: Secondary | ICD-10-CM | POA: Diagnosis not present

## 2024-03-16 DIAGNOSIS — R1310 Dysphagia, unspecified: Secondary | ICD-10-CM | POA: Diagnosis not present

## 2024-03-16 DIAGNOSIS — J849 Interstitial pulmonary disease, unspecified: Secondary | ICD-10-CM | POA: Diagnosis not present

## 2024-03-16 DIAGNOSIS — J45909 Unspecified asthma, uncomplicated: Secondary | ICD-10-CM | POA: Diagnosis not present

## 2024-03-16 DIAGNOSIS — R001 Bradycardia, unspecified: Secondary | ICD-10-CM | POA: Diagnosis not present

## 2024-03-16 DIAGNOSIS — R933 Abnormal findings on diagnostic imaging of other parts of digestive tract: Secondary | ICD-10-CM | POA: Diagnosis not present

## 2024-03-16 DIAGNOSIS — D509 Iron deficiency anemia, unspecified: Secondary | ICD-10-CM | POA: Diagnosis not present

## 2024-03-16 DIAGNOSIS — R634 Abnormal weight loss: Secondary | ICD-10-CM | POA: Diagnosis not present

## 2024-03-16 DIAGNOSIS — Z87891 Personal history of nicotine dependence: Secondary | ICD-10-CM | POA: Diagnosis not present

## 2024-03-16 DIAGNOSIS — E162 Hypoglycemia, unspecified: Secondary | ICD-10-CM | POA: Diagnosis not present

## 2024-03-16 DIAGNOSIS — R1319 Other dysphagia: Secondary | ICD-10-CM | POA: Diagnosis not present

## 2024-03-16 DIAGNOSIS — K5700 Diverticulitis of small intestine with perforation and abscess without bleeding: Secondary | ICD-10-CM | POA: Diagnosis not present

## 2024-03-16 DIAGNOSIS — E871 Hypo-osmolality and hyponatremia: Secondary | ICD-10-CM | POA: Diagnosis not present

## 2024-03-17 DIAGNOSIS — K2289 Other specified disease of esophagus: Secondary | ICD-10-CM | POA: Diagnosis not present

## 2024-03-17 DIAGNOSIS — K222 Esophageal obstruction: Secondary | ICD-10-CM | POA: Diagnosis not present

## 2024-03-17 DIAGNOSIS — R1310 Dysphagia, unspecified: Secondary | ICD-10-CM | POA: Diagnosis not present

## 2024-03-17 DIAGNOSIS — E871 Hypo-osmolality and hyponatremia: Secondary | ICD-10-CM | POA: Diagnosis not present

## 2024-03-17 DIAGNOSIS — G459 Transient cerebral ischemic attack, unspecified: Secondary | ICD-10-CM | POA: Diagnosis not present

## 2024-03-17 DIAGNOSIS — K2210 Ulcer of esophagus without bleeding: Secondary | ICD-10-CM | POA: Diagnosis not present

## 2024-03-17 DIAGNOSIS — D509 Iron deficiency anemia, unspecified: Secondary | ICD-10-CM | POA: Diagnosis not present

## 2024-03-17 DIAGNOSIS — Q438 Other specified congenital malformations of intestine: Secondary | ICD-10-CM | POA: Diagnosis not present

## 2024-03-17 DIAGNOSIS — R531 Weakness: Secondary | ICD-10-CM | POA: Diagnosis not present

## 2024-03-17 DIAGNOSIS — E46 Unspecified protein-calorie malnutrition: Secondary | ICD-10-CM | POA: Diagnosis not present

## 2024-03-17 DIAGNOSIS — R933 Abnormal findings on diagnostic imaging of other parts of digestive tract: Secondary | ICD-10-CM | POA: Diagnosis not present

## 2024-03-17 DIAGNOSIS — K259 Gastric ulcer, unspecified as acute or chronic, without hemorrhage or perforation: Secondary | ICD-10-CM | POA: Diagnosis not present

## 2024-03-17 DIAGNOSIS — G9341 Metabolic encephalopathy: Secondary | ICD-10-CM | POA: Diagnosis not present

## 2024-03-17 DIAGNOSIS — K449 Diaphragmatic hernia without obstruction or gangrene: Secondary | ICD-10-CM | POA: Diagnosis not present

## 2024-03-17 DIAGNOSIS — R634 Abnormal weight loss: Secondary | ICD-10-CM | POA: Diagnosis not present

## 2024-03-17 DIAGNOSIS — R001 Bradycardia, unspecified: Secondary | ICD-10-CM | POA: Diagnosis not present

## 2024-03-18 DIAGNOSIS — E871 Hypo-osmolality and hyponatremia: Secondary | ICD-10-CM | POA: Diagnosis not present

## 2024-03-18 DIAGNOSIS — R001 Bradycardia, unspecified: Secondary | ICD-10-CM | POA: Diagnosis not present

## 2024-03-18 DIAGNOSIS — R531 Weakness: Secondary | ICD-10-CM | POA: Diagnosis not present

## 2024-03-18 DIAGNOSIS — R1310 Dysphagia, unspecified: Secondary | ICD-10-CM | POA: Diagnosis not present

## 2024-03-19 DIAGNOSIS — K2210 Ulcer of esophagus without bleeding: Secondary | ICD-10-CM | POA: Diagnosis not present

## 2024-03-27 DIAGNOSIS — R531 Weakness: Secondary | ICD-10-CM | POA: Diagnosis not present

## 2024-03-27 DIAGNOSIS — Z79899 Other long term (current) drug therapy: Secondary | ICD-10-CM | POA: Diagnosis not present

## 2024-03-27 DIAGNOSIS — R262 Difficulty in walking, not elsewhere classified: Secondary | ICD-10-CM | POA: Diagnosis not present

## 2024-03-27 DIAGNOSIS — I69992 Facial weakness following unspecified cerebrovascular disease: Secondary | ICD-10-CM | POA: Diagnosis not present

## 2024-03-31 ENCOUNTER — Ambulatory Visit: Admitting: Family Medicine

## 2024-04-27 ENCOUNTER — Ambulatory Visit: Payer: Self-pay

## 2024-04-27 NOTE — Telephone Encounter (Addendum)
 Please call number [phone], for daughter Almarie. Pt is having slurred speech, advised to seek ED evaluation, pt daughter states, he is of sound mind and does not want to go to the emergency room, he has already been there. RN confirmed that pt has not been to the ED since slurred speech started pt was seen in the ED per daughter about a month ago.  Slurred speech, per daughter started about 48 hrs ago. RN advised pt to go to the ED, daughter refused.  Copied from CRM [id number]. Topic: Clinical - Red Word Triage >> Apr 27, 2024  2:01 PM Avram MATSU wrote: Red Word that prompted transfer to Nurse Triage: congnitive issue   ----------------------------------------------------------------------- From previous Reason for Contact - Scheduling: Patient/patient representative is calling to schedule an appointment. Refer to attachments for appointment information.

## 2024-04-28 ENCOUNTER — Other Ambulatory Visit: Payer: Self-pay

## 2024-06-28 DIAGNOSIS — E872 Acidosis, unspecified: Secondary | ICD-10-CM | POA: Diagnosis not present

## 2024-06-28 DIAGNOSIS — N179 Acute kidney failure, unspecified: Secondary | ICD-10-CM | POA: Diagnosis not present

## 2024-06-28 DIAGNOSIS — R079 Chest pain, unspecified: Secondary | ICD-10-CM | POA: Diagnosis not present

## 2024-06-28 DIAGNOSIS — R627 Adult failure to thrive: Secondary | ICD-10-CM | POA: Diagnosis not present

## 2024-06-28 DIAGNOSIS — J90 Pleural effusion, not elsewhere classified: Secondary | ICD-10-CM | POA: Diagnosis not present

## 2024-06-28 DIAGNOSIS — J45909 Unspecified asthma, uncomplicated: Secondary | ICD-10-CM | POA: Diagnosis not present

## 2024-06-29 DIAGNOSIS — R627 Adult failure to thrive: Secondary | ICD-10-CM | POA: Diagnosis not present

## 2024-06-29 DIAGNOSIS — N179 Acute kidney failure, unspecified: Secondary | ICD-10-CM | POA: Diagnosis not present

## 2024-06-29 DIAGNOSIS — E43 Unspecified severe protein-calorie malnutrition: Secondary | ICD-10-CM | POA: Diagnosis not present

## 2024-06-29 DIAGNOSIS — E860 Dehydration: Secondary | ICD-10-CM | POA: Diagnosis not present

## 2024-06-29 DIAGNOSIS — K2970 Gastritis, unspecified, without bleeding: Secondary | ICD-10-CM | POA: Diagnosis not present

## 2024-06-29 DIAGNOSIS — R1310 Dysphagia, unspecified: Secondary | ICD-10-CM | POA: Diagnosis not present

## 2024-06-30 DIAGNOSIS — J181 Lobar pneumonia, unspecified organism: Secondary | ICD-10-CM | POA: Diagnosis not present

## 2024-06-30 DIAGNOSIS — R933 Abnormal findings on diagnostic imaging of other parts of digestive tract: Secondary | ICD-10-CM | POA: Diagnosis not present

## 2024-06-30 DIAGNOSIS — K3189 Other diseases of stomach and duodenum: Secondary | ICD-10-CM | POA: Diagnosis not present

## 2024-06-30 DIAGNOSIS — J45901 Unspecified asthma with (acute) exacerbation: Secondary | ICD-10-CM | POA: Diagnosis not present

## 2024-06-30 DIAGNOSIS — Z7401 Bed confinement status: Secondary | ICD-10-CM | POA: Diagnosis not present

## 2024-06-30 DIAGNOSIS — R410 Disorientation, unspecified: Secondary | ICD-10-CM | POA: Diagnosis not present

## 2024-06-30 DIAGNOSIS — Z681 Body mass index (BMI) 19 or less, adult: Secondary | ICD-10-CM | POA: Diagnosis not present

## 2024-06-30 DIAGNOSIS — R1310 Dysphagia, unspecified: Secondary | ICD-10-CM | POA: Diagnosis not present

## 2024-06-30 DIAGNOSIS — E43 Unspecified severe protein-calorie malnutrition: Secondary | ICD-10-CM | POA: Diagnosis not present

## 2024-06-30 DIAGNOSIS — K2210 Ulcer of esophagus without bleeding: Secondary | ICD-10-CM | POA: Diagnosis not present

## 2024-06-30 DIAGNOSIS — R918 Other nonspecific abnormal finding of lung field: Secondary | ICD-10-CM | POA: Diagnosis not present

## 2024-06-30 DIAGNOSIS — J90 Pleural effusion, not elsewhere classified: Secondary | ICD-10-CM | POA: Diagnosis not present

## 2024-06-30 DIAGNOSIS — K254 Chronic or unspecified gastric ulcer with hemorrhage: Secondary | ICD-10-CM | POA: Diagnosis not present

## 2024-06-30 DIAGNOSIS — R638 Other symptoms and signs concerning food and fluid intake: Secondary | ICD-10-CM | POA: Diagnosis not present

## 2024-06-30 DIAGNOSIS — K5289 Other specified noninfective gastroenteritis and colitis: Secondary | ICD-10-CM | POA: Diagnosis not present

## 2024-06-30 DIAGNOSIS — K209 Esophagitis, unspecified without bleeding: Secondary | ICD-10-CM | POA: Diagnosis not present

## 2024-06-30 DIAGNOSIS — R627 Adult failure to thrive: Secondary | ICD-10-CM | POA: Diagnosis not present

## 2024-06-30 DIAGNOSIS — R079 Chest pain, unspecified: Secondary | ICD-10-CM | POA: Diagnosis not present

## 2024-06-30 DIAGNOSIS — J1569 Pneumonia due to other gram-negative bacteria: Secondary | ICD-10-CM | POA: Diagnosis not present

## 2024-06-30 DIAGNOSIS — A419 Sepsis, unspecified organism: Secondary | ICD-10-CM | POA: Diagnosis not present

## 2024-06-30 DIAGNOSIS — E039 Hypothyroidism, unspecified: Secondary | ICD-10-CM | POA: Diagnosis not present

## 2024-06-30 DIAGNOSIS — K222 Esophageal obstruction: Secondary | ICD-10-CM | POA: Diagnosis not present

## 2024-06-30 DIAGNOSIS — J9601 Acute respiratory failure with hypoxia: Secondary | ICD-10-CM | POA: Diagnosis not present

## 2024-06-30 DIAGNOSIS — R7989 Other specified abnormal findings of blood chemistry: Secondary | ICD-10-CM | POA: Diagnosis not present

## 2024-06-30 DIAGNOSIS — D509 Iron deficiency anemia, unspecified: Secondary | ICD-10-CM | POA: Diagnosis not present

## 2024-06-30 DIAGNOSIS — R846 Abnormal cytological findings in specimens from respiratory organs and thorax: Secondary | ICD-10-CM | POA: Diagnosis not present

## 2024-06-30 DIAGNOSIS — K2211 Ulcer of esophagus with bleeding: Secondary | ICD-10-CM | POA: Diagnosis not present

## 2024-06-30 DIAGNOSIS — Z09 Encounter for follow-up examination after completed treatment for conditions other than malignant neoplasm: Secondary | ICD-10-CM | POA: Diagnosis not present

## 2024-06-30 DIAGNOSIS — K529 Noninfective gastroenteritis and colitis, unspecified: Secondary | ICD-10-CM | POA: Diagnosis not present

## 2024-06-30 DIAGNOSIS — R042 Hemoptysis: Secondary | ICD-10-CM | POA: Diagnosis not present

## 2024-06-30 DIAGNOSIS — R7982 Elevated C-reactive protein (CRP): Secondary | ICD-10-CM | POA: Diagnosis not present

## 2024-06-30 DIAGNOSIS — D649 Anemia, unspecified: Secondary | ICD-10-CM | POA: Diagnosis not present

## 2024-06-30 DIAGNOSIS — E860 Dehydration: Secondary | ICD-10-CM | POA: Diagnosis not present

## 2024-06-30 DIAGNOSIS — R195 Other fecal abnormalities: Secondary | ICD-10-CM | POA: Diagnosis not present

## 2024-06-30 DIAGNOSIS — J860 Pyothorax with fistula: Secondary | ICD-10-CM | POA: Diagnosis not present

## 2024-06-30 DIAGNOSIS — Z452 Encounter for adjustment and management of vascular access device: Secondary | ICD-10-CM | POA: Diagnosis not present

## 2024-06-30 DIAGNOSIS — K2100 Gastro-esophageal reflux disease with esophagitis, without bleeding: Secondary | ICD-10-CM | POA: Diagnosis not present

## 2024-06-30 DIAGNOSIS — K219 Gastro-esophageal reflux disease without esophagitis: Secondary | ICD-10-CM | POA: Diagnosis not present

## 2024-06-30 DIAGNOSIS — R4189 Other symptoms and signs involving cognitive functions and awareness: Secondary | ICD-10-CM | POA: Diagnosis not present

## 2024-06-30 DIAGNOSIS — E876 Hypokalemia: Secondary | ICD-10-CM | POA: Diagnosis not present

## 2024-06-30 DIAGNOSIS — K225 Diverticulum of esophagus, acquired: Secondary | ICD-10-CM | POA: Diagnosis not present

## 2024-06-30 DIAGNOSIS — K5900 Constipation, unspecified: Secondary | ICD-10-CM | POA: Diagnosis not present

## 2024-06-30 DIAGNOSIS — N179 Acute kidney failure, unspecified: Secondary | ICD-10-CM | POA: Diagnosis not present

## 2024-06-30 DIAGNOSIS — K259 Gastric ulcer, unspecified as acute or chronic, without hemorrhage or perforation: Secondary | ICD-10-CM | POA: Diagnosis not present

## 2024-06-30 DIAGNOSIS — J189 Pneumonia, unspecified organism: Secondary | ICD-10-CM | POA: Diagnosis not present

## 2024-06-30 DIAGNOSIS — K449 Diaphragmatic hernia without obstruction or gangrene: Secondary | ICD-10-CM | POA: Diagnosis not present

## 2024-06-30 DIAGNOSIS — T17900A Unspecified foreign body in respiratory tract, part unspecified causing asphyxiation, initial encounter: Secondary | ICD-10-CM | POA: Diagnosis not present

## 2024-06-30 DIAGNOSIS — Y95 Nosocomial condition: Secondary | ICD-10-CM | POA: Diagnosis not present

## 2024-06-30 DIAGNOSIS — J690 Pneumonitis due to inhalation of food and vomit: Secondary | ICD-10-CM | POA: Diagnosis not present

## 2024-06-30 DIAGNOSIS — R0602 Shortness of breath: Secondary | ICD-10-CM | POA: Diagnosis not present

## 2024-07-01 DIAGNOSIS — K259 Gastric ulcer, unspecified as acute or chronic, without hemorrhage or perforation: Secondary | ICD-10-CM | POA: Diagnosis not present

## 2024-07-01 DIAGNOSIS — R627 Adult failure to thrive: Secondary | ICD-10-CM | POA: Diagnosis not present

## 2024-07-01 DIAGNOSIS — R933 Abnormal findings on diagnostic imaging of other parts of digestive tract: Secondary | ICD-10-CM | POA: Diagnosis not present

## 2024-07-01 DIAGNOSIS — R1310 Dysphagia, unspecified: Secondary | ICD-10-CM | POA: Diagnosis not present

## 2024-07-01 DIAGNOSIS — K2210 Ulcer of esophagus without bleeding: Secondary | ICD-10-CM | POA: Diagnosis not present

## 2024-07-01 DIAGNOSIS — K3189 Other diseases of stomach and duodenum: Secondary | ICD-10-CM | POA: Diagnosis not present

## 2024-07-01 DIAGNOSIS — N179 Acute kidney failure, unspecified: Secondary | ICD-10-CM | POA: Diagnosis not present

## 2024-07-01 DIAGNOSIS — K5289 Other specified noninfective gastroenteritis and colitis: Secondary | ICD-10-CM | POA: Diagnosis not present

## 2024-07-01 DIAGNOSIS — E43 Unspecified severe protein-calorie malnutrition: Secondary | ICD-10-CM | POA: Diagnosis not present

## 2024-07-01 DIAGNOSIS — K5900 Constipation, unspecified: Secondary | ICD-10-CM | POA: Diagnosis not present

## 2024-07-01 DIAGNOSIS — E860 Dehydration: Secondary | ICD-10-CM | POA: Diagnosis not present

## 2024-07-02 DIAGNOSIS — E860 Dehydration: Secondary | ICD-10-CM | POA: Diagnosis not present

## 2024-07-02 DIAGNOSIS — N179 Acute kidney failure, unspecified: Secondary | ICD-10-CM | POA: Diagnosis not present

## 2024-07-02 DIAGNOSIS — K5900 Constipation, unspecified: Secondary | ICD-10-CM | POA: Diagnosis not present

## 2024-07-02 DIAGNOSIS — R933 Abnormal findings on diagnostic imaging of other parts of digestive tract: Secondary | ICD-10-CM | POA: Diagnosis not present

## 2024-07-02 DIAGNOSIS — K3189 Other diseases of stomach and duodenum: Secondary | ICD-10-CM | POA: Diagnosis not present

## 2024-07-02 DIAGNOSIS — E43 Unspecified severe protein-calorie malnutrition: Secondary | ICD-10-CM | POA: Diagnosis not present

## 2024-07-02 DIAGNOSIS — K259 Gastric ulcer, unspecified as acute or chronic, without hemorrhage or perforation: Secondary | ICD-10-CM | POA: Diagnosis not present

## 2024-07-02 DIAGNOSIS — R1310 Dysphagia, unspecified: Secondary | ICD-10-CM | POA: Diagnosis not present

## 2024-07-02 DIAGNOSIS — K2210 Ulcer of esophagus without bleeding: Secondary | ICD-10-CM | POA: Diagnosis not present

## 2024-07-02 DIAGNOSIS — R627 Adult failure to thrive: Secondary | ICD-10-CM | POA: Diagnosis not present

## 2024-07-02 DIAGNOSIS — K5289 Other specified noninfective gastroenteritis and colitis: Secondary | ICD-10-CM | POA: Diagnosis not present

## 2024-07-03 DIAGNOSIS — K5900 Constipation, unspecified: Secondary | ICD-10-CM | POA: Diagnosis not present

## 2024-07-03 DIAGNOSIS — R627 Adult failure to thrive: Secondary | ICD-10-CM | POA: Diagnosis not present

## 2024-07-03 DIAGNOSIS — K3189 Other diseases of stomach and duodenum: Secondary | ICD-10-CM | POA: Diagnosis not present

## 2024-07-03 DIAGNOSIS — R1310 Dysphagia, unspecified: Secondary | ICD-10-CM | POA: Diagnosis not present

## 2024-07-03 DIAGNOSIS — K259 Gastric ulcer, unspecified as acute or chronic, without hemorrhage or perforation: Secondary | ICD-10-CM | POA: Diagnosis not present

## 2024-07-03 DIAGNOSIS — K5289 Other specified noninfective gastroenteritis and colitis: Secondary | ICD-10-CM | POA: Diagnosis not present

## 2024-07-03 DIAGNOSIS — K2210 Ulcer of esophagus without bleeding: Secondary | ICD-10-CM | POA: Diagnosis not present

## 2024-07-03 DIAGNOSIS — R933 Abnormal findings on diagnostic imaging of other parts of digestive tract: Secondary | ICD-10-CM | POA: Diagnosis not present

## 2024-07-03 DIAGNOSIS — N179 Acute kidney failure, unspecified: Secondary | ICD-10-CM | POA: Diagnosis not present

## 2024-07-04 DIAGNOSIS — R627 Adult failure to thrive: Secondary | ICD-10-CM | POA: Diagnosis not present

## 2024-07-04 DIAGNOSIS — N179 Acute kidney failure, unspecified: Secondary | ICD-10-CM | POA: Diagnosis not present

## 2024-07-04 DIAGNOSIS — E876 Hypokalemia: Secondary | ICD-10-CM | POA: Diagnosis not present

## 2024-07-04 DIAGNOSIS — K529 Noninfective gastroenteritis and colitis, unspecified: Secondary | ICD-10-CM | POA: Diagnosis not present

## 2024-07-05 DIAGNOSIS — N179 Acute kidney failure, unspecified: Secondary | ICD-10-CM | POA: Diagnosis not present

## 2024-07-05 DIAGNOSIS — K529 Noninfective gastroenteritis and colitis, unspecified: Secondary | ICD-10-CM | POA: Diagnosis not present

## 2024-07-05 DIAGNOSIS — E876 Hypokalemia: Secondary | ICD-10-CM | POA: Diagnosis not present

## 2024-07-05 DIAGNOSIS — R627 Adult failure to thrive: Secondary | ICD-10-CM | POA: Diagnosis not present

## 2024-07-06 DIAGNOSIS — N179 Acute kidney failure, unspecified: Secondary | ICD-10-CM | POA: Diagnosis not present

## 2024-07-06 DIAGNOSIS — J181 Lobar pneumonia, unspecified organism: Secondary | ICD-10-CM | POA: Diagnosis not present

## 2024-07-06 DIAGNOSIS — R627 Adult failure to thrive: Secondary | ICD-10-CM | POA: Diagnosis not present

## 2024-07-06 DIAGNOSIS — E876 Hypokalemia: Secondary | ICD-10-CM | POA: Diagnosis not present

## 2024-07-06 DIAGNOSIS — K529 Noninfective gastroenteritis and colitis, unspecified: Secondary | ICD-10-CM | POA: Diagnosis not present

## 2024-07-07 DIAGNOSIS — E876 Hypokalemia: Secondary | ICD-10-CM | POA: Diagnosis not present

## 2024-07-07 DIAGNOSIS — J9601 Acute respiratory failure with hypoxia: Secondary | ICD-10-CM | POA: Diagnosis not present

## 2024-07-07 DIAGNOSIS — D649 Anemia, unspecified: Secondary | ICD-10-CM | POA: Diagnosis not present

## 2024-07-08 DIAGNOSIS — E876 Hypokalemia: Secondary | ICD-10-CM | POA: Diagnosis not present

## 2024-07-08 DIAGNOSIS — J9601 Acute respiratory failure with hypoxia: Secondary | ICD-10-CM | POA: Diagnosis not present

## 2024-07-08 DIAGNOSIS — D649 Anemia, unspecified: Secondary | ICD-10-CM | POA: Diagnosis not present

## 2024-07-09 DIAGNOSIS — D649 Anemia, unspecified: Secondary | ICD-10-CM | POA: Diagnosis not present

## 2024-07-09 DIAGNOSIS — E876 Hypokalemia: Secondary | ICD-10-CM | POA: Diagnosis not present

## 2024-07-09 DIAGNOSIS — J9601 Acute respiratory failure with hypoxia: Secondary | ICD-10-CM | POA: Diagnosis not present

## 2024-07-10 DIAGNOSIS — K2100 Gastro-esophageal reflux disease with esophagitis, without bleeding: Secondary | ICD-10-CM | POA: Diagnosis not present

## 2024-07-10 DIAGNOSIS — K2210 Ulcer of esophagus without bleeding: Secondary | ICD-10-CM | POA: Diagnosis not present

## 2024-07-10 DIAGNOSIS — D649 Anemia, unspecified: Secondary | ICD-10-CM | POA: Diagnosis not present

## 2024-07-10 DIAGNOSIS — K222 Esophageal obstruction: Secondary | ICD-10-CM | POA: Diagnosis not present

## 2024-07-10 DIAGNOSIS — E876 Hypokalemia: Secondary | ICD-10-CM | POA: Diagnosis not present

## 2024-07-10 DIAGNOSIS — J9601 Acute respiratory failure with hypoxia: Secondary | ICD-10-CM | POA: Diagnosis not present

## 2024-07-10 DIAGNOSIS — K449 Diaphragmatic hernia without obstruction or gangrene: Secondary | ICD-10-CM | POA: Diagnosis not present

## 2024-07-11 DIAGNOSIS — J9601 Acute respiratory failure with hypoxia: Secondary | ICD-10-CM | POA: Diagnosis not present

## 2024-07-11 DIAGNOSIS — E876 Hypokalemia: Secondary | ICD-10-CM | POA: Diagnosis not present

## 2024-07-11 DIAGNOSIS — D649 Anemia, unspecified: Secondary | ICD-10-CM | POA: Diagnosis not present

## 2024-07-12 DIAGNOSIS — J9601 Acute respiratory failure with hypoxia: Secondary | ICD-10-CM | POA: Diagnosis not present

## 2024-07-12 DIAGNOSIS — D649 Anemia, unspecified: Secondary | ICD-10-CM | POA: Diagnosis not present

## 2024-07-12 DIAGNOSIS — E876 Hypokalemia: Secondary | ICD-10-CM | POA: Diagnosis not present

## 2024-07-13 DIAGNOSIS — E876 Hypokalemia: Secondary | ICD-10-CM | POA: Diagnosis not present

## 2024-07-13 DIAGNOSIS — J9601 Acute respiratory failure with hypoxia: Secondary | ICD-10-CM | POA: Diagnosis not present

## 2024-07-13 DIAGNOSIS — D649 Anemia, unspecified: Secondary | ICD-10-CM | POA: Diagnosis not present

## 2024-07-17 DIAGNOSIS — R918 Other nonspecific abnormal finding of lung field: Secondary | ICD-10-CM | POA: Diagnosis not present

## 2024-07-19 DIAGNOSIS — K222 Esophageal obstruction: Secondary | ICD-10-CM | POA: Diagnosis not present

## 2024-07-19 DIAGNOSIS — N179 Acute kidney failure, unspecified: Secondary | ICD-10-CM | POA: Diagnosis not present

## 2024-07-19 DIAGNOSIS — R627 Adult failure to thrive: Secondary | ICD-10-CM | POA: Diagnosis not present

## 2024-07-19 DIAGNOSIS — R1310 Dysphagia, unspecified: Secondary | ICD-10-CM | POA: Diagnosis not present

## 2024-07-21 DIAGNOSIS — R846 Abnormal cytological findings in specimens from respiratory organs and thorax: Secondary | ICD-10-CM | POA: Diagnosis not present

## 2024-07-24 DIAGNOSIS — I350 Nonrheumatic aortic (valve) stenosis: Secondary | ICD-10-CM | POA: Diagnosis not present

## 2024-07-24 DIAGNOSIS — J45909 Unspecified asthma, uncomplicated: Secondary | ICD-10-CM | POA: Diagnosis not present

## 2024-07-24 DIAGNOSIS — J90 Pleural effusion, not elsewhere classified: Secondary | ICD-10-CM | POA: Diagnosis not present

## 2024-07-24 DIAGNOSIS — M6281 Muscle weakness (generalized): Secondary | ICD-10-CM | POA: Diagnosis not present

## 2024-07-24 DIAGNOSIS — R109 Unspecified abdominal pain: Secondary | ICD-10-CM | POA: Diagnosis not present

## 2024-07-24 DIAGNOSIS — Q390 Atresia of esophagus without fistula: Secondary | ICD-10-CM | POA: Diagnosis not present

## 2024-07-24 DIAGNOSIS — Z87731 Personal history of (corrected) tracheoesophageal fistula or atresia: Secondary | ICD-10-CM | POA: Diagnosis not present

## 2024-07-24 DIAGNOSIS — Z48813 Encounter for surgical aftercare following surgery on the respiratory system: Secondary | ICD-10-CM | POA: Diagnosis not present

## 2024-07-24 DIAGNOSIS — K2100 Gastro-esophageal reflux disease with esophagitis, without bleeding: Secondary | ICD-10-CM | POA: Diagnosis not present

## 2024-07-24 DIAGNOSIS — R918 Other nonspecific abnormal finding of lung field: Secondary | ICD-10-CM | POA: Diagnosis not present

## 2024-07-24 DIAGNOSIS — R509 Fever, unspecified: Secondary | ICD-10-CM | POA: Diagnosis not present

## 2024-07-24 DIAGNOSIS — I3139 Other pericardial effusion (noninflammatory): Secondary | ICD-10-CM | POA: Diagnosis not present

## 2024-07-24 DIAGNOSIS — I6523 Occlusion and stenosis of bilateral carotid arteries: Secondary | ICD-10-CM | POA: Diagnosis not present

## 2024-07-24 DIAGNOSIS — K250 Acute gastric ulcer with hemorrhage: Secondary | ICD-10-CM | POA: Diagnosis not present

## 2024-07-24 DIAGNOSIS — Z681 Body mass index (BMI) 19 or less, adult: Secondary | ICD-10-CM | POA: Diagnosis not present

## 2024-07-24 DIAGNOSIS — T17998A Other foreign object in respiratory tract, part unspecified causing other injury, initial encounter: Secondary | ICD-10-CM | POA: Diagnosis not present

## 2024-07-24 DIAGNOSIS — J9601 Acute respiratory failure with hypoxia: Secondary | ICD-10-CM | POA: Diagnosis not present

## 2024-07-24 DIAGNOSIS — R1310 Dysphagia, unspecified: Secondary | ICD-10-CM | POA: Diagnosis not present

## 2024-07-24 DIAGNOSIS — R4189 Other symptoms and signs involving cognitive functions and awareness: Secondary | ICD-10-CM | POA: Diagnosis not present

## 2024-07-24 DIAGNOSIS — R5381 Other malaise: Secondary | ICD-10-CM | POA: Diagnosis not present

## 2024-07-24 DIAGNOSIS — K219 Gastro-esophageal reflux disease without esophagitis: Secondary | ICD-10-CM | POA: Diagnosis not present

## 2024-07-24 DIAGNOSIS — Z95828 Presence of other vascular implants and grafts: Secondary | ICD-10-CM | POA: Diagnosis not present

## 2024-07-24 DIAGNOSIS — D649 Anemia, unspecified: Secondary | ICD-10-CM | POA: Diagnosis not present

## 2024-07-24 DIAGNOSIS — J690 Pneumonitis due to inhalation of food and vomit: Secondary | ICD-10-CM | POA: Diagnosis not present

## 2024-07-24 DIAGNOSIS — K2211 Ulcer of esophagus with bleeding: Secondary | ICD-10-CM | POA: Diagnosis not present

## 2024-07-24 DIAGNOSIS — J9600 Acute respiratory failure, unspecified whether with hypoxia or hypercapnia: Secondary | ICD-10-CM | POA: Diagnosis not present

## 2024-07-24 DIAGNOSIS — E538 Deficiency of other specified B group vitamins: Secondary | ICD-10-CM | POA: Diagnosis not present

## 2024-07-24 DIAGNOSIS — T17320A Food in larynx causing asphyxiation, initial encounter: Secondary | ICD-10-CM | POA: Diagnosis not present

## 2024-07-24 DIAGNOSIS — Z452 Encounter for adjustment and management of vascular access device: Secondary | ICD-10-CM | POA: Diagnosis not present

## 2024-07-24 DIAGNOSIS — K209 Esophagitis, unspecified without bleeding: Secondary | ICD-10-CM | POA: Diagnosis not present

## 2024-07-24 DIAGNOSIS — E039 Hypothyroidism, unspecified: Secondary | ICD-10-CM | POA: Diagnosis not present

## 2024-07-24 DIAGNOSIS — R4182 Altered mental status, unspecified: Secondary | ICD-10-CM | POA: Diagnosis not present

## 2024-07-24 DIAGNOSIS — J189 Pneumonia, unspecified organism: Secondary | ICD-10-CM | POA: Diagnosis not present

## 2024-07-24 DIAGNOSIS — I6521 Occlusion and stenosis of right carotid artery: Secondary | ICD-10-CM | POA: Diagnosis not present

## 2024-07-24 DIAGNOSIS — A419 Sepsis, unspecified organism: Secondary | ICD-10-CM | POA: Diagnosis not present

## 2024-07-24 DIAGNOSIS — J860 Pyothorax with fistula: Secondary | ICD-10-CM | POA: Diagnosis not present

## 2024-07-24 DIAGNOSIS — Z515 Encounter for palliative care: Secondary | ICD-10-CM | POA: Diagnosis not present

## 2024-07-24 DIAGNOSIS — R7401 Elevation of levels of liver transaminase levels: Secondary | ICD-10-CM | POA: Diagnosis not present

## 2024-07-24 DIAGNOSIS — K2101 Gastro-esophageal reflux disease with esophagitis, with bleeding: Secondary | ICD-10-CM | POA: Diagnosis not present

## 2024-07-24 DIAGNOSIS — R059 Cough, unspecified: Secondary | ICD-10-CM | POA: Diagnosis not present

## 2024-07-24 DIAGNOSIS — K2210 Ulcer of esophagus without bleeding: Secondary | ICD-10-CM | POA: Diagnosis not present

## 2024-07-24 DIAGNOSIS — J45901 Unspecified asthma with (acute) exacerbation: Secondary | ICD-10-CM | POA: Diagnosis not present

## 2024-07-24 DIAGNOSIS — I6503 Occlusion and stenosis of bilateral vertebral arteries: Secondary | ICD-10-CM | POA: Diagnosis not present

## 2024-07-24 DIAGNOSIS — E43 Unspecified severe protein-calorie malnutrition: Secondary | ICD-10-CM | POA: Diagnosis not present

## 2024-07-24 DIAGNOSIS — J9504 Tracheo-esophageal fistula following tracheostomy: Secondary | ICD-10-CM | POA: Diagnosis not present

## 2024-07-27 ENCOUNTER — Telehealth: Payer: Self-pay | Admitting: *Deleted

## 2024-07-27 NOTE — Transitions of Care (Post Inpatient/ED Visit) (Signed)
   07/27/2024  Name: Lee Wilson MRN: [id number] DOB: 02/29/1946  Today's TOC FU Call Status: Today's TOC FU Call Status:: Unsuccessful Call (1st Attempt) Unsuccessful Call (1st Attempt) Date: 07/27/24  Attempted to reach the patient regarding the most recent Inpatient/ED visit.  Follow Up Plan: Additional outreach attempts will be made to reach the patient to complete the Transitions of Care (Post Inpatient/ED visit) call.   Mliss Creed [facility], BSN RN Care Manager/ Transition of Care [place]/ [facility] [phone]

## 2024-07-28 ENCOUNTER — Telehealth: Payer: Self-pay | Admitting: *Deleted

## 2024-07-28 NOTE — Transitions of Care (Post Inpatient/ED Visit) (Signed)
   07/28/2024  Name: Lee Wilson MRN: [id number] DOB: 1946-09-16  Today's TOC FU Call Status: Today's TOC FU Call Status:: Unsuccessful Call (2nd Attempt) Unsuccessful Call (2nd Attempt) Date: 07/28/24  Attempted to reach the patient regarding the most recent Inpatient/ED visit.  Follow Up Plan: Additional outreach attempts will be made to reach the patient to complete the Transitions of Care (Post Inpatient/ED visit) call.   Mliss Creed [facility], BSN RN Care Manager/ Transition of Care [place]/ [facility] [phone]

## 2024-07-28 NOTE — Discharge Summary (Signed)
 [facility]  Discharge Summary  PCP: Eliezer Speedy, MD  Discharge Details   Admit date:        07/24/2024 Discharge date and time:     09/01/2024 Hospital LOS:    40 days  Active Hospital Problems   Diagnosis Date Noted POA  . 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 Peripheral arterial disease 09/01/2024 Unknown  . 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 Acute hypoxic respiratory failure (*) 07/24/2024 Yes  . Aspiration pneumonia (*) 07/24/2024 Yes  . Gastroesophageal reflux disease with esophagitis without hemorrhage 07/24/2024 Yes  . Asthma with exacerbation (*) 07/24/2024 Yes  . Cognitive impairment 07/24/2024 Yes  . Severe protein-calorie malnutrition (*) 07/24/2024 Yes  . Hypothyroidism 07/24/2024 Yes    Resolved Hospital Problems  No resolved problems to display.       Current Discharge Medication List     PAUSE taking these medications as directed      Details  atorvastatin  80 mg tablet Wait to take this until your doctor or other care provider tells you to start again. Commonly known as: LIPITOR  Take one tablet (80 mg dose) by mouth at bedtime. Pause reason: Other (Comment) Pause comment: Abnormal LFTs.  PCP to determine when to resume statin.       START taking these medications      Details  clotrimazole-betamethasone cream Commonly known as: LOTRISONE  Apply topically 2 (two) times daily. Quantity: 30 g   dextromethorphan-guaiFENesin 30-600 mg per 12 hr tablet Commonly known as: MUCINEX DM  Take one tablet by mouth every 12 (twelve) hours as needed for up to 10 days. Quantity: 20 tablet   LORAzepam 0.5 mg tablet Commonly known as: ATIVAN  Take one tablet (0.5 mg dose) by mouth every 4 (four) hours as needed for Anxiety for up to 3 days. Max Daily Amount: 3 mg Quantity: 18 tablet   melatonin 3 mg Tabs  Take one tablet (3 mg dose) by mouth at bedtime as needed  (insomnia). Quantity: 30 tablet   morphine sulfate 20 mg/mL concentrated solution  Take 0.25 mLs (5 mg dose) by mouth every 2 (two) hours as needed for up to 3 days. Max Daily Amount: 60 mg Quantity: 9 mL   naloxone nasal spray (TAKE HOME PACK) Commonly known as: NARCAN  one spray by Intranasal route as needed for Opioid Reversal (Opioid Reversal) for up to 14 days. Call 911. Instill one spray in each nostril. Repeat every 3 minutes as needed if no or minimal response. Quantity: 2 each       CONTINUE these medications which have NOT CHANGED      Details  albuterol  sulfate HFA 108 (90 Base) MCG/ACT inhaler Commonly known as: PROVENTIL ,VENTOLIN ,PROAIR   Inhale two puffs into the lungs every 4 (four) hours as needed for Wheezing.   AMITIZA 24 mcg capsule Generic drug: lubiprostone  Take one capsule (24 mcg dose) by mouth 2 (two) times daily with meals.   aspirin 81 mg chewable tablet  Chew one tablet (81 mg dose) by mouth at noon. Take with meals..   B-12 1000 MCG Tabs  Take 1,000 mcg by mouth every morning.   CENTRUM SILVER 50+MEN Tabs  Take 1 tablet by mouth every morning.   ENSURE  Take 237 mLs by mouth daily.   famotidine  40 mg tablet Commonly known as: PEPCID   Take one tablet (40 mg dose) by mouth every morning.   hydrocortisone  2.5 % cream  Apply one Application topically 2 (two) times daily. APPLY TO NECK, ARMS AND CHEST   levothyroxine  sodium 88 mcg tablet Commonly known as: SYNTHROID ,LEVOTHROID,LEVOXYL   Take one tablet (88 mcg dose) by mouth 1 (one) time each day before breakfast.   pantoprazole  sodium 40 mg tablet Commonly known as: PROTONIX   Take one tablet (40 mg dose) by mouth 2 (two) times daily before meals. Quantity: 60 tablet   sucralfate  1 g tablet Commonly known as: CARAFATE   Take one tablet (1 g dose) by mouth at bedtime. Quantity: 60 tablet      * You might also be taking other medications not listed above. If you have questions about any  of your other medications, talk to the person who prescribed them or your Primary Care Provider.           Wound Orders (From admission, onward)     Start       08/20/24 2100  WOUND CARE     Comments: Medial upper thighs, scrotum/perineum,bilateral groin and Peri-rectum: Clean areas with cleanser spray. Apply lotrisone cream to reddened areas.  Cover with moisture barrier ointment. APPLY Q12 hours.  Order Comments: Medial upper thighs, scrotum/perineum,bilateral groin and Peri-rectum: Clean areas with cleanser spray. Apply lotrisone cream to reddened areas.  Cover with moisture barrier ointment. APPLY Q12 hours.        08/20/24 2100  WOUND CARE     Comments: Buttocks and gluteal crease: cleanse with spray cleanser, pat dry. Apply protective ointment to intact skin *surrounding* wound. Apply ANASEPT gel to WOUND BED, then Cover wound bed with saline moist gauze. (unfold and fluff gauze and limit to wound bed), then Cover with unfolded vaseline gauze and abd pad. Secure with paper tape.  *Change Q12 hours and PRN soilage*  Order Comments: Buttocks and gluteal crease: cleanse with spray cleanser, pat dry. Apply protective ointment to intact skin *surrounding* wound. Apply ANASEPT gel to WOUND BED, then Cover wound bed with saline moist gauze. (unfold and fluff gauze and limit to wound bed), then Cover with unfolded vaseline gauze and abd pad. Secure with paper tape.  *Change Q12 hours and PRN soilage*        08/20/24 2100  WOUND CARE     Comments: Reconsult wound/ostomy should appearance of skin/wound differ from previous wound care assessment on 08/20/24  Order Comments: Reconsult wound/ostomy should appearance of skin/wound differ from previous wound care assessment on 08/20/24        07/29/24 2000  WOUND CARE     Comments: Bilateral Hips, Heels and Right medial foot: Clean areas with moisturizer cleanser spray. Apply moisture barrier ointment liberally to areas(blue ointment).  **Cover  discolored area on right medial foot with unfolded vaseline gauze and abd pad, then wrap Right foot from base of toes to right ankle with kerlix. APPLY BID AND PRN TURNS.  Order Comments: Bilateral Hips, Heels and Right medial foot: Clean areas with moisturizer cleanser spray. Apply moisture barrier ointment liberally to areas(blue ointment).  **Cover discolored area on right medial foot with unfolded vaseline gauze and abd pad, then wrap Right foot from base of toes to right ankle with kerlix. APPLY BID AND PRN TURNS.                  Diet and Nourishment Orders (From admission, onward)     Start       07/24/24 1721  NPO time specified  Diet effective now  DME Orders (From admission, onward)            Arrange for Home Use DME Manual Hospital Bed  (Arrange for Home Use Only DME Manual Hospital Bed )  Once (Routine)       Question Answer Comment  Bed Accessories: Gel Overlay   Height 1.626 m (5' 4)   Weight (kg) 53.1     Placed in And Linked Group      Home DME Biomedical Scientist  (Arrange for Home Use Only DME Biomedical Scientist)  Once (Routine)       Question Answer Comment  Wheelchair Accessories: Wheel Locks   Wheelchair Accessories: Anti-Tippers   Wheelchair Accessories: Seat Cushion   Height 1.626 m (5' 4)   Weight (kg) 53.1     Placed in And Linked Group      Arrange for Home Use Only DME Bedside Commode / Type: Drop Arm; Drop Arm Medical Necessity: Requires the detachable arms feature to facilitate transfers and patient body configuration requires extra width  Once (Routine)       Question Answer Comment  Type: Drop Arm   Drop Arm Medical Necessity Requires the detachable arms feature to facilitate transfers and patient body configuration requires extra width   Medical Necessity Patient has mobility limitation and is confined to a single room and/or level in the home without a commode   Medical Necessity Patient is  physically incapable of utilizing or ambulating to a regular toilet   Height 1.626 m (5' 4)   Weight (kg) 53.1                Home Health Agency     None       Hospital Course  Chief Complaint: pneumonia  Hospital Course:  Lee Wilson is a 78 y.o. male with past medical history of asthma and GERD who initially presented to [facility] on 06/29/24 with SOB and CP. He was found to have PNA and he was treated with multiple courses of antibiotics. He also complained of dysphagia there and underwent EGD x 2. He was found to have esophageal/gastric ulcers, esophagitis, and concern for fistula. He then had a CT esophagram on 10/6 that showed tracheoesophageal fistula, and he was transferred to [facility] for consideration of esophageal stenting on 10/10.    The day of the discharge, patient denies any shortness of breath or chest pain or chest pressure or headache or dizziness or lightheadedness either.  No nausea vomiting or abdominal pain.  No cough.  Vital signs are stable while in room air.  No agitation or delirium or hallucinations either. General: NAD, chronically ill looking. HEENT: PERRLA, EOMI Neck: Supple, no JVD or carotid bruit Cardiovascular: RRR, 1+ LE edema in his feet, trace LE edema in his lower leg.  No calf tenderness. Respiratory: Good air entry bilaterally, no rales or rhonchi or crackles, no wheezing, normal WOB on RA  Abdomen: SNTND.  No guarding or rebound tenderness.  Murphy sign is negative.  Bowel sounds present. Extremities: Good peripheral pulses Skin: 2.5 cm skin ulcer left buttock, no significant surrounding erythema.  No local significant discharges either.  Wound ostomy consult requested. Neuro: A&O to person, place, and situation.  Moves upper and lower extremities with no obvious deficit.  No photophobia. Psychiatric: Normal affect.  No ongoing depression symptoms or thoughts of hopelessness.  Tracheoesophageal fistula c/b recurrent  aspiration PNA Acute hypoxic respiratory failure: CT chest on arrival to OSH with bilateral pleural effusions,  as well as, PNA. He received multiple courses of abx at the OSH for his PNA prior to transfer, including azithromycin , ceftriaxone, zosyn, and meropenem. He underwent MBS on 10/1 that showed no evidence of aspiration. He also underwent bronchoscopy on 10/3 showing copious thin secretions. Per the OSH records, it seems cultures only grew yeast. He had an EGD while there on 9/16 that was concerning for esophageal fistula. He then underwent CT esophogram on 10/6 that showed tracheoesophageal fistula with aspiration of contrast into the lungs. He was transferred to [facility] for consideration of esophageal stenting.  - GI and pulm consulted  - He underwent EGD on 10/13 here showing severely friable nodular esophagus with visible bronchial esophageal fistulous opening in the distal esophagus near the GE junction, esophageal stent was placed  - S/p esophagram on 10/14 showing no esophageal fistula or extravasation of contrast  - S/p repeat esophagram on 10/20 showing patent esophageal stent - He completed course of Zosyn here 10/10-10/17 for PNA  - He then developed recurrent fever to 101.49F on 10/26 with worsening leukocytosis of 11.4 and pro-cal 0.86, CXR with worsening opacities in the R>L, fever/sepsis suspected to be from recurrent aspiration event, started back on IV cefepime at that time with improvement in leukocytosis and fever curve  - CT chest repeated on 10/28 -> still showing bilateral opacities  - S/p recent course of IV cefepime x 5 days per ID  - Discussed this recurrent event w/ GI and per GI, Unfortunately, his tracheoesophageal fistula is quite distal and with the stent in place, he could be having increased reflux events. Also, his esophagus was patulous and larger in diameter than the stent, so there may be some PO intake and/or refluxate slipping around the stent and passing through  the fistula. Unfortunately there is nothing further we can offer endoscopically to address this.  - He also was deemed earlier this admission to not be a surgical candidate (GI Dr. Tamea discussed with CT surgery)  - Most recently had MBS with SLP here on 10/22 -> is on soft and bite sized diet; however, GI is now suggesting a pureed and liquid diet with the hopes those pass through the stent more quickly with less opportunity to pass through the fistula - Continue PPI BID given high risk of reflux from position of the esophageal stent  - Per ID, they would not recommend suppressive antibiotics as this would likely cause more harm than good  - Monitor now off abx - his daughter met with palliative care on 08/31/2024 and has opted to pursue home with hospice so hospice has been consulted; she wishes to optimize comfort during the rest of his time in the hospital;   Bilateral pleural effusions, R>L Severe hypoalbuminemia: Had a CT chest at OSH that showed bilateral pleural effusions. Earlier this admission, he underwent R-sided thoracentesis on 10/16 with IR with removal of 1L of fluid. Cytology was negative for malignancy, culture negative, pleural fluid c/w transudative fluid (thought to be from low protein state). He did have an echocardiogram on 10/17 that showed normal EF 55-60%, normal wall motion, normal RV function, no significant valvular abnormalities, small pericardial effusion.  - CT chest was repeated on 10/28 here and still showed pretty significant R>L pleural effusions - S/p repeat R sided thoracentesis on 10/30 with removal of 730 mL  - Repeat pleural fluid studies most c/w exudative effusion, but culture negative and ANC low  - CXR 11/12 shows re-accumulation of right sided effusion so  he underwent repeat thoracentesis 11/13 with 1.2 L removed, f/u pleural fluid studies - previous pleural fluid studies have been suggestive of both transudate and exudate, perhaps related to third  spacing from hypoalbuminemia and PNA; he's not a good candidate for any type of surgical intervention and pleurX should be reserved for palliative situations; hopefully his effusion will re-accumulate less quickly with improved nutritional status; his BUN bumped after one dose of Lasix  Bilateral LE edema: Has mild LE edema in his feet bilaterally. Suspect this is related to severe hypoalbuminemia. Exam is not c/w DVT. He is on VTE ppx. TTE done this admission with normal EF.   Esophageal/gastric ulcers  Esophagitis: Had EGD at OSH on 9/16 showing esophageal ulcers, no stigmata of recent bleeding, ulcerated mucosa in the esophagus, c/f fistula, and gastric ulcers. H pylori was negative. He also underwent EGD on 10/13 here showing severely friable nodular esophagus with visible bronchial esophageal fistulous opening in the distal esophagus near the GE junction, esophageal stent was placed. Esophageal biopsy here showed moderate mixed gastroesophagitis. Negative for intestinal metaplasia, dysplasia and malignancy. It is a bit unclear why he has developed such severe esophagitis.  - BID PPI - Needs a total of 12 weeks since EGD at OSH  - Also now on Carafate  1 g nightly and pepcid  40 mg daily  Acute on chronic normocytic anemia: He required 3 units of pRBCs at the OSH, last on 10/4 prior to transfer. Iron studies at OSH showed low iron 13, TIBC 148, % sat 9, ferritin normal. Hgb here on arrival initially 12.6. Hgb then trended down to 6.5 on 10/25. He received 1 unit of pRBCs and since then, Hgb has been stable ~7-8. No active bleeding noted. EGD results as above.  - Continue to trend Hgb  - BID PPI  Hypothyroidism:  - Prior to this admission, he was taking Synthroid  88 mcg daily at home, but had a hx of non-compliance  - It is unclear what dose of Synthroid  he was getting at [facility]  - However on 10/10, his TSH was elevated to 45 and T4 low at 0.56  - Currently on Synthroid  100 mcg daily here  -  Repeat TFTs on 10/31 show TSH 22, but T4 is now normal  - Will continue Synthroid  at 100 mcg daily  - He will need repeat TFT's again in ~4 weeks to ensure TSH down-trends   Severe R ICA stenosis  Hx of CAD  Hx of remote CVA: CT neck on 10/19 incidentally showed severe R ICA origin stenosis. Carotid duplex on 10/23 showed >70% stenosis. Vascular surgery consulted -> no inpatient intervention needed, they will arrange f/u. Continue ASA 81 mg daily. Hold Lipitor given transaminitis.    Do recommend outpatient follow-up with vascular surgery within 2 days 3 weeks from hospital discharge.  Elevated transaminases: AST/ALT started trending up on 10/24, peaking at 154/114 on 10/27. T bili normal and alk phos normal. LFTs have since down-trended back to normal. He has no RUQ tenderness on exam. Low suspicion for any obx given normal T bili. This did occur shortly after he was started back on Lipitor, however, he was taking Lipitor 80 mg at home previously. Low suspicion this is from Lipitor. - Resumed Lipitor due to the severe R ICA stenosis as above  - hepatitis panel negative  - RUQ US  with normal liver, no masses or dilatation  - AST/ALT trended up again so Lipitor has been put on hold  - RUQ US   ordered but he refused it  - LFTs are starting to trend down, will stop lab checks since hospice is being pursued   Severe protein-calorie malnutrition:  - Continue pureed diet as above - PO intake is improving so TPN has been stopped   Constipation:  - Bowel regimen   Cognitive impairment: - OT attempted to perform MOCA but the patient refused to participate   Case management involved. Patient also evaluated by hospice [facility] and plan is for discharge home with hospice.  Discharge recommendations: Discharge home with hospice care [facility]. Follow-up with PCP within 3-5 days from hospital discharge. Follow-up with GI specialist [facility] within 2 weeks from hospital discharge. Follow-up with vascular  surgery specialist/Dr. Isidore within 3-4 weeks from hospital discharge. Obviously these appointments, family to determine if they wish to pursue them or not.   Consults:  IP CONSULT TO HOSPITALIST IP CONSULT TO CASE MANAGEMENT, RN/SW IP CONSULT TO IV TEAM IP CONSULT TO NUTRITION SERVICES IP CONSULT TO IV TEAM INPATIENT CONSULT TO WOUND OSTOMY IP CONSULT TO PULMONOLOGY IP CONSULT TO IV TEAM IP CONSULT TO IV TEAM IP CONSULT TO INFECTIOUS DISEASES IP CONSULT TO GASTROENTEROLOGY IP CONSULT TO NUTRITION SERVICES INPATIENT CONSULT TO WOUND OSTOMY INPATIENT CONSULT TO WOUND OSTOMY IP CONSULT TO PALLIATIVE CARE IP CONSULT TO CASE MANAGEMENT, RN/SW IP CONSULT TO CASE MANAGEMENT, RN/SW  Labs on Discharge:  Recent Labs    Units 08/31/24 0414 08/29/24 0413 08/28/24 0021 08/27/24 0333 08/26/24 0246  WBC thou/mcL 12.2* 14.3* 15.0* 12.6* 12.6*  HGB gm/dL 9.6* 9.6* 8.8* 8.7* 8.8*  HCT % 28.7* 28.4* 26.8* 27.1* 26.5*  PLT thou/mcL 293 307 308 321 328   Recent Labs    Units 08/31/24 0414 08/29/24 0413 08/28/24 0021 08/27/24 0333 08/26/24 0246  NA mmol/L 132* 133* 133* 133* 132*  K mmol/L 3.8 4.1 4.3 4.1 4.1  CL mmol/L 101 103 102 102 102  CO2 mmol/L 21 23 23 23 23   BUN mg/dL 21 26 37* 30* 33*  CREATININE mg/dL 9.44* 9.51* 9.39* 9.49* 0.47*  CALCIUM  mg/dL 7.8* 7.7* 7.5* 7.5* 7.5*   No results for input(s): TSH, HBA1C in the last 168 hours.   Recent Labs    Units 08/31/24 0414 08/29/24 0413 08/28/24 0021 08/27/24 0333 08/26/24 0246  BILITOT mg/dL 0.3 0.3 0.2 0.2 0.2  AST U/L 60* 112* 144* 124* 115*  ALT U/L 120* 171* 201* 188* 189*  ALKPHOS U/L 83 90 96 92 88  ALBUMIN gm/dL 2.3* 2.1* 2.2* 2.0* 1.9*   No results for input(s): LABPROT, INR, PTT in the last 168 hours.  No results for input(s): CHOL, LDL, HDL, TRIG in the last 168 hours.   Bedside Procedures   No orders found     Diagnostics:  XR Chest Ap Portable  Final Result  IMPRESSION:     Small pleural effusions with bilateral infiltrates similar to earlier the same day.    Electronically Signed by: Marinda Fleming, MD on 08/27/2024 11:49 PM    XR Chest Post Procedure  Final Result  IMPRESSION:  No pneumothorax post thoracentesis.    Electronically Signed by: Debby Chess, MD on 08/27/2024 12:42 PM    XR Chest Ap Portable  Final Result  Impression: Increased size of right pleural effusion.    Electronically Signed by: Lynwood Portugal on 08/27/2024 8:14 AM    US  Thoracentesis Right  Final Result  IMPRESSION: Successful diagnostic and therapeutic thoracentesis with ultrasound guided localization as described above.    Chyrl Lunger, PA-C with  supervising physician Dr. Alicia Davidson.    Electronically Signed by: Alicia Davidson, MD on 08/14/2024 3:35 PM    US  Abdomen Complete  Final Result  IMPRESSION:  Liver demonstrates normal echotexture. No masses or biliary dilatation.    Electronically Signed by: Charlie Redfern MD on 08/13/2024 3:40 PM    XR Chest Post Procedure  Final Result  IMPRESSION:  1.  No pneumothorax.  2.  Bilateral groundglass infiltrate and/or edema of both lungs with patchy atelectasis or infiltrate at the bases and small bilateral pleural effusions.        Electronically Signed by: Belvie Bath, MD on 08/13/2024 1:20 PM    CT Chest WO Contrast  Final Result  IMPRESSION:  1. No significant change bilateral pleural effusions and bibasilar infiltrate/atelectasis, right greater than left.  2. Mild increase in interstitial changes both lungs, which could be due to edema.  3. Esophageal stent in place.    Electronically Signed by: Reyes People, MD on 08/12/2024 11:13 AM    XR Chest Ap Portable  Final Result  Impression: There are bilateral airspace opacities with interstitial and central vascular prominence, right greater than left.    A right upper extremity PICC line catheter tip projects at the right atrium.    Electronically  Signed by: Rock Croft, MD on 08/09/2024 10:10 PM    CT Angio Neck  Final Result  IMPRESSION:    1.  Severe right cervical ICA stenosis from bulky atherosclerotic plaque.  2.  Suspected moderate stenosis of the bilateral vertebral artery origins, though detailed evaluation is degraded by motion.  3.  No hemodynamically-significant stenosis or occlusion of the left carotid artery.  4.  Similar right greater than left pleural effusions, partially visualized.    Electronically Signed by: Christopher Nguyen on 08/09/2024 2:10 PM    US  Carotid Bilateral  Final Result  IMPRESSION:  1. Significant stenosis right internal carotid artery, likely greater than 70%. CTA or MRA would be helpful in further evaluation. If clinically indicated, vascular surgery evaluation could be helpful.  2. No significant stenosis left carotid system.      Degree of stenosis is determined using NASCET measurement technique:  Severe:  70-90%  Moderate:  50-69%  Mild:  Less than 50%    Normal:  PSV < 125 cm/s with no visible plaque  Less than 50% stenosis: PSV < 180 cm/s   50-69% stenosis: PSV >= 180 cm/s  70-99% stenosis: PSV >= 230 cm/s AND EDV >= 100 cm/s  occluded: No visible flow in the ICA    For an internal carotid stenosis over 50%, a PSV >=180 cm/s has a positive predictive value of approximately 70% and a negative predictive value of approximately 96%.    For an internal carotid stenosis over 70%, a PSV >= 230 cm/s AND an EDV >= 100 cm/s has a positive predictive value of approximately 50% and a negative predictive value of approximately 96%.     Electronically Signed by: Reyes People, MD on 08/07/2024 3:59 PM    XR Abdomen Portable  Final Result  IMPRESSION:  Prior contrast within the colon. No dilated small bowel loops.    Electronically Signed by: Allison Kobus on 08/05/2024 2:28 PM    FL Esophagram Single Contrast  Final Result  IMPRESSION: Patent esophageal stent.    There is  contrast external to the stent in the distal esophagus which may be refluxing from the patient's known hiatal hernia. I do not see definite extravasation into the tracheobronchial  tree, and this may be within dilated distal esophagus and is similar appearance to the prior study.    Eccentric filling defect noted along the left side of the gastric fundus with delayed drainage, question whether the patient has had a previous Nissen fundoplication or extrinsic compression from diaphragm. This also appears similar to the previous CT and is of unclear etiology or significance. Correlate with endoscopic findings.    Electronically Signed by: Grayce Linear, MD on 08/03/2024 10:57 AM    CT Soft Tissue Neck W Contrast  Final Result  IMPRESSION:     1.  No acute soft tissue abnormality within the neck identified.   2.  No evidence of tracheoesophageal fistula.  3.  Severe right ICA origin stenosis from bulky atherosclerotic plaque.  4.  Remote-appearing infarcts within the partially visualized brain.  5.  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  6.  Moderate right and small left pleural effusions, partially visualized.    Electronically Signed by: Christopher Nguyen on 08/03/2024 8:22 AM    FL Esophagus Modified Barium Swallow  Final Result  IMPRESSION: Fluoroscopic-guided modified barium swallow as described above. For more details please see the speech therapist's report. Speech therapist is Lacinda Butler Chyrl Gladis, PA-C with supervising physician Dr. Reyes Luna.    Electronically Signed by: Reyes Luna, MD on 08/03/2024 12:46 PM    XR Chest Ap Portable  Final Result  IMPRESSION:    Pulmonary opacities and pleural effusions, not substantially changed.    Electronically Signed by: Christopher Nguyen on 07/31/2024 9:30 PM    Echocardiogram Complete WO Enhancing Agent  Final Result  Left Ventricle: EF: 55-60%. Wall motion  is normal.      XR Chest Ap Portable  Final Result  IMPRESSION:  No change bilateral infiltrates.    Electronically Signed by: Debby Chess, MD on 07/31/2024 10:54 AM    US  Thoracentesis Right  Final Result  IMPRESSION: Successful diagnostic and therapeutic thoracentesis with ultrasound guided localization as described above.    Chyrl Gladis, PA-C with supervising physician Dr. Reyes Luna.    Electronically Signed by: Reyes Luna, MD on 08/03/2024 12:49 PM    XR Chest Post Procedure  Final Result  IMPRESSION:    No pneumothorax.    Small pleural effusions with diffuse bilateral infiltrates similar to earlier the same day.    The PICC line its tip in the right atrium unchanged from earlier exam.    Electronically Signed by: Marinda Fleming, MD on 07/30/2024 3:10 PM    XR Chest Ap Portable  Final Result  IMPRESSION:    Slightly improved right pleural effusion and right-sided pulmonary opacities. Otherwise minimal change from prior exam.      Electronically Signed by: Massie Bracket on 07/30/2024 8:08 AM    FL Esophagram Single Contrast  Final Result  IMPRESSION:  No esophageal fistula or extravasation of contrast noted.    Electronically Signed by: Debby Chess, MD on 07/28/2024 4:53 PM    XR Chest Ap Portable  Final Result  IMPRESSION:    1. Small right pleural effusion. Placement of esophageal stent. Bilateral pulmonary opacities, right greater than left.      Electronically Signed by: Trenda Blanch, MD on 07/28/2024 2:16 PM    EGD  Final Result  Physician: Lennart JONELLE Sanders, MD    Procedure: EGD    Indication:   78 year old man transferred by an outside hospital for management of a  bronchial esophageal fistula.    Postprocedure Diagnosis:   Severely friable nodular esophagus with visible bronchial esophageal   fistulous opening in the distal esophagus near the GE junction. S/p   esophageal stent placement, suture fixation.         Recommendations:  Await biopsy results.  Monitor for improvement of fistula.    Findings:  Esophagus appeared diffusely barely friable and nodular.  Biopsies taken   with cold forceps.  In the distal esophagus, just proximal to the GE   junction, there was a several millimeter opening consistent with known   bronchial esophageal fistula.  Esophageal lumen appeared dilated in this   region.  The GE junction was significantly friable.  A small hiatal hernia   was noted.  The stomach and duodenum appeared grossly normal.    A Hemoclip was applied to the mucosa to mark the level of the fistula.    Under fluoroscopy, a 23 mm x 125 mm wall flex esophageal stent was placed.    The distal aspect bridged the GE junction.  We then used Apollo suture   fixation to secure the proximal aspect of the stent to the mucosa.    Patient tolerated procedure well.             Procedure Information     Procedure Details:   The patient underwent monitored anesthesia care, which was administered by   an anesthesia professional. The patient's blood pressure, ECG, level of   consciousness, oxygen saturation, ETCO2, heart rate and respirations were   monitored throughout the procedure. The scope was introduced through the   mouth and advanced to the third part of the duodenum. Retroflexion was   performed in the cardia, fundus and incisura. The patient's estimated   blood loss was minimal. The procedure was moderately difficult. The   patient tolerated the procedure well. There were no apparent adverse   events.       Medication Totals:  No administrations occurring from 1202 to 1412 on 07/27/24      Specimens:   ID Type Source Tests Collected by Time   1 : Esophageal Nodularity Tissue Esophagus, Biopsy PATHOLOGY TISSUE   REQUEST Lennart JONELLE Sanders, MD 07/27/2024 12:29 PM       Instrument/Scope:   * No instrument listed *       Implants         Implant Name    Manufacturer Lot No. LRB No.  Used Action Chg Desc    STENT WALLFLEX 23/28 X 87RF([id number]) - [id number] [[id number]] STENT   WALLFLEX 23/28 X 87RF([id number]) - [id number] [[id number]] [employer]   - MICROVASIV [[id number]] [id number] N/A [4] 1 Implanted [1] HC SUP -STENT    COATED COV W DEL SYS [[id number]]            .                    CT Outside Images Chest  Final Result    XR Chest Ap Portable  Final Result  IMPRESSION:  1. PICC line in good position.  2. Bilateral effusions  3. Bilateral infiltrate    Electronically Signed by: Glendia Guillaume, MD on 07/24/2024 6:07 PM    FL Esophageal Dilatation With Stent    (Results Pending)  US  Thoracentesis Right    (Results Pending)  US  Failed Study    (Results Pending)      [facility] Care   Discharge Procedure Orders  Follow up with Primary  Care Physician:  Standing Status: Future  Referral Priority: Urgent Referral Type: Consultation  Referral Reason: Evaluate and Return  Number of Visits Requested: 1 Expiration Date: 02/27/25   Ambulatory referral to Gastroenterology  Standing Status: Future  Referral Priority: Routine Referral Type: Consultation  Referral Reason: Evaluate and Return  Requested Specialty: Gastroenterology  Number of Visits Requested: 1 Expiration Date: 02/27/25   Ambulatory referral to Vascular Surgery  Standing Status: Future  Referral Priority: Routine Referral Type: Surgical  Referral Reason: Evaluate and Return  Requested Specialty: Vascular Surgery  Number of Visits Requested: 1 Expiration Date: 02/27/25   Regular Diet (2 gm sodium restriction)  Order Comments: Pured diet/4; thin liquids/0. Compensatory Swallowing Strategies: Upright as possible for oral intake.   Discharge instructions  Order Comments: Discharge home with hospice care [facility]. Follow-up with PCP within 3-5 days from hospital discharge. Follow-up with GI specialist [facility] within 2 weeks from hospital discharge. Follow-up with vascular surgery  specialist/Dr. Isidore within 3-4 weeks from hospital discharge. Obviously these appointments, family to determine if they wish to pursue them or not.    Follow up with Primary Care Physician:   Hospital follow-up  Eliezer Speedy, MD [address] [place] [address] [phone]     Ambulatory referral to Gastroenterology   Hospital follow-up  [facility] [address] [place] [address] [place]  [address] [phone]       Follow-up Appointments:  Future Appointments  Date Time Provider Department Center  02/09/2025  1:30 PM Marty MARLA Isidore, MD [facility] None      Potential for Rehab: Poor Code Status: No CPR  Time spent in discharge process: 55 minutes  Electronically signed: Alverda LITTIE Chancy, MD 09/01/2024 1:49 PM  *Some images could not be shown.

## 2024-07-29 ENCOUNTER — Telehealth: Payer: Self-pay

## 2024-07-29 NOTE — Transitions of Care (Post Inpatient/ED Visit) (Signed)
   07/29/2024  Name: Lee Wilson MRN: [id number] DOB: Jan 20, 1946  Today's TOC FU Call Status: Today's TOC FU Call Status:: Unsuccessful Call (3rd Attempt) Unsuccessful Call (3rd Attempt) Date: 07/29/24  Attempted to reach the patient regarding the most recent Inpatient/ED visit.  Follow Up Plan: No further outreach attempts will be made at this time. We have been unable to contact the patient.  Alan Ee, RN, BSN, CEN [employer]- Transition of Care Team.  [facility] [phone]

## 2024-07-30 ENCOUNTER — Other Ambulatory Visit: Payer: Self-pay

## 2024-08-19 ENCOUNTER — Encounter (INDEPENDENT_AMBULATORY_CARE_PROVIDER_SITE_OTHER): Payer: Self-pay | Admitting: Gastroenterology

## 2024-09-02 ENCOUNTER — Telehealth: Payer: Self-pay | Admitting: *Deleted

## 2024-09-02 NOTE — Transitions of Care (Post Inpatient/ED Visit) (Signed)
   09/02/2024  Name: TAMARIUS ROSENFIELD MRN: [id number] DOB: [birth year]  Today's TOC FU Call Status: Today's TOC FU Call Status:: Successful TOC FU Call Completed TOC FU Call Complete Date: 09/02/24  Patient's Name and Date of Birth confirmed. Name, DOB  Transition Care Management Follow-up Telephone Call Date of Discharge: 09/01/24 Discharge Facility: Other ([facility]) Name of Other ([facility]) [facility]charge Facility: [facility] Type of Discharge: Inpatient Admission Primary Inpatient Discharge Diagnosis:: tracheo-esophageal fistula Unable to reach pt by telephone, no answer and unable to leave voicemail.  TOC RN made outreach to verify receipt of hospice referral and start of care. RN confirmed that patient is actively enrolled in hospice program. Hospice Agency: [facility] RN spoke with: Rojelio Satchel Start of Care Date: 09/02/24 @ 830 am No further interventions at this time.     Mliss Creed [facility], BSN RN Care Manager/ Transition of Care [place] [phone]

## 2024-09-04 ENCOUNTER — Other Ambulatory Visit: Payer: Self-pay

## 2024-09-14 DEATH — deceased
